# Patient Record
Sex: Female | Born: 1955 | Race: Black or African American | Hispanic: No | Marital: Single | Smoking: Former smoker
Health system: Southern US, Community
[De-identification: ages and names within clinical notes are randomized; demographics above are authoritative.]

## PROBLEM LIST (undated history)

## (undated) DIAGNOSIS — E785 Hyperlipidemia, unspecified: Secondary | ICD-10-CM

## (undated) DIAGNOSIS — R51 Headache: Secondary | ICD-10-CM

## (undated) DIAGNOSIS — I10 Essential (primary) hypertension: Secondary | ICD-10-CM

## (undated) DIAGNOSIS — E119 Type 2 diabetes mellitus without complications: Secondary | ICD-10-CM

## (undated) DIAGNOSIS — R519 Headache, unspecified: Secondary | ICD-10-CM

## (undated) DIAGNOSIS — I2510 Atherosclerotic heart disease of native coronary artery without angina pectoris: Secondary | ICD-10-CM

## (undated) DIAGNOSIS — I509 Heart failure, unspecified: Secondary | ICD-10-CM

## (undated) DIAGNOSIS — K219 Gastro-esophageal reflux disease without esophagitis: Secondary | ICD-10-CM

## (undated) HISTORY — PX: ABDOMINAL HYSTERECTOMY: SHX81

## (undated) HISTORY — PX: CARDIAC CATHETERIZATION: SHX172

---

## 2005-05-20 ENCOUNTER — Inpatient Hospital Stay (HOSPITAL_COMMUNITY): Admission: AD | Admit: 2005-05-20 | Discharge: 2005-05-24 | Payer: Self-pay | Admitting: Internal Medicine

## 2005-06-22 ENCOUNTER — Encounter: Admission: RE | Admit: 2005-06-22 | Discharge: 2005-09-20 | Payer: Self-pay | Admitting: Internal Medicine

## 2005-08-11 ENCOUNTER — Encounter: Admission: RE | Admit: 2005-08-11 | Discharge: 2005-08-11 | Payer: Self-pay | Admitting: Family Medicine

## 2005-12-01 ENCOUNTER — Ambulatory Visit (HOSPITAL_COMMUNITY): Admission: RE | Admit: 2005-12-01 | Discharge: 2005-12-01 | Payer: Self-pay | Admitting: Gastroenterology

## 2005-12-01 ENCOUNTER — Encounter (INDEPENDENT_AMBULATORY_CARE_PROVIDER_SITE_OTHER): Payer: Self-pay | Admitting: *Deleted

## 2010-07-01 ENCOUNTER — Emergency Department (HOSPITAL_COMMUNITY): Admission: EM | Admit: 2010-07-01 | Discharge: 2010-07-01 | Payer: Self-pay | Admitting: Emergency Medicine

## 2011-02-06 LAB — POCT I-STAT, CHEM 8
BUN: 23 mg/dL (ref 6–23)
Calcium, Ion: 1.17 mmol/L (ref 1.12–1.32)
Chloride: 103 mEq/L (ref 96–112)
Creatinine, Ser: 0.9 mg/dL (ref 0.4–1.2)
Glucose, Bld: 357 mg/dL — ABNORMAL HIGH (ref 70–99)
HCT: 48 % — ABNORMAL HIGH (ref 36.0–46.0)
Hemoglobin: 16.3 g/dL — ABNORMAL HIGH (ref 12.0–15.0)
Potassium: 3.9 mEq/L (ref 3.5–5.1)
Sodium: 136 mEq/L (ref 135–145)
TCO2: 26 mmol/L (ref 0–100)

## 2011-02-06 LAB — GLUCOSE, CAPILLARY: Glucose-Capillary: 351 mg/dL — ABNORMAL HIGH (ref 70–99)

## 2011-04-10 NOTE — H&P (Signed)
[name], Roberts NO.:  [id number]   MEDICAL RECORD NO.:  [id number]          PATIENT TYPE:  INP   LOCATION:  3707                         FACILITY:  [facility]   PHYSICIAN:  Renato Battles, M.D.     DATE OF BIRTH:  03-05-1956   DATE OF ADMISSION:  05/20/2005  DATE OF DISCHARGE:                                HISTORY & PHYSICAL   REASON FOR ADMISSION:  Nausea, hyperglycemia, hypertension.   PRIMARY CARE PHYSICIAN:  Talmadge Coventry, M.D.   HISTORY OF PRESENT ILLNESS:  The patient is a very pleasant 55 year old  African-American female who has not been seeing a doctor for a long time,  recently about 10 days ago she went to see Dr. Smith Mince for the first time  regarding infected left middle finger.  At that time she was noted to have  hypertension and she was started on hydrochlorothiazide for that as well as  Celexa for that infection.  She comes back to see Dr. Smith Mince today and  she was noted to have a blood sugar of 606, blood pressure 190/100.  The  patient reports not feeling well in general, being nauseous but not much  vomiting.  She has lost about 8 pounds over the course of the last 10 days  according to the measurement in Dr. Stephannie Peters office, however, the patient  says that she has been losing weight for a longer period of time which she  attributed to stress from work.  She reports having polydipsia and polyuria,  having to wake up three to four times a night to urinate.  No dysuria, no  chest pain, no shortness of breath.  No orthopnea or PND.  No diarrhea or  constipation.  No hematuria.  No other complaints.   REVIEW OF SYSTEMS:  Otherwise negative.   PAST MEDICAL HISTORY:  These are all new issues, recently diagnosed.  1.  Hypertension.  2.  Diabetes.  3.  Obesity.  4.  Hyponatremia as reported by Dr. Stephannie Peters office, although I do not      have a confirmation lab here yet.  5.  Possible UTI.   PAST SURGICAL HISTORY:  The patient  had partial hysterectomy in 1993.  Also  she had cesarean section.   SOCIAL HISTORY:  She lives with her daughter.  She works in [employer].  She denies alcohol and denies drugs.  She does smoke on and  off, she reports that one pack of cigarettes last her for 2 to 3 weeks, but  has been doing that for years and years. She is very ambulatory, no problems  with mobility.   FAMILY HISTORY:  Positive for diabetes and cancer, mostly breast cancer.   ALLERGIES:  SULFA, SHELLFISH.   CURRENT MEDICATIONS:  1.  Hydrochlorothiazide 25 mg p.o. daily.  2.  Celexa 500 mg p.o. b.i.d.   PHYSICAL EXAMINATION:  GENERAL:  The patient is alert and oriented x3, very  well dressed and appropriate.  No acute distress.  VITAL SIGNS:  Temperature 98.9, heart rate 96, respiratory rate 20, blood  pressure 196/124.  Initial CBG  greater than 450.  HEENT:  Head is normocephalic and atraumatic.  Pupils equal, round, and  reactive to light and accommodation.  Extraocular movements intact  bilaterally.  NECK:  No lymphadenopathy, no thyromegaly, no JVD, no carotid bruits.  CHEST:  Clear to auscultation bilaterally.  No wheezing, rales, or rhonchi.  HEART:  Regular rate and tachycardia.  No murmurs.  ABDOMEN:  Soft, nontender, and nondistended.  Normal active bowel sounds.  EXTREMITIES:  No cyanosis, clubbing, or edema.   STUDIES:  No studies available at this time.   ASSESSMENT:  1.  Newly diagnosed diabetes with severe hyperglycemia.  At this time the      patient does not show any signs of hyperosmolar diabetic coma or DKA.  I      am going to start the patient on Glucomander and get her blood sugar      under control.  She is going to be on a diabetic diet.  Also diabetic      education and counseling will be obtained for her.  Once blood sugar is      under control, we will switch her to oral agents and sliding scale.  2.  Hypertension.  This partially related to chronic hypertension  and      partially to stress.  I am going to continue the patient on      hydrochlorothiazide and start her on Lisinopril 40 mg p.o. daily.  Blood      pressure will be checked q.4h and Hydralazine will be used for p.r.n.      coverage to keep blood pressure below 160 systolic and below 95      diastolic.  3.  Nausea.  This is very nonspecific and I am going to treat her      symptomatically.  4.  Weight loss.  Again I think this is related to the new onset diabetes.      I am just going to monitor the patient.  5.  Polyuria, polydipsia.  Again related to hyperglycemia.  I am going to      replace the patient's fluids with some IV fluids, but just monitor her      volume status.  6.  Tachycardia and feeling bag.  Given the patient's history of diabetes, I      am going to check cardiac enzymes and EKG.  7.  No health maintenance.  I am going to check fasting lipids, TSH, and      hemoglobin A1C.  8.  Hyponatremia.  This is probably pseudohyponatremia secondary to severe      hyperglycemia.  I am just going to recheck as blood sugars getting under      control.  9.  Possible urinary tract infection.  I am going to check urinalysis before      any treatment.       SA/MEDQ  D:  05/20/2005  T:  05/20/2005  Job:  [id number]   cc:   Talmadge Coventry, M.D.  [address]  [place]  [place] [address]  Fax: [phone]

## 2011-04-10 NOTE — Discharge Summary (Signed)
Rhonda Roberts, Rhonda Roberts NO.:  [id number]   MEDICAL RECORD NO.:  [id number]          PATIENT TYPE:  INP   LOCATION:  6707                         FACILITY:  [facility]   PHYSICIAN:  Michaelyn Barter, M.D. DATE OF BIRTH:  [birth year]   DATE OF ADMISSION:  05/20/2005  DATE OF DISCHARGE:  05/24/2005                                 DISCHARGE SUMMARY   PRIMARY CARE PHYSICIAN:  Katha Hamming, M.D.   FINAL DIAGNOSES:  1.  Newly diagnosed diabetes mellitus.  2.  Uncontrolled hypertension.  3.  Hypokalemia.  4.  Hyponatremia  5.  Hyperlipidemia.   HISTORY OF PRESENT ILLNESS:  Ms. Blatz is a 55 year old female who arrived  in the emergency room with a chief complaint of nausea. She stated that 10  days prior to her visit in the ER she saw her primary care physician for  evaluation of an infected left middle finger. Her blood pressure was noted  to be elevated and she was started on hydrochlorothiazide as well as Celexa.  She later went back to see her primary care doctor and it felt that her  glucose was elevated at 606 and her blood pressure likewise of elevated  190/100. The patient also complained of eight pounds weight loss  approximately 10 days prior to this admission. She also complained of some  polydipsia and polyuria.   PAST MEDICAL HISTORY:  1.  Hypertension.  2.  Diabetes mellitus.  3.  Obesity.  4.  Hyponatremia.  5.  Urinary tract infection   PAST SURGICAL HISTORY:  Partial hysterectomy in 1993.   SOCIAL HISTORY:  Cigarettes: The patient smokes on and off. Alcohol:  The  patient denies.   FAMILY HISTORY:  Positive for diabetes and cancer.   ALLERGIES:  SULFA and SHELLFISH.   HOSPITAL COURSE:  Problem 1:  NEWLY DIAGNOSED DIABETES MELLITUS WITH SEVERE  HYPERGLYCEMIA:  The patient's glucose was noted to have been elevated during  her initial presentation. Over the course of her hospitalization, she was  started on a sliding scale insulin and also  Lantus insulin which was given  at night time. When she visited her primary care physician, sugar was noted  to have been 606. Over the course of her hospitalization, her sugars  although they have declined secondary to the institution of Lantus there  have not been controlled. Lantus was initially started at 10 units q.h.s. As  of today, the dose has been titrated up to 26 units q.h.s. The patient's  sugars still remained in the low 200 range. There will have to be an  additional adjustment of the patient's medications by her primary care  physician once the patient is discharge from the hospital to achieve optimal  control of the patient's sugars. Likewise, the patient had a hemoglobin A1c  completed on May 20, 2005 the result of which was 14.4. The patient's  symptoms of nausea and vomiting have resolved, however.   Problem 2:  HYPERLIPIDEMIA:  The patient had a fasting lipid profile  completed on May 21, 2005, the results of which were a cholesterol of 228,  triglycerides 173,  HDL of 33, and LDL of 160; of course those results above  the recommend goals. Therefore, the patient has subsequently been started on  Zocor. In addition, she has also been started on an aspirin for cardiac  protection.   Problem 3:  HYPOKALEMIA:  During the patient's hospitalization, her  potassium was noted to have declined to 2.9. She has received multiple  supplementations for that and as of today her potassium level is 4.2 which  was completely within the normal range. In addition, the patient's blood  pressure at the time of admission was noted to have been elevated.  Subsequently, she was started on antihypertensive medications consisting  initially of hydrochlorothiazide but then lisinopril was added for optimal  blood pressure control and also to protect kidneys from diabetic  nephropathy. The patient's blood pressure has remained controlled secondary  to the addition of the lisinopril.   Problem  4:  HYPONATREMIA:  When the patient initially arrived in the  hospital, her sodium was noted to be 131. Over the course of  hospitalization, her sodium has increased to a normal low 135. Condition at  the time of discharge is improved. The patient states that she feels better  today and feels as though she is ready to be discharged home. Therefore, the  patient will be discharged home on the following medications.   DISCHARGE MEDICATIONS:  1.  Lantus insulin 30 units subcutaneously before bedtime.  2.  Lisinopril 40 milligrams p.o. q.d.  3.  Hydrochlorothiazide 25 milligrams p.o. q.d.  4.  Zocor 20 milligrams p.o. q.d.  5.  Aspirin 81 milligrams q.d.   DISCHARGE INSTRUCTIONS:  She is instructed to follow up with her primary  care doctor within 1-2 weeks for further evaluation and also for additional  adjustments to the patient's medications as although her glucose is  decreased from that result at the time of admission it is still not an  optimal number. Therefore, the patient will need adjustment in her  medications.       OR/MEDQ  D:  05/24/2005  T:  05/24/2005  Job:  [id number]   cc:   Katha Hamming, M.D.

## 2013-05-20 ENCOUNTER — Emergency Department (HOSPITAL_COMMUNITY)
Admission: EM | Admit: 2013-05-20 | Discharge: 2013-05-20 | Disposition: A | Discharge status: Home / Self Care | Payer: Managed Care, Other (non HMO) | Attending: Emergency Medicine | Admitting: Emergency Medicine

## 2013-05-20 ENCOUNTER — Emergency Department (HOSPITAL_COMMUNITY): Payer: Managed Care, Other (non HMO)

## 2013-05-20 ENCOUNTER — Encounter (HOSPITAL_COMMUNITY): Payer: Self-pay | Admitting: Emergency Medicine

## 2013-05-20 DIAGNOSIS — Z862 Personal history of diseases of the blood and blood-forming organs and certain disorders involving the immune mechanism: Secondary | ICD-10-CM | POA: Insufficient documentation

## 2013-05-20 DIAGNOSIS — Z8639 Personal history of other endocrine, nutritional and metabolic disease: Secondary | ICD-10-CM | POA: Insufficient documentation

## 2013-05-20 DIAGNOSIS — I10 Essential (primary) hypertension: Secondary | ICD-10-CM | POA: Insufficient documentation

## 2013-05-20 DIAGNOSIS — R109 Unspecified abdominal pain: Secondary | ICD-10-CM | POA: Insufficient documentation

## 2013-05-20 DIAGNOSIS — R110 Nausea: Secondary | ICD-10-CM | POA: Insufficient documentation

## 2013-05-20 DIAGNOSIS — E119 Type 2 diabetes mellitus without complications: Secondary | ICD-10-CM | POA: Insufficient documentation

## 2013-05-20 HISTORY — DX: Type 2 diabetes mellitus without complications: E11.9

## 2013-05-20 LAB — COMPREHENSIVE METABOLIC PANEL
ALT: 16 U/L (ref 0–35)
AST: 16 U/L (ref 0–37)
Albumin: 3.8 g/dL (ref 3.5–5.2)
Alkaline Phosphatase: 100 U/L (ref 39–117)
BUN: 23 mg/dL (ref 6–23)
CO2: 27 mEq/L (ref 19–32)
Calcium: 10 mg/dL (ref 8.4–10.5)
Chloride: 101 mEq/L (ref 96–112)
Creatinine, Ser: 0.9 mg/dL (ref 0.50–1.10)
GFR calc Af Amer: 81 mL/min — ABNORMAL LOW (ref >90)
GFR calc non Af Amer: 70 mL/min — ABNORMAL LOW (ref >90)
Glucose, Bld: 138 mg/dL — ABNORMAL HIGH (ref 70–99)
Potassium: 3.9 mEq/L (ref 3.5–5.1)
Sodium: 138 mEq/L (ref 135–145)
Total Bilirubin: 0.3 mg/dL (ref 0.3–1.2)
Total Protein: 8.4 g/dL — ABNORMAL HIGH (ref 6.0–8.3)

## 2013-05-20 LAB — CBC WITH DIFFERENTIAL/PLATELET
Basophils Absolute: 0 10*3/uL (ref 0.0–0.1)
Basophils Relative: 0 % (ref 0–1)
Eosinophils Absolute: 0.3 10*3/uL (ref 0.0–0.7)
Eosinophils Relative: 3 % (ref 0–5)
HCT: 36.6 % (ref 36.0–46.0)
Hemoglobin: 12.3 g/dL (ref 12.0–15.0)
Lymphocytes Relative: 26 % (ref 12–46)
Lymphs Abs: 2.9 10*3/uL (ref 0.7–4.0)
MCH: 28.8 pg (ref 26.0–34.0)
MCHC: 33.6 g/dL (ref 30.0–36.0)
MCV: 85.7 fL (ref 78.0–100.0)
Monocytes Absolute: 0.5 10*3/uL (ref 0.1–1.0)
Monocytes Relative: 5 % (ref 3–12)
Neutro Abs: 7.5 10*3/uL (ref 1.7–7.7)
Neutrophils Relative %: 66 % (ref 43–77)
Platelets: 271 10*3/uL (ref 150–400)
RBC: 4.27 MIL/uL (ref 3.87–5.11)
RDW: 14.5 % (ref 11.5–15.5)
WBC: 11.3 10*3/uL — ABNORMAL HIGH (ref 4.0–10.5)

## 2013-05-20 LAB — URINALYSIS, ROUTINE W REFLEX MICROSCOPIC
Bilirubin Urine: NEGATIVE
Glucose, UA: NEGATIVE mg/dL
Hgb urine dipstick: NEGATIVE
Ketones, ur: NEGATIVE mg/dL
Nitrite: NEGATIVE
Protein, ur: NEGATIVE mg/dL
Specific Gravity, Urine: 1.016 (ref 1.005–1.030)
Urobilinogen, UA: 0.2 mg/dL (ref 0.0–1.0)
pH: 5.5 (ref 5.0–8.0)

## 2013-05-20 LAB — URINE MICROSCOPIC-ADD ON

## 2013-05-20 LAB — LIPASE, BLOOD: Lipase: 29 U/L (ref 11–59)

## 2013-05-20 MED ORDER — MORPHINE SULFATE 4 MG/ML IJ SOLN
4.0000 mg | Freq: Once | INTRAMUSCULAR | Status: AC
  Administered 2013-05-20: 4 mg via INTRAVENOUS
  Filled 2013-05-20: qty 1

## 2013-05-20 MED ORDER — IOHEXOL 300 MG/ML  SOLN
50.0000 mL | Freq: Once | INTRAMUSCULAR | Status: AC | PRN
  Administered 2013-05-20: 50 mL via ORAL

## 2013-05-20 MED ORDER — ONDANSETRON HCL 4 MG/2ML IJ SOLN
4.0000 mg | Freq: Once | INTRAMUSCULAR | Status: AC
  Administered 2013-05-20: 4 mg via INTRAVENOUS
  Filled 2013-05-20: qty 2

## 2013-05-20 MED ORDER — IOHEXOL 300 MG/ML  SOLN
100.0000 mL | Freq: Once | INTRAMUSCULAR | Status: AC | PRN
  Administered 2013-05-20: 100 mL via INTRAVENOUS

## 2013-05-20 MED ORDER — HYDROCODONE-ACETAMINOPHEN 5-325 MG PO TABS: 2.0000 | ORAL_TABLET | Freq: Four times a day (QID) | ORAL | Status: DC | PRN

## 2013-05-20 MED ORDER — PROMETHAZINE HCL 25 MG PO TABS: 25.0000 mg | ORAL_TABLET | Freq: Four times a day (QID) | ORAL | Status: DC | PRN

## 2013-05-20 NOTE — ED Notes (Signed)
Patient up to bathroom for urine sample

## 2013-05-20 NOTE — ED Provider Notes (Signed)
History    CSN: [id number] Arrival date & time 05/20/13  1454  First MD Initiated Contact with Patient 05/20/13 1634     Chief Complaint  Patient presents with  . Abdominal Pain   (Consider location/radiation/quality/duration/timing/severity/associated sxs/prior Treatment) HPI Comments: Patient is a 57 year old female with history of diabetes, hypertension, hyperlipidemia who presents today with 3 days of gradually worsening throbbing upper abdomen no pain. It is worse on her left side with radiation to her back. The pain also radiates into her right side. It is associated with nausea. No vomiting, diarrhea, constipation. Last bowel movement was this morning. Her diabetes is well controlled. She measures her blood sugars twice daily and states that her sugars are generally in the low 100s. She has never had pain like this before. No medications prior to arrival. No fevers, but she states she has been cold for the past 3 days.   The history is provided by the patient. No language interpreter was used.   Past Medical History  Diagnosis Date  . Diabetes mellitus without complication    Past Surgical History  Procedure Laterality Date  . Abdominal hysterectomy     No family history on file. History  Substance Use Topics  . Smoking status: Never Smoker   . Smokeless tobacco: Not on file  . Alcohol Use: No   OB History   Grav Para Term Preterm Abortions TAB SAB Ect Mult Living                 Review of Systems  Constitutional: Negative for fever and chills.  Respiratory: Negative for shortness of breath.   Cardiovascular: Negative for chest pain.  Gastrointestinal: Positive for nausea and abdominal pain. Negative for vomiting, diarrhea and constipation.  All other systems reviewed and are negative.    Allergies  Sulfa antibiotics  Home Medications  No current outpatient prescriptions on file. BP 143/72  Pulse 88  Temp(Src) 98.4 F (36.9 C) (Oral)  Resp 19  Ht 5\' 2"   (1.575 m)  Wt 274 lb (124.286 kg)  BMI 50.1 kg/m2  SpO2 98% Physical Exam  Nursing note and vitals reviewed. Constitutional: She is oriented to person, place, and time. She appears well-developed and well-nourished. No distress.  HENT:  Head: Normocephalic and atraumatic.  Right Ear: External ear normal.  Left Ear: External ear normal.  Nose: Nose normal.  Mouth/Throat: Oropharynx is clear and moist.  Eyes: Conjunctivae are normal.  Neck: Normal range of motion.  Cardiovascular: Normal rate, regular rhythm and normal heart sounds.   Pulmonary/Chest: Effort normal and breath sounds normal. No stridor. No respiratory distress. She has no wheezes. She has no rales.  Abdominal: Soft. She exhibits no distension. There is tenderness in the right upper quadrant, epigastric area and left upper quadrant. There is no rigidity, no rebound and no guarding.  Musculoskeletal: Normal range of motion.  Neurological: She is alert and oriented to person, place, and time. She has normal strength.  Skin: Skin is warm and dry. She is not diaphoretic. No erythema.  Psychiatric: She has a normal mood and affect. Her behavior is normal.    ED Course  Procedures (including critical care time) Labs Reviewed  CBC WITH DIFFERENTIAL - Abnormal; Notable for the following:    WBC 11.3 (*)    All other components within normal limits  COMPREHENSIVE METABOLIC PANEL - Abnormal; Notable for the following:    Glucose, Bld 138 (*)    Total Protein 8.4 (*)  GFR calc non Af Amer 70 (*)    GFR calc Af Amer 81 (*)    All other components within normal limits  URINALYSIS, ROUTINE W REFLEX MICROSCOPIC - Abnormal; Notable for the following:    Leukocytes, UA SMALL (*)    All other components within normal limits  URINE MICROSCOPIC-ADD ON - Abnormal; Notable for the following:    Bacteria, UA MANY (*)    All other components within normal limits  LIPASE, BLOOD   Ct Abdomen Pelvis W Contrast  05/20/2013    *RADIOLOGY REPORT*  Clinical Data: Generalized abdominal pain for 3 days  CT ABDOMEN AND PELVIS WITH CONTRAST  Technique:  Multidetector CT imaging of the abdomen and pelvis was performed following the standard protocol during bolus administration of intravenous contrast.  Contrast: 50mL OMNIPAQUE IOHEXOL 300 MG/ML  SOLN, OMNIPAQUE IOHEXOL 300 MG/ML  SOLN  Comparison: None.  Findings:  Normal hepatic contour.  No discrete hepatic lesions.  Normal appearance of the gallbladder.  No intra or extrahepatic biliary duct dilatation.  No ascites.  There is symmetric enhancement and excretion of the bilateral kidneys.  Incidental note is made of a slightly exaggerated horizontal line of the right kidney.  No discrete renal lesions. No renal stones urinary obstruction.  No perinephric stranding. Normal appearance of the bilateral adrenal glands, pancreas and spleen.  Ingested enteric contrast extends to the level of the proximal descending colon.  Scattered colonic diverticulosis without evidence of diverticulitis.  The bowel is otherwise normal in course and caliber without wall thickening or evidence of obstruction.  Normal appearance of the appendix.  No pneumoperitoneum, pneumatosis or portal venous gas.  Scattered minimal atherosclerotic plaque within a mildly tortuous but normal caliber abdominal aorta.  No retroperitoneal, mesenteric, pelvic or inguinal lymphadenopathy.  Post hysterectomy.  No discrete adnexal lesion.  No free fluid in the pelvis.  Limited visualization of the lower thorax is negative for focal airspace opacity or pleural effusion.  No acute or aggressive osseous abnormalities.  Mild multilevel lumbar spine DDD, likely worse at L3 - L4.  There is minimal subcutaneous stranding in the right lower abdominal quadrant, presumably at the site of prior subcutaneous injections.  Note is made of a small mesenteric fat containing periumbilical hernia.  IMPRESSION: No explanation for patient's generalized  abdominal pain. Specifically, no evidence of enteric or urinary obstruction.   Original Report Authenticated By: Tacey Ruiz, MD   1. Abdominal pain     MDM  Patient is nontoxic, nonseptic appearing, in no apparent distress.  Patient's pain and other symptoms adequately managed in emergency department. Labs, imaging and vitals reviewed.  Patient does not meet the SIRS or Sepsis criteria.  On repeat exam patient does not have a surgical abdomin and there are nor peritoneal signs.  No indication of appendicitis, bowel obstruction, bowel perforation, cholecystitis, diverticulitis.  Patient discharged home with symptomatic treatment and given strict instructions for follow-up with their primary care physician.  I have also discussed reasons to return immediately to the ER.  Patient expresses understanding and agrees with plan. Discussed case with Dr. Oletta Lamas who agrees with plan.      Mora Bellman, PA-C 05/21/13 [phone]

## 2013-05-20 NOTE — ED Notes (Signed)
Pt states she has been having gen abd pain x 3 days.  Denies NVD.

## 2013-05-21 NOTE — ED Provider Notes (Signed)
Medical screening examination/treatment/procedure(s) were performed by non-physician practitioner and as supervising physician I was immediately available for consultation/collaboration.   Gavin Pound. Verlon Carcione, MD 05/21/13 0040

## 2013-05-24 ENCOUNTER — Encounter (HOSPITAL_COMMUNITY): Payer: Self-pay | Admitting: Emergency Medicine

## 2013-05-24 ENCOUNTER — Emergency Department (HOSPITAL_COMMUNITY)
Admission: EM | Admit: 2013-05-24 | Discharge: 2013-05-24 | Disposition: A | Discharge status: Home / Self Care | Payer: Managed Care, Other (non HMO) | Attending: Emergency Medicine | Admitting: Emergency Medicine

## 2013-05-24 DIAGNOSIS — Z794 Long term (current) use of insulin: Secondary | ICD-10-CM | POA: Insufficient documentation

## 2013-05-24 DIAGNOSIS — Z79899 Other long term (current) drug therapy: Secondary | ICD-10-CM | POA: Insufficient documentation

## 2013-05-24 DIAGNOSIS — R112 Nausea with vomiting, unspecified: Secondary | ICD-10-CM | POA: Insufficient documentation

## 2013-05-24 DIAGNOSIS — B029 Zoster without complications: Secondary | ICD-10-CM | POA: Insufficient documentation

## 2013-05-24 DIAGNOSIS — E119 Type 2 diabetes mellitus without complications: Secondary | ICD-10-CM | POA: Insufficient documentation

## 2013-05-24 DIAGNOSIS — Z7982 Long term (current) use of aspirin: Secondary | ICD-10-CM | POA: Insufficient documentation

## 2013-05-24 MED ORDER — VALACYCLOVIR HCL 1 G PO TABS: 1000.0000 mg | ORAL_TABLET | Freq: Three times a day (TID) | ORAL | Status: DC

## 2013-05-24 MED ORDER — ONDANSETRON 8 MG PO TBDP: ORAL_TABLET | ORAL | Status: DC

## 2013-05-24 MED ORDER — OXYCODONE-ACETAMINOPHEN 5-325 MG PO TABS: 2.0000 | ORAL_TABLET | ORAL | Status: DC | PRN

## 2013-05-24 NOTE — Progress Notes (Signed)
Prior to d/c pt confirmed pcp as Kayren Eaves, NP at [facility] EPIC updated

## 2013-05-24 NOTE — ED Notes (Addendum)
Pt reports being seen on 6/28 for abdominal pain. Pt developed a rash and raised bumps that are fluid filled that "itch, burn, and ache." Pt reports only having the rash and bumps around her trunk and back area. NAD.

## 2013-05-24 NOTE — ED Provider Notes (Signed)
History    CSN: [id number] Arrival date & time 05/24/13  1053  First MD Initiated Contact with Patient 05/24/13 1119     Chief Complaint  Patient presents with  . Skin Problem   (Consider location/radiation/quality/duration/timing/severity/associated sxs/prior Treatment) HPI Comments: Patient presents with a rash to her lower abdomen. She states she started feeling 1018 2 days ago and then yesterday noted some blisters starting. She states the rash starts in her abdomen and radiates to her left back. She's had some nausea and vomiting associated with the pain. She states it's a constant burning pain but intensifies at times. She's had no fevers or chills.  Past Medical History  Diagnosis Date  . Diabetes mellitus without complication    Past Surgical History  Procedure Laterality Date  . Abdominal hysterectomy     No family history on file. History  Substance Use Topics  . Smoking status: Never Smoker   . Smokeless tobacco: Never Used  . Alcohol Use: No   OB History   Grav Para Term Preterm Abortions TAB SAB Ect Mult Living                 Review of Systems  Constitutional: Negative for fever, chills, diaphoresis and fatigue.  HENT: Negative for congestion, rhinorrhea and sneezing.   Eyes: Negative.   Respiratory: Negative for cough, chest tightness and shortness of breath.   Cardiovascular: Negative for chest pain and leg swelling.  Gastrointestinal: Positive for nausea and vomiting. Negative for abdominal pain, diarrhea and blood in stool.  Genitourinary: Negative for frequency, hematuria, flank pain and difficulty urinating.  Musculoskeletal: Negative for back pain and arthralgias.  Skin: Positive for rash.  Neurological: Negative for dizziness, speech difficulty, weakness, numbness and headaches.    Allergies  Sulfa antibiotics  Home Medications   Current Outpatient Rx  Name  Route  Sig  Dispense  Refill  . aspirin EC 81 MG tablet   Oral   Take 81 mg by  mouth every morning.         Marland Kitchen atorvastatin (LIPITOR) 20 MG tablet   Oral   Take 20 mg by mouth every evening.         . Cholecalciferol (VITAMIN D) 2000 UNITS CAPS   Oral   Take 1 capsule by mouth every morning.         Marland Kitchen HYDROcodone-acetaminophen (NORCO/VICODIN) 5-325 MG per tablet   Oral   Take 2 tablets by mouth every 6 (six) hours as needed for pain.   6 tablet   0   . insulin glargine (LANTUS) 100 UNIT/ML injection   Subcutaneous   Inject 30-45 Units into the skin 2 (two) times daily. SSI: based on blood sugar levels         . Multiple Vitamin (MULTIVITAMIN WITH MINERALS) TABS   Oral   Take 1 tablet by mouth every morning.         . Olmesartan-Amlodipine-HCTZ (TRIBENZOR) 40-5-25 MG TABS   Oral   Take 1 tablet by mouth every evening.         . ondansetron (ZOFRAN ODT) 8 MG disintegrating tablet      8mg  ODT q4 hours prn nausea   10 tablet   0   . oxyCODONE-acetaminophen (PERCOCET) 5-325 MG per tablet   Oral   Take 2 tablets by mouth every 4 (four) hours as needed for pain.   20 tablet   0   . promethazine (PHENERGAN) 25 MG tablet   Oral  Take 1 tablet (25 mg total) by mouth every 6 (six) hours as needed for nausea.   12 tablet   0   . SitaGLIPtin-MetFORMIN HCl [phone] MG TB24   Oral   Take 1 tablet by mouth every evening.         . valACYclovir (VALTREX) 1000 MG tablet   Oral   Take 1 tablet (1,000 mg total) by mouth 3 (three) times daily.   21 tablet   0    BP 141/88  Pulse 97  Temp(Src) 98.5 F (36.9 C) (Oral)  Resp 18  SpO2 99% Physical Exam  Constitutional: She is oriented to person, place, and time. She appears well-developed and well-nourished.  HENT:  Head: Normocephalic and atraumatic.  Eyes: Pupils are equal, round, and reactive to light.  Neck: Normal range of motion. Neck supple.  Cardiovascular: Normal rate, regular rhythm and normal heart sounds.   Pulmonary/Chest: Effort normal and breath sounds normal. No  respiratory distress. She has no wheezes. She has no rales. She exhibits no tenderness.  Abdominal: Soft. Bowel sounds are normal. There is no tenderness. There is no rebound and no guarding.  Musculoskeletal: Normal range of motion. She exhibits no edema.  Lymphadenopathy:    She has no cervical adenopathy.  Neurological: She is alert and oriented to person, place, and time.  Skin: Skin is warm and dry. Rash noted.  Patient has a vesicular erythematous rash to her left midabdomen it radiates around to her left mid back. It did not cross the midline. It appears to be consistent with shingles. There's no purulent discharge or other signs of infection.  Psychiatric: She has a normal mood and affect.    ED Course  Procedures (including critical care time) Labs Reviewed - No data to display No results found. 1. Shingles     MDM  Patient was started on Valtrex.  She was previously taking hydrocodone which does not seem to be helping with the pain. I gave her prescription for oxycodone. I advised her to take his stool soft with it. I also gave her a prescription for Zofran for nausea. Advised her followup with her primary care physician within the next few days for recheck. I advised her not to take hydrocodone along with oxycodone.  Rolan Bucco, MD 05/24/13 1151

## 2013-12-13 ENCOUNTER — Other Ambulatory Visit: Payer: Self-pay | Admitting: Family

## 2013-12-13 ENCOUNTER — Ambulatory Visit
Admission: RE | Admit: 2013-12-13 | Discharge: 2013-12-13 | Disposition: A | Discharge status: Home / Self Care | Payer: Managed Care, Other (non HMO) | Source: Ambulatory Visit | Attending: Family | Admitting: Family

## 2013-12-13 DIAGNOSIS — M25562 Pain in left knee: Secondary | ICD-10-CM

## 2013-12-13 DIAGNOSIS — M549 Dorsalgia, unspecified: Secondary | ICD-10-CM

## 2013-12-13 DIAGNOSIS — M25561 Pain in right knee: Secondary | ICD-10-CM

## 2014-03-30 ENCOUNTER — Encounter (HOSPITAL_COMMUNITY): Payer: Self-pay | Admitting: Emergency Medicine

## 2014-03-30 ENCOUNTER — Emergency Department (HOSPITAL_COMMUNITY)
Admission: EM | Admit: 2014-03-30 | Discharge: 2014-03-30 | Disposition: A | Discharge status: Home / Self Care | Payer: Managed Care, Other (non HMO) | Source: Home / Self Care | Attending: Emergency Medicine | Admitting: Emergency Medicine

## 2014-03-30 DIAGNOSIS — N390 Urinary tract infection, site not specified: Secondary | ICD-10-CM

## 2014-03-30 LAB — POCT URINALYSIS DIP (DEVICE)
Bilirubin Urine: NEGATIVE
Glucose, UA: NEGATIVE mg/dL
Ketones, ur: NEGATIVE mg/dL
Nitrite: NEGATIVE
Protein, ur: NEGATIVE mg/dL
Specific Gravity, Urine: 1.02 (ref 1.005–1.030)
Urobilinogen, UA: 0.2 mg/dL (ref 0.0–1.0)
pH: 6 (ref 5.0–8.0)

## 2014-03-30 MED ORDER — NITROFURANTOIN MONOHYD MACRO 100 MG PO CAPS: 100.0000 mg | ORAL_CAPSULE | Freq: Two times a day (BID) | ORAL | Status: DC

## 2014-03-30 NOTE — ED Provider Notes (Signed)
CSN: [id number]     Arrival date & time 03/30/14  1914 History   First MD Initiated Contact with Patient 03/30/14 1956     Chief Complaint  Patient presents with  . Urinary Tract Infection   (Consider location/radiation/quality/duration/timing/severity/associated sxs/prior Treatment) HPI Comments: Patient presents with a 3 day history of dysuria, bladder pressure post void. No fever or chills. No gross hematuria. No CVA or abdominal pain.   Patient is a 58 y.o. female presenting with urinary tract infection. The history is provided by the patient.  Urinary Tract Infection    Past Medical History  Diagnosis Date  . Diabetes mellitus without complication    Past Surgical History  Procedure Laterality Date  . Abdominal hysterectomy     No family history on file. History  Substance Use Topics  . Smoking status: Never Smoker   . Smokeless tobacco: Never Used  . Alcohol Use: No   OB History   Grav Para Term Preterm Abortions TAB SAB Ect Mult Living                 Review of Systems  All other systems reviewed and are negative.   Allergies  Sulfa antibiotics  Home Medications   Prior to Admission medications   Medication Sig Start Date End Date Taking? Authorizing Provider  aspirin EC 81 MG tablet Take 81 mg by mouth every morning.    Historical Provider, MD  atorvastatin (LIPITOR) 20 MG tablet Take 20 mg by mouth every evening.    Historical Provider, MD  Cholecalciferol (VITAMIN D) 2000 UNITS CAPS Take 1 capsule by mouth every morning.    Historical Provider, MD  HYDROcodone-acetaminophen (NORCO/VICODIN) 5-325 MG per tablet Take 2 tablets by mouth every 6 (six) hours as needed for pain. 05/20/13   Elwyn Lade, PA-C  insulin glargine (LANTUS) 100 UNIT/ML injection Inject 30-45 Units into the skin 2 (two) times daily. SSI: based on blood sugar levels    Historical Provider, MD  Multiple Vitamin (MULTIVITAMIN WITH MINERALS) TABS Take 1 tablet by mouth every morning.     Historical Provider, MD  Olmesartan-Amlodipine-HCTZ (TRIBENZOR) 40-5-25 MG TABS Take 1 tablet by mouth every evening.    Historical Provider, MD  ondansetron (ZOFRAN ODT) 8 MG disintegrating tablet 8mg  ODT q4 hours prn nausea 05/24/13   Malvin Johns, MD  oxyCODONE-acetaminophen (PERCOCET) 5-325 MG per tablet Take 2 tablets by mouth every 4 (four) hours as needed for pain. 05/24/13   Malvin Johns, MD  promethazine (PHENERGAN) 25 MG tablet Take 1 tablet (25 mg total) by mouth every 6 (six) hours as needed for nausea. 05/20/13   Elwyn Lade, PA-C  SitaGLIPtin-MetFORMIN HCl [phone] MG TB24 Take 1 tablet by mouth every evening.    Historical Provider, MD  valACYclovir (VALTREX) 1000 MG tablet Take 1 tablet (1,000 mg total) by mouth 3 (three) times daily. 05/24/13   Malvin Johns, MD   BP 165/87  Pulse 90  Temp(Src) 98.6 F (37 C) (Oral)  Resp 20  SpO2 98% Physical Exam  Nursing note and vitals reviewed. Constitutional: She is oriented to person, place, and time. She appears well-developed and well-nourished. No distress.  HENT:  Head: Normocephalic and atraumatic.  Cardiovascular: Normal rate and regular rhythm.   Pulmonary/Chest: Effort normal and breath sounds normal.  Abdominal: Soft. Bowel sounds are normal. She exhibits no distension. There is no rebound.  Neurological: She is alert and oriented to person, place, and time.  Skin: Skin is warm and dry.  Psychiatric: Her behavior is normal.    ED Course  Procedures (including critical care time) Labs Review Labs Reviewed  POCT URINALYSIS DIP (DEVICE) - Abnormal; Notable for the following:    Hgb urine dipstick TRACE (*)    Leukocytes, UA TRACE (*)    All other components within normal limits    Imaging Review No results found.   MDM   1. UTI (lower urinary tract infection)    Cover with antibiotics, push fluids. F/U if needed.     Bjorn Pippin, PA-C 03/30/14 2012

## 2014-03-30 NOTE — Discharge Instructions (Signed)
Urinary Tract Infection Urinary tract infections (UTIs) can develop anywhere along your urinary tract. Your urinary tract is your body's drainage system for removing wastes and extra water. Your urinary tract includes two kidneys, two ureters, a bladder, and a urethra. Your kidneys are a pair of bean-shaped organs. Each kidney is about the size of your fist. They are located below your ribs, one on each side of your spine. CAUSES Infections are caused by microbes, which are microscopic organisms, including fungi, viruses, and bacteria. These organisms are so small that they can only be seen through a microscope. Bacteria are the microbes that most commonly cause UTIs. SYMPTOMS  Symptoms of UTIs may vary by age and gender of the patient and by the location of the infection. Symptoms in [place] women typically include a frequent and intense urge to urinate and a painful, burning feeling in the bladder or urethra during urination. Older women and men are more likely to be tired, shaky, and weak and have muscle aches and abdominal pain. A fever may mean the infection is in your kidneys. Other symptoms of a kidney infection include pain in your back or sides below the ribs, nausea, and vomiting. DIAGNOSIS To diagnose a UTI, your caregiver will ask you about your symptoms. Your caregiver also will ask to provide a urine sample. The urine sample will be tested for bacteria and white blood cells. White blood cells are made by your body to help fight infection. TREATMENT  Typically, UTIs can be treated with medication. Because most UTIs are caused by a bacterial infection, they usually can be treated with the use of antibiotics. The choice of antibiotic and length of treatment depend on your symptoms and the type of bacteria causing your infection. HOME CARE INSTRUCTIONS  If you were prescribed antibiotics, take them exactly as your caregiver instructs you. Finish the medication even if you feel better after you  have only taken some of the medication.  Drink enough water and fluids to keep your urine clear or pale yellow.  Avoid caffeine, tea, and carbonated beverages. They tend to irritate your bladder.  Empty your bladder often. Avoid holding urine for long periods of time.  Empty your bladder before and after sexual intercourse.  After a bowel movement, women should cleanse from front to back. Use each tissue only once. SEEK MEDICAL CARE IF:   You have back pain.  You develop a fever.  Your symptoms do not begin to resolve within 3 days. SEEK IMMEDIATE MEDICAL CARE IF:   You have severe back pain or lower abdominal pain.  You develop chills.  You have nausea or vomiting.  You have continued burning or discomfort with urination. MAKE SURE YOU:   Understand these instructions.  Will watch your condition.  Will get help right away if you are not doing well or get worse. Document Released: 08/19/2005 Document Revised: 05/10/2012 Document Reviewed: 12/18/2011 [facility] Patient Information 2014 [place].   Drink lots of water, take all of antibiotics. Should you worsen or not improve please f/u with your PCP. Feel better.

## 2014-03-31 NOTE — ED Provider Notes (Signed)
Medical screening examination/treatment/procedure(s) were performed by non-physician practitioner and as supervising physician I was immediately available for consultation/collaboration.  Philipp Deputy, M.D.  Harden Mo, MD 03/31/14 [phone]

## 2015-02-28 ENCOUNTER — Emergency Department (HOSPITAL_COMMUNITY): Payer: BLUE CROSS/BLUE SHIELD

## 2015-02-28 ENCOUNTER — Emergency Department (HOSPITAL_COMMUNITY)
Admission: EM | Admit: 2015-02-28 | Discharge: 2015-02-28 | Disposition: A | Discharge status: Home / Self Care | Payer: BLUE CROSS/BLUE SHIELD | Attending: Emergency Medicine | Admitting: Emergency Medicine

## 2015-02-28 ENCOUNTER — Encounter (HOSPITAL_COMMUNITY): Payer: Self-pay | Admitting: Emergency Medicine

## 2015-02-28 DIAGNOSIS — Z79899 Other long term (current) drug therapy: Secondary | ICD-10-CM | POA: Insufficient documentation

## 2015-02-28 DIAGNOSIS — R109 Unspecified abdominal pain: Secondary | ICD-10-CM

## 2015-02-28 DIAGNOSIS — E119 Type 2 diabetes mellitus without complications: Secondary | ICD-10-CM | POA: Insufficient documentation

## 2015-02-28 DIAGNOSIS — R42 Dizziness and giddiness: Secondary | ICD-10-CM | POA: Diagnosis not present

## 2015-02-28 DIAGNOSIS — Z794 Long term (current) use of insulin: Secondary | ICD-10-CM | POA: Insufficient documentation

## 2015-02-28 DIAGNOSIS — N12 Tubulo-interstitial nephritis, not specified as acute or chronic: Secondary | ICD-10-CM | POA: Diagnosis not present

## 2015-02-28 DIAGNOSIS — Z90710 Acquired absence of both cervix and uterus: Secondary | ICD-10-CM | POA: Diagnosis not present

## 2015-02-28 DIAGNOSIS — Z7982 Long term (current) use of aspirin: Secondary | ICD-10-CM | POA: Insufficient documentation

## 2015-02-28 DIAGNOSIS — R1012 Left upper quadrant pain: Secondary | ICD-10-CM | POA: Diagnosis present

## 2015-02-28 LAB — CBC WITH DIFFERENTIAL/PLATELET
Basophils Absolute: 0 10*3/uL (ref 0.0–0.1)
Basophils Relative: 0 % (ref 0–1)
Eosinophils Absolute: 0.3 10*3/uL (ref 0.0–0.7)
Eosinophils Relative: 3 % (ref 0–5)
HCT: 38.5 % (ref 36.0–46.0)
Hemoglobin: 12.8 g/dL (ref 12.0–15.0)
Lymphocytes Relative: 26 % (ref 12–46)
Lymphs Abs: 3.1 10*3/uL (ref 0.7–4.0)
MCH: 28.1 pg (ref 26.0–34.0)
MCHC: 33.2 g/dL (ref 30.0–36.0)
MCV: 84.4 fL (ref 78.0–100.0)
Monocytes Absolute: 0.6 10*3/uL (ref 0.1–1.0)
Monocytes Relative: 5 % (ref 3–12)
Neutro Abs: 7.7 10*3/uL (ref 1.7–7.7)
Neutrophils Relative %: 66 % (ref 43–77)
Platelets: 286 10*3/uL (ref 150–400)
RBC: 4.56 MIL/uL (ref 3.87–5.11)
RDW: 14.7 % (ref 11.5–15.5)
WBC: 11.8 10*3/uL — ABNORMAL HIGH (ref 4.0–10.5)

## 2015-02-28 LAB — URINALYSIS, ROUTINE W REFLEX MICROSCOPIC
Bilirubin Urine: NEGATIVE
Glucose, UA: 100 mg/dL — AB
Ketones, ur: NEGATIVE mg/dL
Nitrite: NEGATIVE
Protein, ur: NEGATIVE mg/dL
Specific Gravity, Urine: 1.025 (ref 1.005–1.030)
Urobilinogen, UA: 0.2 mg/dL (ref 0.0–1.0)
pH: 5 (ref 5.0–8.0)

## 2015-02-28 LAB — COMPREHENSIVE METABOLIC PANEL
ALT: 19 U/L (ref 0–35)
AST: 22 U/L (ref 0–37)
Albumin: 4.1 g/dL (ref 3.5–5.2)
Alkaline Phosphatase: 97 U/L (ref 39–117)
Anion gap: 11 (ref 5–15)
BUN: 29 mg/dL — ABNORMAL HIGH (ref 6–23)
CO2: 24 mmol/L (ref 19–32)
Calcium: 9.9 mg/dL (ref 8.4–10.5)
Chloride: 101 mmol/L (ref 96–112)
Creatinine, Ser: 1.06 mg/dL (ref 0.50–1.10)
GFR calc Af Amer: 65 mL/min — ABNORMAL LOW (ref >90)
GFR calc non Af Amer: 56 mL/min — ABNORMAL LOW (ref >90)
Glucose, Bld: 283 mg/dL — ABNORMAL HIGH (ref 70–99)
Potassium: 4.2 mmol/L (ref 3.5–5.1)
Sodium: 136 mmol/L (ref 135–145)
Total Bilirubin: 0.4 mg/dL (ref 0.3–1.2)
Total Protein: 8.8 g/dL — ABNORMAL HIGH (ref 6.0–8.3)

## 2015-02-28 LAB — LIPASE, BLOOD: Lipase: 31 U/L (ref 11–59)

## 2015-02-28 LAB — URINE MICROSCOPIC-ADD ON

## 2015-02-28 MED ORDER — MECLIZINE HCL 25 MG PO TABS
25.0000 mg | ORAL_TABLET | Freq: Once | ORAL | Status: AC
  Administered 2015-02-28: 25 mg via ORAL
  Filled 2015-02-28: qty 1

## 2015-02-28 MED ORDER — IOHEXOL 300 MG/ML  SOLN
100.0000 mL | Freq: Once | INTRAMUSCULAR | Status: AC | PRN
  Administered 2015-02-28: 100 mL via INTRAVENOUS

## 2015-02-28 MED ORDER — SODIUM CHLORIDE 0.9 % IV BOLUS (SEPSIS)
1000.0000 mL | Freq: Once | INTRAVENOUS | Status: AC
  Administered 2015-02-28: 1000 mL via INTRAVENOUS

## 2015-02-28 MED ORDER — METOCLOPRAMIDE HCL 5 MG/ML IJ SOLN
10.0000 mg | Freq: Once | INTRAMUSCULAR | Status: AC
  Administered 2015-02-28: 10 mg via INTRAVENOUS
  Filled 2015-02-28: qty 2

## 2015-02-28 MED ORDER — DIPHENHYDRAMINE HCL 50 MG/ML IJ SOLN
25.0000 mg | Freq: Once | INTRAMUSCULAR | Status: AC
  Administered 2015-02-28: 25 mg via INTRAVENOUS
  Filled 2015-02-28: qty 1

## 2015-02-28 MED ORDER — DEXTROSE 5 % IV SOLN
1.0000 g | Freq: Once | INTRAVENOUS | Status: AC
  Administered 2015-02-28: 1 g via INTRAVENOUS
  Filled 2015-02-28: qty 10

## 2015-02-28 MED ORDER — MECLIZINE HCL 12.5 MG PO TABS: 12.5000 mg | ORAL_TABLET | Freq: Three times a day (TID) | ORAL | Status: DC | PRN

## 2015-02-28 MED ORDER — CEPHALEXIN 500 MG PO CAPS: 500.0000 mg | ORAL_CAPSULE | Freq: Three times a day (TID) | ORAL | Status: DC

## 2015-02-28 MED ORDER — MORPHINE SULFATE 4 MG/ML IJ SOLN
4.0000 mg | Freq: Once | INTRAMUSCULAR | Status: AC
  Administered 2015-02-28: 4 mg via INTRAVENOUS
  Filled 2015-02-28: qty 1

## 2015-02-28 NOTE — Discharge Instructions (Signed)
Take keflex three times daily for a week.   Stay hydrated.   Take meclizine for dizziness.   Follow up with your doctor.   Return to ER if you have severe pain, worse dizziness, abdominal pain, fever.

## 2015-02-28 NOTE — ED Notes (Signed)
Pt c/o intermittent LUQ pain radiating to lt flank.  Denies dysuria.  Denies vomiting/diarrhea but endorses nausea and constipation.  Has daily BMs but "they are hard".

## 2015-02-28 NOTE — ED Notes (Signed)
Pt verbalized understanding of discharge instructions including follow up, reasons to return to the ED and medication to take at home. Pt denies flank pain, HA 5/10 but refuses pain medication. Pt requested ginger ale which was given to her. Pt escorted to exit via wheelchair.

## 2015-02-28 NOTE — ED Provider Notes (Signed)
CSN: [id number]     Arrival date & time 02/28/15  1329 History   First MD Initiated Contact with Patient 02/28/15 1646     Chief Complaint  Patient presents with  . Abdominal Pain  . Flank Pain     (Consider location/radiation/quality/duration/timing/severity/associated sxs/prior Treatment) The history is provided by the patient.  FREDDA CLARIDA is a 59 y.o. female hx of DM, here with LUQ pain, l flank pain, dizziness. Patient has been having intermittent left upper quadrant pain for the last 2-3 days. It was intermittent initially but became constant since yesterday. Denies any dysuria or fever or vomiting. She has been feeling nauseated and has been having hard bowel movements. She also has been lightheaded and dizzy since yesterday. She states that she felt that the room was spinning but denies any passing out. Denies any numbness or weakness.    Past Medical History  Diagnosis Date  . Diabetes mellitus without complication    Past Surgical History  Procedure Laterality Date  . Abdominal hysterectomy     No family history on file. History  Substance Use Topics  . Smoking status: Never Smoker   . Smokeless tobacco: Never Used  . Alcohol Use: No   OB History    No data available     Review of Systems  Gastrointestinal: Positive for abdominal pain.  Genitourinary: Positive for flank pain.  Neurological: Positive for dizziness.  All other systems reviewed and are negative.     Allergies  Sulfa antibiotics  Home Medications   Prior to Admission medications   Medication Sig Start Date End Date Taking? Authorizing Provider  aspirin EC 81 MG tablet Take 81 mg by mouth every morning.   Yes Historical Provider, MD  atorvastatin (LIPITOR) 20 MG tablet Take 20 mg by mouth every evening.   Yes Historical Provider, MD  Cholecalciferol (VITAMIN D) 2000 UNITS CAPS Take 1 capsule by mouth every morning.   Yes Historical Provider, MD  Cyanocobalamin (VITAMIN B 12 PO) Take 1  tablet by mouth daily.   Yes Historical Provider, MD  diazepam (VALIUM) 5 MG tablet Take 5 mg by mouth 2 (two) times daily as needed for anxiety.   Yes Historical Provider, MD  insulin detemir (LEVEMIR) 100 UNIT/ML injection Inject 40-70 Units into the skin 2 (two) times daily.   Yes Historical Provider, MD  Multiple Vitamin (MULTIVITAMIN WITH MINERALS) TABS Take 1 tablet by mouth every morning.   Yes Historical Provider, MD  Olmesartan-Amlodipine-HCTZ (TRIBENZOR) 40-5-25 MG TABS Take 1 tablet by mouth every evening.   Yes Historical Provider, MD  SitaGLIPtin-MetFORMIN HCl [phone] MG TB24 Take 1 tablet by mouth every evening.   Yes Historical Provider, MD  HYDROcodone-acetaminophen (NORCO/VICODIN) 5-325 MG per tablet Take 2 tablets by mouth every 6 (six) hours as needed for pain. Patient not taking: Reported on 02/28/2015 05/20/13   Cleatrice Burke, PA-C  nitrofurantoin, macrocrystal-monohydrate, (MACROBID) 100 MG capsule Take 1 capsule (100 mg total) by mouth 2 (two) times daily. Patient not taking: Reported on 02/28/2015 03/30/14   Bjorn Pippin, PA-C  ondansetron (ZOFRAN ODT) 8 MG disintegrating tablet 8mg  ODT q4 hours prn nausea Patient not taking: Reported on 02/28/2015 05/24/13   Malvin Johns, MD  oxyCODONE-acetaminophen (PERCOCET) 5-325 MG per tablet Take 2 tablets by mouth every 4 (four) hours as needed for pain. Patient not taking: Reported on 02/28/2015 05/24/13   Malvin Johns, MD  promethazine (PHENERGAN) 25 MG tablet Take 1 tablet (25 mg total) by mouth every 6 (  six) hours as needed for nausea. Patient not taking: Reported on 02/28/2015 05/20/13   Cleatrice Burke, PA-C  valACYclovir (VALTREX) 1000 MG tablet Take 1 tablet (1,000 mg total) by mouth 3 (three) times daily. Patient not taking: Reported on 02/28/2015 05/24/13   Malvin Johns, MD   BP 159/95 mmHg  Pulse 110  Temp(Src) 98 F (36.7 C) (Oral)  Resp 18  SpO2 100% Physical Exam  Constitutional: She is oriented to person, place, and time. She  appears well-developed and well-nourished.  HENT:  Head: Normocephalic.  Right Ear: External ear normal.  Left Ear: External ear normal.  Mouth/Throat: Oropharynx is clear and moist.  ? R horizontal nystagmus, no changing direction with gaze. No cerumen impaction.   Eyes: Conjunctivae and EOM are normal. Pupils are equal, round, and reactive to light.  Neck: Normal range of motion. Neck supple.  Cardiovascular: Normal rate, regular rhythm and normal heart sounds.   Pulmonary/Chest: Effort normal and breath sounds normal. No respiratory distress. She has no wheezes. She has no rales.  Abdominal: Soft. Bowel sounds are normal.  Mild LUQ tenderness. Mild L flank tenderness   Musculoskeletal: Normal range of motion. She exhibits no edema or tenderness.  Neurological: She is alert and oriented to person, place, and time. No cranial nerve deficit. Coordination normal.  CN 2- 12 intact. Nl finger to nose. No pronator drift. Nl gait   Skin: Skin is warm and dry.  Psychiatric: She has a normal mood and affect. Her behavior is normal. Judgment and thought content normal.  Nursing note and vitals reviewed.   ED Course  Procedures (including critical care time) Labs Review Labs Reviewed  CBC WITH DIFFERENTIAL/PLATELET - Abnormal; Notable for the following:    WBC 11.8 (*)    All other components within normal limits  COMPREHENSIVE METABOLIC PANEL - Abnormal; Notable for the following:    Glucose, Bld 283 (*)    BUN 29 (*)    Total Protein 8.8 (*)    GFR calc non Af Amer 56 (*)    GFR calc Af Amer 65 (*)    All other components within normal limits  URINALYSIS, ROUTINE W REFLEX MICROSCOPIC - Abnormal; Notable for the following:    APPearance CLOUDY (*)    Glucose, UA 100 (*)    Hgb urine dipstick TRACE (*)    Leukocytes, UA MODERATE (*)    All other components within normal limits  URINE MICROSCOPIC-ADD ON - Abnormal; Notable for the following:    Squamous Epithelial / LPF MANY (*)     Bacteria, UA FEW (*)    Casts HYALINE CASTS (*)    All other components within normal limits  LIPASE, BLOOD    Imaging Review Ct Head Wo Contrast  02/28/2015   CLINICAL DATA:  Dizziness.  EXAM: CT HEAD WITHOUT CONTRAST  TECHNIQUE: Contiguous axial images were obtained from the base of the skull through the vertex without intravenous contrast.  COMPARISON:  None.  FINDINGS: Skull and Sinuses:No fracture or destructive process. Prominent left foramen rotundum, but likely due to asymmetric imaging of the skullbase foramina. There is no abnormal density in this region.  Orbits: Symmetric appearance.  No pathologic findings.  Brain: No evidence of acute infarction, hemorrhage, hydrocephalus, or mass lesion/mass effect. Minimal low-density around the atria of the lateral ventricles could reflect early small vessel disease (patient with diabetes history) or normal dilated perivascular spaces.  IMPRESSION: No acute intracranial findings.   Electronically Signed   By: Monte Fantasia  M.D.   On: 02/28/2015 18:54   Ct Abdomen Pelvis W Contrast  02/28/2015   CLINICAL DATA:  Left upper quadrant pain radiating into the left flank  EXAM: CT ABDOMEN AND PELVIS WITH CONTRAST  TECHNIQUE: Multidetector CT imaging of the abdomen and pelvis was performed using the standard protocol following bolus administration of intravenous contrast.  CONTRAST:  122mL OMNIPAQUE IOHEXOL 300 MG/ML  SOLN  COMPARISON:  05/20/2013  FINDINGS: Lung bases are free of acute infiltrate or sizable effusion.  Diffuse fatty infiltration of the liver is seen. The gallbladder, spleen, adrenal glands and pancreas are within normal limits. The kidneys are well visualized bilaterally within normal enhancement pattern. No definitive renal calculi or obstructive changes are seen.  Scattered diverticular change is noted without evidence of diverticulitis. The appendix is within normal limits. No pelvic fluid is noted. The bladder is well distended. The uterus  has been surgically removed.  The anterior abdominal wall demonstrates some subcutaneous density which may be related to injection sites. A small umbilical fat containing hernia is seen. No incarceration is noted. The osseous structures show no acute abnormality.  IMPRESSION: Multiple chronic changes as described above. These are stable in appearance from the prior exam.  No findings to correspond with the patient's given current clinical history.   Electronically Signed   By: Inez Catalina M.D.   On: 02/28/2015 18:56     EKG Interpretation None      MDM   Final diagnoses:  Left flank pain    KELLYJO EDGREN is a 59 y.o. female here with ab pain, vertigo. Consider pyelo vs colitis vs constipation. Vertigo likely BPPV.   7:36 PM WBC 11. UA + UTI. CT showed no SBO or renal abscess. CT head unremarkable. Given rocephin. Tachycardic initially but HR 90s on discharge. Will dc home with keflex.   Wandra Arthurs, MD 02/28/15 [phone]

## 2015-03-03 LAB — URINE CULTURE: Colony Count: 75000

## 2015-03-04 ENCOUNTER — Telehealth (HOSPITAL_COMMUNITY): Payer: Self-pay | Admitting: *Deleted

## 2015-08-07 ENCOUNTER — Other Ambulatory Visit: Payer: Self-pay | Admitting: Gastroenterology

## 2015-08-07 DIAGNOSIS — R1011 Right upper quadrant pain: Secondary | ICD-10-CM

## 2015-08-09 ENCOUNTER — Other Ambulatory Visit: Payer: Managed Care, Other (non HMO)

## 2015-08-13 ENCOUNTER — Ambulatory Visit
Admission: RE | Admit: 2015-08-13 | Discharge: 2015-08-13 | Disposition: A | Discharge status: Home / Self Care | Payer: BLUE CROSS/BLUE SHIELD | Source: Ambulatory Visit | Attending: Gastroenterology | Admitting: Gastroenterology

## 2015-08-13 DIAGNOSIS — R1011 Right upper quadrant pain: Secondary | ICD-10-CM

## 2016-03-25 ENCOUNTER — Other Ambulatory Visit: Payer: Self-pay | Admitting: Gastroenterology

## 2016-04-23 ENCOUNTER — Encounter (HOSPITAL_COMMUNITY): Payer: Self-pay | Admitting: *Deleted

## 2016-05-07 NOTE — H&P (Signed)
  Rhonda Roberts HPI: The patient reports a worsening of her LUQ pain and now she has a RUQ pain. It is bilateral and it can hurt with movement or without movement. In the past she was treated with a Medrol Dose Pack in May last year with improvement, however, she cannot recall the improvement of her pain with the last dosing in September. Her recollection is that it was not successful. Dr. Lynder Parents treated with another anti-inflammatory, but it was not successful.  Past Medical History  Diagnosis Date  . Diabetes mellitus without complication ([place])   . Hypertension   . GERD (gastroesophageal reflux disease)   . Headache     Past Surgical History  Procedure Laterality Date  . Abdominal hysterectomy    . Cesarean section  1987    History reviewed. No pertinent family history.  Social History:  reports that she has never smoked. She has never used smokeless tobacco. She reports that she does not drink alcohol or use illicit drugs.  Allergies:  Allergies  Allergen Reactions  . Sulfa Antibiotics Itching, Swelling and Rash    Medications: Scheduled: Continuous:  No results found for this or any previous visit (from the past 24 hour(s)).   No results found.  ROS:  As stated above in the HPI otherwise negative.  There were no vitals taken for this visit.    PE: Gen: NAD, Alert and Oriented HEENT:  /AT, EOMI Neck: Supple, no LAD Lungs: CTA Bilaterally CV: RRR without M/G/R ABM: Soft, NTND, +BS Ext: No C/C/E  Assessment/Plan: 1) Screening colonoscopy. 2) Costochondritis. 3) ABM pain - No pain at this time.  Rhonda Roberts D 05/07/2016, 11:11 AM

## 2016-05-08 ENCOUNTER — Ambulatory Visit (HOSPITAL_COMMUNITY): Payer: BLUE CROSS/BLUE SHIELD | Admitting: Anesthesiology

## 2016-05-08 ENCOUNTER — Ambulatory Visit (HOSPITAL_COMMUNITY)
Admission: RE | Admit: 2016-05-08 | Discharge: 2016-05-08 | Disposition: A | Discharge status: Home / Self Care | Payer: BLUE CROSS/BLUE SHIELD | Source: Ambulatory Visit | Attending: Gastroenterology | Admitting: Gastroenterology

## 2016-05-08 ENCOUNTER — Encounter (HOSPITAL_COMMUNITY): Payer: Self-pay | Admitting: Certified Registered Nurse Anesthetist

## 2016-05-08 ENCOUNTER — Encounter (HOSPITAL_COMMUNITY)
Admission: RE | Disposition: A | Discharge status: Home / Self Care | Payer: Self-pay | Source: Ambulatory Visit | Attending: Gastroenterology

## 2016-05-08 DIAGNOSIS — K5730 Diverticulosis of large intestine without perforation or abscess without bleeding: Secondary | ICD-10-CM | POA: Diagnosis not present

## 2016-05-08 DIAGNOSIS — K219 Gastro-esophageal reflux disease without esophagitis: Secondary | ICD-10-CM | POA: Insufficient documentation

## 2016-05-08 DIAGNOSIS — Z6841 Body Mass Index (BMI) 40.0 and over, adult: Secondary | ICD-10-CM | POA: Insufficient documentation

## 2016-05-08 DIAGNOSIS — Z7952 Long term (current) use of systemic steroids: Secondary | ICD-10-CM | POA: Diagnosis not present

## 2016-05-08 DIAGNOSIS — Z1211 Encounter for screening for malignant neoplasm of colon: Secondary | ICD-10-CM | POA: Insufficient documentation

## 2016-05-08 DIAGNOSIS — E119 Type 2 diabetes mellitus without complications: Secondary | ICD-10-CM | POA: Diagnosis not present

## 2016-05-08 DIAGNOSIS — M940 Chondrocostal junction syndrome [Tietze]: Secondary | ICD-10-CM | POA: Diagnosis not present

## 2016-05-08 DIAGNOSIS — D122 Benign neoplasm of ascending colon: Secondary | ICD-10-CM | POA: Diagnosis not present

## 2016-05-08 DIAGNOSIS — Z7982 Long term (current) use of aspirin: Secondary | ICD-10-CM | POA: Diagnosis not present

## 2016-05-08 DIAGNOSIS — Z79899 Other long term (current) drug therapy: Secondary | ICD-10-CM | POA: Insufficient documentation

## 2016-05-08 DIAGNOSIS — I10 Essential (primary) hypertension: Secondary | ICD-10-CM | POA: Diagnosis not present

## 2016-05-08 DIAGNOSIS — Z7984 Long term (current) use of oral hypoglycemic drugs: Secondary | ICD-10-CM | POA: Diagnosis not present

## 2016-05-08 DIAGNOSIS — D123 Benign neoplasm of transverse colon: Secondary | ICD-10-CM | POA: Insufficient documentation

## 2016-05-08 HISTORY — DX: Gastro-esophageal reflux disease without esophagitis: K21.9

## 2016-05-08 HISTORY — DX: Headache: R51

## 2016-05-08 HISTORY — DX: Essential (primary) hypertension: I10

## 2016-05-08 HISTORY — DX: Headache, unspecified: R51.9

## 2016-05-08 HISTORY — PX: COLONOSCOPY WITH PROPOFOL: SHX5780

## 2016-05-08 LAB — GLUCOSE, CAPILLARY: Glucose-Capillary: 159 mg/dL — ABNORMAL HIGH (ref 65–99)

## 2016-05-08 SURGERY — COLONOSCOPY WITH PROPOFOL
Anesthesia: Monitor Anesthesia Care

## 2016-05-08 MED ORDER — SODIUM CHLORIDE 0.9 % IV SOLN: INTRAVENOUS | Status: DC

## 2016-05-08 MED ORDER — EPHEDRINE SULFATE 50 MG/ML IJ SOLN
INTRAMUSCULAR | Status: AC
  Filled 2016-05-08: qty 1

## 2016-05-08 MED ORDER — PROPOFOL 500 MG/50ML IV EMUL
INTRAVENOUS | Status: DC | PRN
  Administered 2016-05-08: 30 mg via INTRAVENOUS
  Administered 2016-05-08: 50 mg via INTRAVENOUS

## 2016-05-08 MED ORDER — LACTATED RINGERS IV SOLN
INTRAVENOUS | Status: DC
  Administered 2016-05-08: 09:00:00 via INTRAVENOUS

## 2016-05-08 MED ORDER — PROPOFOL 500 MG/50ML IV EMUL
INTRAVENOUS | Status: DC | PRN
  Administered 2016-05-08: 100 ug/kg/min via INTRAVENOUS

## 2016-05-08 MED ORDER — PHENYLEPHRINE HCL 10 MG/ML IJ SOLN
INTRAMUSCULAR | Status: DC | PRN
  Administered 2016-05-08: 80 ug via INTRAVENOUS

## 2016-05-08 MED ORDER — SODIUM CHLORIDE 0.9 % IJ SOLN
INTRAMUSCULAR | Status: AC
  Filled 2016-05-08: qty 10

## 2016-05-08 MED ORDER — PHENYLEPHRINE 40 MCG/ML (10ML) SYRINGE FOR IV PUSH (FOR BLOOD PRESSURE SUPPORT)
PREFILLED_SYRINGE | INTRAVENOUS | Status: AC
  Filled 2016-05-08: qty 20

## 2016-05-08 MED ORDER — PROPOFOL 10 MG/ML IV BOLUS
INTRAVENOUS | Status: AC
  Filled 2016-05-08: qty 40

## 2016-05-08 SURGICAL SUPPLY — 21 items

## 2016-05-08 NOTE — Anesthesia Postprocedure Evaluation (Signed)
Anesthesia Post Note  Patient: Rhonda Roberts  Procedure(s) Performed: Procedure(s) (LRB): COLONOSCOPY WITH PROPOFOL (N/A)  Patient location during evaluation: PACU Anesthesia Type: MAC Level of consciousness: awake and alert Pain management: pain level controlled Vital Signs Assessment: post-procedure vital signs reviewed and stable Respiratory status: spontaneous breathing, nonlabored ventilation and respiratory function stable Cardiovascular status: stable and blood pressure returned to baseline Anesthetic complications: no    Last Vitals:  Filed Vitals:   05/08/16 0915 05/08/16 0920  BP: 97/54 110/76  Pulse: 90 86  Temp:    Resp: 19 17    Last Pain: There were no vitals filed for this visit.               Benjamine Strout,W. EDMOND

## 2016-05-08 NOTE — Discharge Instructions (Signed)

## 2016-05-08 NOTE — Transfer of Care (Signed)
Immediate Anesthesia Transfer of Care Note  Patient: Rhonda Roberts  Procedure(s) Performed: Procedure(s): COLONOSCOPY WITH PROPOFOL (N/A)  Patient Location: PACU  Anesthesia Type:MAC  Level of Consciousness: awake, alert  and oriented  Airway & Oxygen Therapy: Patient Spontanous Breathing and Patient connected to face mask oxygen  Post-op Assessment: Report given to RN and Post -op Vital signs reviewed and stable  Post vital signs: Reviewed and stable  Last Vitals:  Filed Vitals:   05/08/16 0817  BP: 144/85  Pulse: 96  Temp: 36.6 C  Resp: 16    Last Pain: There were no vitals filed for this visit.       Complications: No apparent anesthesia complications

## 2016-05-08 NOTE — Anesthesia Preprocedure Evaluation (Addendum)
Anesthesia Evaluation  Patient identified by MRN, date of birth, ID band Patient awake    Reviewed: Allergy & Precautions, H&P , NPO status , Patient's Chart, lab work & pertinent test results  Airway Mallampati: II  TM Distance: >3 FB Neck ROM: Full    Dental no notable dental hx. (+) Teeth Intact, Dental Advisory Given   Pulmonary neg pulmonary ROS,    Pulmonary exam normal breath sounds clear to auscultation       Cardiovascular hypertension, Pt. on medications  Rhythm:Regular Rate:Normal     Neuro/Psych  Headaches, negative psych ROS   GI/Hepatic Neg liver ROS, GERD  Medicated and Controlled,  Endo/Other  diabetes, Type 2, Oral Hypoglycemic AgentsMorbid obesity  Renal/GU negative Renal ROS  negative genitourinary   Musculoskeletal   Abdominal   Peds  Hematology negative hematology ROS (+)   Anesthesia Other Findings   Reproductive/Obstetrics negative OB ROS                            Anesthesia Physical Anesthesia Plan  ASA: III  Anesthesia Plan: MAC   Post-op Pain Management:    Induction: Intravenous  Airway Management Planned: Simple Face Mask  Additional Equipment:   Intra-op Plan:   Post-operative Plan:   Informed Consent: I have reviewed the patients History and Physical, chart, labs and discussed the procedure including the risks, benefits and alternatives for the proposed anesthesia with the patient or authorized representative who has indicated his/her understanding and acceptance.   Dental advisory given  Plan Discussed with: CRNA  Anesthesia Plan Comments:         Anesthesia Quick Evaluation

## 2016-05-08 NOTE — Op Note (Signed)
[facility] Patient Name: Rhonda Roberts Procedure Date: 05/08/2016 MRN: [id number] Attending MD: Carol Ada , MD Date of Birth: 1956-06-29 CSN: [id number] Age: 60 Admit Type: Outpatient Procedure:                Colonoscopy Indications:              Screening for colorectal malignant neoplasm Providers:                Carol Ada, MD, Cleda Daub, RN, Corliss Parish, Technician Referring MD:              Medicines:                Propofol per Anesthesia Complications:            No immediate complications. Estimated Blood Loss:     Estimated blood loss was minimal. Procedure:                Pre-Anesthesia Assessment:                           - Prior to the procedure, a History and Physical                            was performed, and patient medications and                            allergies were reviewed. The patient's tolerance of                            previous anesthesia was also reviewed. The risks                            and benefits of the procedure and the sedation                            options and risks were discussed with the patient.                            All questions were answered, and informed consent                            was obtained. Prior Anticoagulants: The patient has                            taken no previous anticoagulant or antiplatelet                            agents. ASA Grade Assessment: III - A patient with                            severe systemic disease. After reviewing the risks  and benefits, the patient was deemed in                            satisfactory condition to undergo the procedure.                           - Sedation was administered by an anesthesia                            professional. Deep sedation was attained.                           After obtaining informed consent, the colonoscope                            was passed  under direct vision. Throughout the                            procedure, the patient's blood pressure, pulse, and                            oxygen saturations were monitored continuously. The                            EC-[id number] scope was introduced through                            the anus and advanced to the the cecum, identified                            by appendiceal orifice and ileocecal valve. The                            colonoscopy was performed without difficulty. The                            patient tolerated the procedure well. The quality                            of the bowel preparation was excellent. The                            ileocecal valve, appendiceal orifice, and rectum                            were photographed. Scope In: 8:46:32 AM Scope Out: 9:00:59 AM Scope Withdrawal Time: 0 hours 10 minutes 9 seconds  Total Procedure Duration: 0 hours 14 minutes 27 seconds  Findings:      Two sessile polyps were found in the transverse colon and ascending       colon. The polyps were 2 to 3 mm in size. These polyps were removed with       a cold snare. Resection and retrieval were complete.      Scattered small and large-mouthed diverticula were found in the sigmoid  colon and descending colon. Impression:               - Two 2 to 3 mm polyps in the transverse colon and                            in the ascending colon, removed with a cold snare.                            Resected and retrieved.                           - Diverticulosis in the sigmoid colon and in the                            descending colon. Moderate Sedation:      N/A- Per Anesthesia Care Recommendation:           - Patient has a contact number available for                            emergencies. The signs and symptoms of potential                            delayed complications were discussed with the                            patient. Return to normal activities  tomorrow.                            Written discharge instructions were provided to the                            patient.                           - Resume previous diet.                           - Continue present medications.                           - Await pathology results.                           - Repeat colonoscopy in 5-10 years for surveillance. Procedure Code(s):        --- Professional ---                           [phone], Colonoscopy, flexible; with removal of                            tumor(s), polyp(s), or other lesion(s) by snare                            technique Diagnosis Code(s):        --- Professional ---  Z12.11, Encounter for screening for malignant                            neoplasm of colon                           D12.3, Benign neoplasm of transverse colon (hepatic                            flexure or splenic flexure)                           D12.2, Benign neoplasm of ascending colon                           K57.30, Diverticulosis of large intestine without                            perforation or abscess without bleeding CPT copyright 2016 American [employer]. All rights reserved. The codes documented in this report are preliminary and upon coder review may  be revised to meet current compliance requirements. Carol Ada, MD Carol Ada, MD 05/08/2016 9:05:20 AM This report has been signed electronically. Number of Addenda: 0

## 2016-05-11 ENCOUNTER — Encounter (HOSPITAL_COMMUNITY): Payer: Self-pay | Admitting: Gastroenterology

## 2016-10-19 ENCOUNTER — Encounter (HOSPITAL_COMMUNITY): Payer: Self-pay | Admitting: Emergency Medicine

## 2016-10-19 ENCOUNTER — Emergency Department (HOSPITAL_BASED_OUTPATIENT_CLINIC_OR_DEPARTMENT_OTHER)
Admit: 2016-10-19 | Discharge: 2016-10-19 | Disposition: A | Discharge status: Home / Self Care | Payer: BLUE CROSS/BLUE SHIELD

## 2016-10-19 ENCOUNTER — Emergency Department (HOSPITAL_COMMUNITY)
Admission: EM | Admit: 2016-10-19 | Discharge: 2016-10-19 | Disposition: A | Discharge status: Home / Self Care | Payer: BLUE CROSS/BLUE SHIELD | Attending: Emergency Medicine | Admitting: Emergency Medicine

## 2016-10-19 DIAGNOSIS — M7989 Other specified soft tissue disorders: Secondary | ICD-10-CM

## 2016-10-19 DIAGNOSIS — M79609 Pain in unspecified limb: Secondary | ICD-10-CM | POA: Diagnosis not present

## 2016-10-19 DIAGNOSIS — R609 Edema, unspecified: Secondary | ICD-10-CM

## 2016-10-19 DIAGNOSIS — M79605 Pain in left leg: Secondary | ICD-10-CM | POA: Diagnosis present

## 2016-10-19 DIAGNOSIS — I10 Essential (primary) hypertension: Secondary | ICD-10-CM | POA: Insufficient documentation

## 2016-10-19 DIAGNOSIS — Z7984 Long term (current) use of oral hypoglycemic drugs: Secondary | ICD-10-CM | POA: Diagnosis not present

## 2016-10-19 DIAGNOSIS — R600 Localized edema: Secondary | ICD-10-CM

## 2016-10-19 DIAGNOSIS — E119 Type 2 diabetes mellitus without complications: Secondary | ICD-10-CM | POA: Diagnosis not present

## 2016-10-19 DIAGNOSIS — Z7982 Long term (current) use of aspirin: Secondary | ICD-10-CM | POA: Diagnosis not present

## 2016-10-19 DIAGNOSIS — I824Z2 Acute embolism and thrombosis of unspecified deep veins of left distal lower extremity: Secondary | ICD-10-CM

## 2016-10-19 LAB — BASIC METABOLIC PANEL
Anion gap: 8 (ref 5–15)
BUN: 28 mg/dL — ABNORMAL HIGH (ref 6–20)
CO2: 24 mmol/L (ref 22–32)
Calcium: 9.5 mg/dL (ref 8.9–10.3)
Chloride: 105 mmol/L (ref 101–111)
Creatinine, Ser: 1.07 mg/dL — ABNORMAL HIGH (ref 0.44–1.00)
GFR calc Af Amer: >60 mL/min (ref >60)
GFR calc non Af Amer: 55 mL/min — ABNORMAL LOW (ref >60)
Glucose, Bld: 130 mg/dL — ABNORMAL HIGH (ref 65–99)
Potassium: 3.6 mmol/L (ref 3.5–5.1)
Sodium: 137 mmol/L (ref 135–145)

## 2016-10-19 LAB — CBC WITH DIFFERENTIAL/PLATELET
Basophils Absolute: 0 10*3/uL (ref 0.0–0.1)
Basophils Relative: 0 %
Eosinophils Absolute: 0.7 10*3/uL (ref 0.0–0.7)
Eosinophils Relative: 5 %
HCT: 31.5 % — ABNORMAL LOW (ref 36.0–46.0)
Hemoglobin: 10.9 g/dL — ABNORMAL LOW (ref 12.0–15.0)
Lymphocytes Relative: 11 %
Lymphs Abs: 1.6 10*3/uL (ref 0.7–4.0)
MCH: 28.5 pg (ref 26.0–34.0)
MCHC: 34.6 g/dL (ref 30.0–36.0)
MCV: 82.2 fL (ref 78.0–100.0)
Monocytes Absolute: 0.5 10*3/uL (ref 0.1–1.0)
Monocytes Relative: 3 %
Neutro Abs: 12.3 10*3/uL — ABNORMAL HIGH (ref 1.7–7.7)
Neutrophils Relative %: 81 %
Platelets: 264 10*3/uL (ref 150–400)
RBC: 3.83 MIL/uL — ABNORMAL LOW (ref 3.87–5.11)
RDW: 15.4 % (ref 11.5–15.5)
WBC: 15.2 10*3/uL — ABNORMAL HIGH (ref 4.0–10.5)

## 2016-10-19 LAB — HEPATIC FUNCTION PANEL
ALT: 28 U/L (ref 14–54)
AST: 40 U/L (ref 15–41)
Albumin: 3.5 g/dL (ref 3.5–5.0)
Alkaline Phosphatase: 77 U/L (ref 38–126)
Bilirubin, Direct: 0.5 mg/dL (ref 0.1–0.5)
Indirect Bilirubin: 0.8 mg/dL (ref 0.3–0.9)
Total Bilirubin: 1.3 mg/dL — ABNORMAL HIGH (ref 0.3–1.2)
Total Protein: 8.3 g/dL — ABNORMAL HIGH (ref 6.5–8.1)

## 2016-10-19 LAB — PROTIME-INR
INR: 0.97
Prothrombin Time: 12.9 seconds (ref 11.4–15.2)

## 2016-10-19 MED ORDER — RIVAROXABAN 15 MG PO TABS
15.0000 mg | ORAL_TABLET | Freq: Once | ORAL | Status: AC
  Administered 2016-10-19: 15 mg via ORAL
  Filled 2016-10-19 (×2): qty 1

## 2016-10-19 MED ORDER — DOXYCYCLINE HYCLATE 100 MG PO TABS
100.0000 mg | ORAL_TABLET | Freq: Once | ORAL | Status: AC
  Administered 2016-10-19: 100 mg via ORAL
  Filled 2016-10-19: qty 1

## 2016-10-19 MED ORDER — MORPHINE SULFATE (PF) 4 MG/ML IV SOLN
4.0000 mg | Freq: Once | INTRAVENOUS | Status: AC
  Administered 2016-10-19: 4 mg via INTRAVENOUS
  Filled 2016-10-19: qty 1

## 2016-10-19 MED ORDER — RIVAROXABAN (XARELTO) VTE STARTER PACK (15 & 20 MG): ORAL_TABLET | ORAL | 0 refills | Status: DC

## 2016-10-19 MED ORDER — DOXYCYCLINE HYCLATE 100 MG PO CAPS: 100.0000 mg | ORAL_CAPSULE | Freq: Two times a day (BID) | ORAL | 0 refills | Status: DC

## 2016-10-19 MED ORDER — TRAMADOL HCL 50 MG PO TABS: 50.0000 mg | ORAL_TABLET | Freq: Four times a day (QID) | ORAL | 0 refills | Status: DC | PRN

## 2016-10-19 NOTE — Discharge Instructions (Signed)
Information on my medicine - XARELTO (rivaroxaban)  This medication education was reviewed with me or my healthcare representative as part of my discharge preparation.  The pharmacist that spoke with me during my hospital stay was:  Minda Ditto, [place]? Xarelto was prescribed to treat blood clots that may have been found in the veins of your legs (deep vein thrombosis) or in your lungs (pulmonary embolism) and to reduce the risk of them occurring again.  WHAT DO YOU NEED TO KNOW ABOUT XARELTO? The starting dose is one 15 mg tablet taken TWICE daily with food for the FIRST 21 DAYS then on (enter date)  11/09/2016  the dose is changed to one 20 mg tablet taken ONCE A DAY with your evening meal.  DO NOT stop taking Xarelto without talking to the health care provider who prescribed the medication.  Refill your prescription for 20 mg tablets before you run out.  After discharge, you should have regular check-up appointments with your healthcare provider that is prescribing your Xarelto.  In the future your dose may need to be changed if your kidney function changes by a significant amount.  WHAT DO YOU DO IF YOU MISS A DOSE? If you are taking Xarelto TWICE DAILY and you miss a dose, take it as soon as you remember. You may take two 15 mg tablets (total 30 mg) at the same time then resume your regularly scheduled 15 mg twice daily the next day.  If you are taking Xarelto ONCE DAILY and you miss a dose, take it as soon as you remember on the same day then continue your regularly scheduled once daily regimen the next day. Do not take two doses of Xarelto at the same time.   IMPORTANT SAFETY INFORMATION Xarelto is a blood thinner medicine that can cause bleeding. You should call your healthcare provider right away if you experience any of the following: Bleeding from an injury or your nose that does not stop. Unusual colored urine (red or dark brown) or  unusual colored stools (red or black). Unusual bruising for unknown reasons. A serious fall or if you hit your head (even if there is no bleeding).  Some medicines may interact with Xarelto and might increase your risk of bleeding while on Xarelto. To help avoid this, consult your healthcare provider or pharmacist prior to using any new prescription or non-prescription medications, including herbals, vitamins, non-steroidal anti-inflammatory drugs (NSAIDs) and supplements.  This website has more information on Xarelto: [email or web address]

## 2016-10-19 NOTE — Progress Notes (Signed)
**  Preliminary report by tech**  Left lower extremity venous duplex complete. There is evidence of acute deep vein thrombosis involving the posterior tibial vein of the left lower extremity.  There is evidence of acute superficial vein thrombosis involving the great saphenous vein in the mid calf of the left lower extremity. There is no evidence of a Baker's cyst on the left. Results were given to [employer].  10/19/16 11:15 AM Rhonda Roberts RVT

## 2016-10-19 NOTE — ED Notes (Signed)
Vascular tech in the room with the patient.

## 2016-10-19 NOTE — ED Triage Notes (Signed)
Pt c/o L lower leg redness and swelling x 4-5 days, progressively worsening. Pt's calf is red and swollen and warm to touch. Pt also c/o mild swelling in R leg. Denies SOB, Dizziness, Chest pain. Pain localized to L lower leg. Possible cellulitis. A&Ox4 and ambulatory.

## 2016-10-19 NOTE — Progress Notes (Signed)
Brief Pharmacy Note: Xarelto for DVT to begin in ED  Counseled patient on Xarelto doses, PTA medications, acceptable OTC meds, risk of bleed  Xarelto coupon card given  One dose Xarelto 15mg  to be given in ED prior to discharge.  Thank you for the consult  Minda Ditto PharmD Pager [phone] 10/19/2016, 11:59 AM

## 2016-10-19 NOTE — ED Provider Notes (Signed)
[facility] DEPT Provider Note   CSN: [id number] Arrival date & time: 10/19/16  0844     History   Chief Complaint Chief Complaint  Patient presents with  . Leg Pain  . Leg Swelling    HPI Rhonda Roberts is a 60 y.o. female.  Patient is 60 yo F with PMH of diabetes, GERD, and HTN, presenting with chief complaint of pain, swelling, and redness in her left lower extremity that has been worsening over the past 4-5 days. Patient states she was stung by a bee in October at her left calf, and has had pain since then, but it has been worsening over the past few days. She has no known allergy to bee venom, and denies any fevers, chills, rash, or other skin changes. Pain is aggravated by standing or initiating walking, but she has been ambulating without difficulty. She has tried ibuprofen for relief, but it was minimally effective. 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 She also denies any chest pain or shortness of breath.       Past Medical History:  Diagnosis Date  . Diabetes mellitus without complication ([place])   . GERD (gastroesophageal reflux disease)   . Headache   . Hypertension     There are no active problems to display for this patient.   Past Surgical History:  Procedure Laterality Date  . ABDOMINAL HYSTERECTOMY    . CESAREAN SECTION  1987  . COLONOSCOPY WITH PROPOFOL N/A 05/08/2016   Procedure: COLONOSCOPY WITH PROPOFOL;  Surgeon: Carol Ada, MD;  Location: [facility];  Service: Endoscopy;  Laterality: N/A;    OB History    No data available       Home Medications    Prior to Admission medications   Medication Sig Start Date End Date Taking? Authorizing Provider  aspirin EC 81 MG tablet Take 81 mg by mouth every morning.    Historical Provider, MD  atorvastatin (LIPITOR) 20 MG  tablet Take 20 mg by mouth every evening.    Historical Provider, MD  Cholecalciferol (VITAMIN D) 2000 UNITS CAPS Take 2,000 Units by mouth every morning.     Historical Provider, MD  Cyanocobalamin (VITAMIN B 12 PO) Take 1 tablet by mouth daily.    Historical Provider, MD  diazepam (VALIUM) 5 MG tablet Take 5 mg by mouth 2 (two) times daily as needed for anxiety.    Historical Provider, MD  ibuprofen (ADVIL,MOTRIN) 200 MG tablet Take 200 mg by mouth every 6 (six) hours as needed (For pain.).    Historical Provider, MD  levocetirizine (XYZAL) 5 MG tablet Take 5 mg by mouth at bedtime. 02/27/16   Historical Provider, MD  methocarbamol (ROBAXIN) 750 MG tablet Take 750 mg by mouth 2 (two) times daily as needed for muscle spasms.  02/28/16   Historical Provider, MD  Multiple Vitamin (MULTIVITAMIN WITH MINERALS) TABS Take 1 tablet by mouth every morning.    Historical Provider, MD  Olmesartan-Amlodipine-HCTZ (TRIBENZOR) 40-5-25 MG TABS Take 1 tablet by mouth every evening.    Historical Provider, MD  predniSONE (DELTASONE) 10 MG tablet Take 10 mg by mouth daily. 04/10/16   Historical Provider, MD  promethazine (PHENERGAN) 25 MG tablet Take 1 tablet (25 mg total) by mouth every 6 (six) hours as needed for nausea. 05/20/13   Cleatrice Burke, PA-C  SitaGLIPtin-MetFORMIN HCl [phone] MG TB24 Take 1 tablet by mouth every evening.    Historical Provider, MD  TRESIBA FLEXTOUCH 200 UNIT/ML SOPN Inject 65 Units into the skin 2 (two) times daily as needed (Take based on daily blood sugar levels.).  03/17/16   Historical Provider, MD    Family History No family history on file.  Social History Social History  Substance Use Topics  . Smoking status: Never Smoker  . Smokeless tobacco: Never Used  . Alcohol use No     Allergies   Sulfa antibiotics   Review of Systems Review of Systems  All other systems reviewed and are negative.    Physical Exam Updated Vital Signs BP 130/77 (BP Location: Left Arm)    Pulse 97   Temp 97.6 F (36.4 C) (Oral)   Resp 18   SpO2 95%   Physical Exam  Constitutional:  Pleasant, obese female, in no acute distress.  HENT:  Head: Normocephalic and atraumatic.  Mouth/Throat: Oropharynx is clear and moist.  Eyes: Conjunctivae are normal.  Neck: Normal range of motion.  Cardiovascular: Normal rate, regular rhythm, normal heart sounds and intact distal pulses.   Pedal and PT pulses 1+ bilaterally.  Pulmonary/Chest: Effort normal and breath sounds normal. No respiratory distress.  Abdominal: Soft. There is no tenderness.  Musculoskeletal: Normal range of motion. She exhibits edema and tenderness.  Left calf is erythematous, with 1+ pitting edema, and mild TTP. No obvious break in skin, drainage, or warmth noted.  Neurological: She is alert. She displays normal reflexes. No sensory deficit. She exhibits normal muscle tone.  Skin: Skin is warm and dry.  Psychiatric: She has a normal mood and affect.  Nursing note and vitals reviewed.    ED Treatments / Results  Labs (all labs ordered are listed, but only abnormal results are displayed) Labs Reviewed  BASIC METABOLIC PANEL  CBC WITH DIFFERENTIAL/PLATELET    EKG  EKG Interpretation None       Radiology No results found.  Procedures Procedures (including critical care time)  Medications Ordered in ED Medications  morphine 4 MG/ML injection 4 mg (4 mg Intravenous Given 10/19/16 1029)     Initial Impression / Assessment and Plan / ED Course  I have reviewed the triage vital signs and the nursing notes.  Pertinent labs & imaging results that were available during my care of the patient were reviewed by me and considered in my medical decision making (see chart for details).  Clinical Course    Patient is 60 yo F presenting with pain, swelling, and redness in her left lower extremity that has been worsening over the past 4-5 days. On exam, she had 1+ pitting edema in her left calf with mild  TTP, but she is neurovascularly intact. She is afebrile and has no obvious break in skin, drainage, or warmth concerning for cellulitis, but given leukocytosis of 15.2, doxycyline ordered for antibiotic prophylaxis. LLE venous duplex showed evidence of DVT involving the posterior tibial vein, as well as SVT involving the great saphenous vein in the mid calf. She denies any chest pain, cough, or shortness of breath concerning for PE, and CT angio chest deferred. Consult placed to Pharmacy for dosing and education on Xarelto, and patient acknowledged understanding of outpatient treatment and medication instructions. Prescription doxycycline also provided, and advised patient to complete full course antibiotics as well as daily Xarelto, and to f/u with PCP in 2 days for reevaluation. Return precautions discussed for new or worsening symptoms as  outlined in d/c summary.  Final Clinical Impressions(s) / ED Diagnoses   Final diagnoses:  Acute deep vein thrombosis (DVT) of distal end of left lower extremity (HCC)  Leg swelling  Leg edema    New Prescriptions New Prescriptions   DOXYCYCLINE (VIBRAMYCIN) 100 MG CAPSULE    Take 1 capsule (100 mg total) by mouth 2 (two) times daily.   RIVAROXABAN 15 & 20 MG TBPK    Take as directed on package: Start with one 15mg  tablet by mouth twice a day with food. On Day 22, switch to one 20mg  tablet once a day with food.   TRAMADOL (ULTRAM) 50 MG TABLET    Take 1 tablet (50 mg total) by mouth every 6 (six) hours as needed.     Stacie Glaze [place], [place] 10/19/16 [place], MD 10/19/16 [phone]

## 2016-12-18 ENCOUNTER — Ambulatory Visit
Admission: RE | Admit: 2016-12-18 | Discharge: 2016-12-18 | Disposition: A | Discharge status: Home / Self Care | Payer: BLUE CROSS/BLUE SHIELD | Source: Ambulatory Visit | Attending: Family | Admitting: Family

## 2016-12-18 ENCOUNTER — Other Ambulatory Visit: Payer: Self-pay | Admitting: Family

## 2016-12-18 DIAGNOSIS — R05 Cough: Secondary | ICD-10-CM

## 2016-12-18 DIAGNOSIS — R059 Cough, unspecified: Secondary | ICD-10-CM

## 2016-12-18 DIAGNOSIS — R0989 Other specified symptoms and signs involving the circulatory and respiratory systems: Secondary | ICD-10-CM

## 2017-01-29 ENCOUNTER — Ambulatory Visit (INDEPENDENT_AMBULATORY_CARE_PROVIDER_SITE_OTHER): Payer: Self-pay | Admitting: Orthopedic Surgery

## 2017-02-11 ENCOUNTER — Ambulatory Visit (INDEPENDENT_AMBULATORY_CARE_PROVIDER_SITE_OTHER): Payer: BLUE CROSS/BLUE SHIELD

## 2017-02-11 ENCOUNTER — Ambulatory Visit (INDEPENDENT_AMBULATORY_CARE_PROVIDER_SITE_OTHER): Payer: BLUE CROSS/BLUE SHIELD | Admitting: Orthopedic Surgery

## 2017-02-11 ENCOUNTER — Encounter (INDEPENDENT_AMBULATORY_CARE_PROVIDER_SITE_OTHER): Payer: Self-pay | Admitting: Orthopedic Surgery

## 2017-02-11 DIAGNOSIS — M25511 Pain in right shoulder: Secondary | ICD-10-CM

## 2017-02-11 DIAGNOSIS — M5442 Lumbago with sciatica, left side: Secondary | ICD-10-CM | POA: Diagnosis not present

## 2017-02-11 DIAGNOSIS — G8929 Other chronic pain: Secondary | ICD-10-CM

## 2017-02-11 DIAGNOSIS — M542 Cervicalgia: Secondary | ICD-10-CM

## 2017-02-11 DIAGNOSIS — M25512 Pain in left shoulder: Secondary | ICD-10-CM | POA: Diagnosis not present

## 2017-02-11 NOTE — Progress Notes (Signed)
Office Visit Note   Patient: Rhonda Roberts           Date of Birth: 24-Feb-1956           MRN: [id number] Visit Date: 02/11/2017 Requested by: Nanci Pina, [place], [place] [address] PCP: Nanci Pina, FNP  Subjective: Chief Complaint  Patient presents with  . Left Shoulder - Pain  . Right Shoulder - Pain  . Neck - Pain  . Lower Back - Pain    HPI: Rhonda Roberts is a 61 year old patient with bilateral arm pain left worse than right as well as low back pain.  Patient states that she has radicular pain which radiates down into both hands with numbness and tingling on the palm side.  She is right-hand dominant.  She reports occasional neck pain with some decreased range of motion in the left arm.  Denies any history of injury.  Patient also reports middle low back pain which comes and goes.  She has some leg pain which radiates into the left leg but she is also recovering from a deep vein thrombosis in that leg.  Numbness and tingling occasionally occurs in the left foot and this is worse with prolonged standing.  Mobley test has not been helpful.  She does have a history of diabetic neuropathy.  She works in a call center doing collections and typing.  Her shoulder pain has been present for 5 months.  Localizes pain in the deltoid region at night.  She does have history of carpal tunnel release on that left hand.              ROS: All systems reviewed are negative as they relate to the chief complaint within the history of present illness.  Patient denies  fevers or chills.   Assessment & Plan: Visit Diagnoses:  1. Cervicalgia   2. Chronic midline low back pain with left-sided sciatica   3. Bilateral shoulder pain, unspecified chronicity     Plan: Impression is bilateral arm pain left worse than right which could be bursitis.  We talked about injections or further imaging.  She does have a little bit of spondylolisthesis in the neck at C5-6.  I want to  try her with physical therapy for this spondylosis of the cervical spine which is likely affecting her shoulders.  Her shoulder exam is pretty benign does not look like she has rotator cuff pathology impingement or frozen shoulder.  In regard to her low back she does have a little bit of spondylolisthesis at L4-5 as well as facet arthritis.  That's something that could potentially be helped with an injection.  She would have to be off the Xarelto in order to get that injection.  We'll try physical therapy for this as well.  MRI scanning of the neck and/or back with possible injections to follow is the next step for this problem.  Follow-Up Instructions: No Follow-up on file.   Orders:  Orders Placed This Encounter  Procedures  . XR Cervical Spine 2 or 3 views  . XR Lumbar Spine 2-3 Views   No orders of the defined types were placed in this encounter.     Procedures: No procedures performed   Clinical Data: No additional findings.  Objective: Vital Signs: There were no vitals taken for this visit.  Physical Exam:   Constitutional: Patient appears well-developed HEENT:  Head: Normocephalic Eyes:EOM are normal Neck: Normal range of motion Cardiovascular: Normal rate  Pulmonary/chest: Effort normal Neurologic: Patient is alert Skin: Skin is warm Psychiatric: Patient has normal mood and affect    Ortho Exam: Orthopedic exam demonstrates pretty reasonable cervical spine range of motion 5 out of 5 grip EPL FPL interosseous wrist flexion-extension biceps triceps and deltoid strength.  Radial pulses intact bilaterally.  Shoulder range of motion is pretty full and normal symmetrically bilaterally.  Good rotator cuff strength is noted.  Impingement signs equivocal bilaterally.  There is no acromioclavicular joint tenderness on either side.  No course grinding or crepitus with passive range of motion of either shoulder.  Reflexes symmetric bilateral biceps and triceps.  No definite  paresthesias C5-T1.  Back exam demonstrates mild tenderness with forward lateral bending but no trochanteric tenderness is noted.  Pedal pulses palpable.  Reflexes symmetric.  Negative Babinski negative clonus bilaterally.  No muscle atrophy in the legs.  No definite paresthesias L1 S1 bilaterally.  Specialty Comments:  No specialty comments available.  Imaging: Xr Cervical Spine 2 Or 3 Views  Result Date: 02/11/2017 AP lateral cervical spine reviewed.  Patient has grade 1 spondylolisthesis at C5-6 with some joint space narrowing present.  Otherwise there is normal lordosis and maintenance of disc spaces.  Mild facet arthritis is present.  Xr Lumbar Spine 2-3 Views  Result Date: 02/11/2017 AP lateral lumbar spine reviewed.  Patient has facet arthritis in the lower lumbar levels.  Grade 1 spondylolisthesis at L4-5 is present.  Bony pelvis otherwise normal.  Hip joints maintained.    PMFS History: There are no active problems to display for this patient.  Past Medical History:  Diagnosis Date  . Diabetes mellitus without complication ([place])   . GERD (gastroesophageal reflux disease)   . Headache   . Hypertension     No family history on file.  Past Surgical History:  Procedure Laterality Date  . ABDOMINAL HYSTERECTOMY    . CESAREAN SECTION  1987  . COLONOSCOPY WITH PROPOFOL N/A 05/08/2016   Procedure: COLONOSCOPY WITH PROPOFOL;  Surgeon: Carol Ada, MD;  Location: [facility];  Service: Endoscopy;  Laterality: N/A;   Social History   Occupational History  . Not on file.   Social History Main Topics  . Smoking status: Never Smoker  . Smokeless tobacco: Never Used  . Alcohol use No  . Drug use: No  . Sexual activity: Not on file

## 2017-03-16 ENCOUNTER — Other Ambulatory Visit: Payer: Self-pay | Admitting: *Deleted

## 2017-03-16 DIAGNOSIS — I82813 Embolism and thrombosis of superficial veins of lower extremities, bilateral: Secondary | ICD-10-CM

## 2017-03-30 ENCOUNTER — Ambulatory Visit (HOSPITAL_COMMUNITY)
Admission: RE | Admit: 2017-03-30 | Discharge: 2017-03-30 | Disposition: A | Discharge status: Home / Self Care | Payer: BLUE CROSS/BLUE SHIELD | Source: Ambulatory Visit | Attending: Vascular Surgery | Admitting: Vascular Surgery

## 2017-03-30 DIAGNOSIS — I8393 Asymptomatic varicose veins of bilateral lower extremities: Secondary | ICD-10-CM | POA: Diagnosis not present

## 2017-03-30 DIAGNOSIS — I82813 Embolism and thrombosis of superficial veins of lower extremities, bilateral: Secondary | ICD-10-CM

## 2017-03-31 ENCOUNTER — Encounter: Payer: Self-pay | Admitting: Vascular Surgery

## 2017-04-06 ENCOUNTER — Encounter: Payer: Self-pay | Admitting: Vascular Surgery

## 2017-04-06 ENCOUNTER — Ambulatory Visit (INDEPENDENT_AMBULATORY_CARE_PROVIDER_SITE_OTHER): Payer: BLUE CROSS/BLUE SHIELD | Admitting: Vascular Surgery

## 2017-04-06 VITALS — BP 140/94 | HR 89 | Temp 97.8°F | Resp 18 | Ht 62.0 in | Wt 285.0 lb

## 2017-04-06 DIAGNOSIS — I83899 Varicose veins of unspecified lower extremities with other complications: Secondary | ICD-10-CM | POA: Diagnosis not present

## 2017-04-06 NOTE — Progress Notes (Signed)
Subjective:     Patient ID: Rhonda Roberts, female   DOB: April 24, 1956, 61 y.o.   MRN: [id number]  HPI This 61 year old female was referred by Barbaraann Share for evaluation of superficial thrombophlebitis in the mid calf Of the left leg. 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 Patient has had some mild discomfort in this area which is almost resolved. She has had chronic mild swelling in the left leg compared to the right leg. She has no previous history of DVT. She has been on an anticoagulant-Xarelto since the event in November. 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 She does not elastic compression stockings.  Past Medical History:  Diagnosis Date  . Diabetes mellitus without complication ([place])   . GERD (gastroesophageal reflux disease)   . Headache   . Hypertension     Social History  Substance Use Topics  . Smoking status: Former Smoker    Quit date: 11/23/2004  . Smokeless tobacco: Never Used  . Alcohol use No    History reviewed. No pertinent family history.  Allergies  Allergen Reactions  . Sulfa Antibiotics Itching, Swelling and Rash     Current Outpatient Prescriptions:  .  atorvastatin (LIPITOR) 20 MG tablet, Take 20 mg by mouth every evening., Disp: , Rfl:  .  Cholecalciferol (VITAMIN D) 2000 UNITS CAPS, Take 2,000 Units by mouth every morning. , Disp: , Rfl:  .  Cyanocobalamin (VITAMIN B 12 PO), Take 2 tablets by mouth daily. , Disp: , Rfl:  .  fluticasone (FLONASE) 50 MCG/ACT nasal spray, Place 1 spray into both nostrils daily., Disp: , Rfl:  .  JANUMET XR [phone] MG TB24, , Disp: , Rfl:  .  levocetirizine (XYZAL) 5 MG tablet, Take 5 mg by mouth at bedtime., Disp: , Rfl: 2 .  metoCLOPramide (REGLAN) 10 MG tablet, Take 10 mg by mouth 2 (two) times daily., Disp: , Rfl:  .  Multiple Vitamin  (MULTIVITAMIN WITH MINERALS) TABS, Take 1 tablet by mouth every morning., Disp: , Rfl:  .  Olmesartan-Amlodipine-HCTZ (TRIBENZOR) 40-5-25 MG TABS, Take 1 tablet by mouth every evening., Disp: , Rfl:  .  omeprazole (PRILOSEC) 20 MG capsule, Take 20 mg by mouth 2 (two) times daily before a meal., Disp: , Rfl:  .  Rivaroxaban 15 & 20 MG TBPK, Take as directed on package: Start with one 15mg  tablet by mouth twice a day with food. On Day 22, switch to one 20mg  tablet once a day with food., Disp: 51 each, Rfl: 0 .  traMADol (ULTRAM) 50 MG tablet, Take 1 tablet (50 mg total) by mouth every 6 (six) hours as needed., Disp: 15 tablet, Rfl: 0 .  TRESIBA FLEXTOUCH 200 UNIT/ML SOPN, Inject 65 Units into the skin 2 (two) times daily as needed (if blood sugar levels are above 150). , Disp: , Rfl: 0 .  amoxicillin (AMOXIL) 500 MG capsule, Take 1,000 mg by mouth 2 (two) times daily. Started 11/10 for, Disp: , Rfl:  .  aspirin 81 MG chewable tablet, Chew 81 mg by mouth daily., Disp: , Rfl:  .  doxycycline (VIBRAMYCIN) 100 MG capsule, Take 1 capsule (100 mg total) by mouth 2 (two) times daily. (Patient not taking: Reported on 04/06/2017), Disp: 14 capsule, Rfl: 0 .  guaifenesin (ROBITUSSIN) 100 MG/5ML syrup, Take 200  mg by mouth every 6 (six) hours as needed for cough., Disp: , Rfl:  .  ibuprofen (ADVIL,MOTRIN) 200 MG tablet, Take 400 mg by mouth every 6 (six) hours as needed for fever, headache, mild pain, moderate pain or cramping. , Disp: , Rfl:  .  metroNIDAZOLE (FLAGYL) 500 MG tablet, Take 500 mg by mouth 2 (two) times daily. Started 11/10 for 14 days, Disp: , Rfl:  .  promethazine (PHENERGAN) 25 MG tablet, Take 1 tablet (25 mg total) by mouth every 6 (six) hours as needed for nausea. (Patient not taking: Reported on 04/06/2017), Disp: 12 tablet, Rfl: 0  Vitals:   04/06/17 1120 04/06/17 1121  BP: (!) 143/92 (!) 140/94  Pulse: 89   Resp: 18   Temp: 97.8 F (36.6 C)   TempSrc: Oral   SpO2: 96%   Weight: 285 lb  (129.3 kg)   Height: 5\' 2"  (1.575 m)     Body mass index is 52.13 kg/m.        Review of Systems Patient has history of type 1 diabetes mellitus, hypertension, chronic obesity, arthritis. Denies active chest discomfort, dyspnea on exertion, claudication. See history of present illness    Objective:   Physical Exam BP (!) 140/94 Comment: recheck  Pulse 89   Temp 97.8 F (36.6 C) (Oral)   Resp 18   Ht 5\' 2"  (1.575 m)   Wt 285 lb (129.3 kg)   SpO2 96%   BMI 52.13 kg/m     Gen.-alert and oriented x3 in no apparent distress-obese HEENT normal for age Lungs no rhonchi or wheezing Cardiovascular regular rhythm no murmurs carotid pulses 3+ palpable no bruits audible Abdomen soft nontender no palpable masses-obese Musculoskeletal free of  major deformities Skin clear -no rashes Neurologic normal Lower extremities 3+ femoral and dorsalis pedis pulses palpable bilaterally with no edema on the right trace edema on the left Very mild discomfort to deep palpation in mid calf on the left leg where the thrombophlebitis occurred. No bulging varicosities are noted.  Today I performed a bedside SonoSite ultrasound exam and the patient had a formal venous reflux exam in our office on May 8. This revealed some degree of reflux in the superficial venous system with caliber of vein maximally of 0.62 on the right and 0.57 on the left. There is no DVT.       Assessment:     Remote episode of thrombophlebitis left calf with no DVT-currently onXarelto-evidence of mild reflux in left great saphenous system Diabetes mellitus type 1 Hypertension    Plan:     Would not recommend laser ablation of the left great saphenous vein at this time since she has only had one episode and it has resolved. Elevate foot of bed 4 edema left leg Consider short leg elastic compression stockings 20-30 millimeter gradient although because of her obesity and large leg size this may be difficult Return to see  me on a when necessary basis Discontinue Xarelto and begin  daily aspirin

## 2017-04-07 ENCOUNTER — Encounter: Payer: Self-pay | Admitting: Family

## 2020-03-01 ENCOUNTER — Encounter (HOSPITAL_COMMUNITY): Payer: Self-pay | Admitting: *Deleted

## 2020-03-01 ENCOUNTER — Inpatient Hospital Stay (HOSPITAL_COMMUNITY)
Admission: EM | Admit: 2020-03-01 | Discharge: 2020-03-15 | DRG: 233 | Disposition: A | Discharge status: Home Health | Payer: Self-pay | Attending: Cardiothoracic Surgery | Admitting: Cardiothoracic Surgery

## 2020-03-01 ENCOUNTER — Other Ambulatory Visit: Payer: Self-pay

## 2020-03-01 ENCOUNTER — Emergency Department (HOSPITAL_COMMUNITY): Payer: Self-pay

## 2020-03-01 DIAGNOSIS — D62 Acute posthemorrhagic anemia: Secondary | ICD-10-CM | POA: Diagnosis not present

## 2020-03-01 DIAGNOSIS — N1831 Chronic kidney disease, stage 3a: Secondary | ICD-10-CM | POA: Diagnosis present

## 2020-03-01 DIAGNOSIS — J9600 Acute respiratory failure, unspecified whether with hypoxia or hypercapnia: Secondary | ICD-10-CM | POA: Diagnosis not present

## 2020-03-01 DIAGNOSIS — N390 Urinary tract infection, site not specified: Secondary | ICD-10-CM | POA: Diagnosis not present

## 2020-03-01 DIAGNOSIS — E1165 Type 2 diabetes mellitus with hyperglycemia: Secondary | ICD-10-CM | POA: Diagnosis present

## 2020-03-01 DIAGNOSIS — I161 Hypertensive emergency: Secondary | ICD-10-CM

## 2020-03-01 DIAGNOSIS — E785 Hyperlipidemia, unspecified: Secondary | ICD-10-CM | POA: Diagnosis present

## 2020-03-01 DIAGNOSIS — Z9889 Other specified postprocedural states: Secondary | ICD-10-CM

## 2020-03-01 DIAGNOSIS — I5A Non-ischemic myocardial injury (non-traumatic): Secondary | ICD-10-CM

## 2020-03-01 DIAGNOSIS — Z7982 Long term (current) use of aspirin: Secondary | ICD-10-CM

## 2020-03-01 DIAGNOSIS — Z20822 Contact with and (suspected) exposure to covid-19: Secondary | ICD-10-CM | POA: Diagnosis present

## 2020-03-01 DIAGNOSIS — Z794 Long term (current) use of insulin: Secondary | ICD-10-CM

## 2020-03-01 DIAGNOSIS — I2510 Atherosclerotic heart disease of native coronary artery without angina pectoris: Principal | ICD-10-CM | POA: Diagnosis present

## 2020-03-01 DIAGNOSIS — Z87891 Personal history of nicotine dependence: Secondary | ICD-10-CM

## 2020-03-01 DIAGNOSIS — Z833 Family history of diabetes mellitus: Secondary | ICD-10-CM

## 2020-03-01 DIAGNOSIS — N179 Acute kidney failure, unspecified: Secondary | ICD-10-CM | POA: Diagnosis not present

## 2020-03-01 DIAGNOSIS — E1122 Type 2 diabetes mellitus with diabetic chronic kidney disease: Secondary | ICD-10-CM | POA: Diagnosis present

## 2020-03-01 DIAGNOSIS — Z6841 Body Mass Index (BMI) 40.0 and over, adult: Secondary | ICD-10-CM

## 2020-03-01 DIAGNOSIS — E876 Hypokalemia: Secondary | ICD-10-CM | POA: Diagnosis not present

## 2020-03-01 DIAGNOSIS — I25119 Atherosclerotic heart disease of native coronary artery with unspecified angina pectoris: Secondary | ICD-10-CM

## 2020-03-01 DIAGNOSIS — Z951 Presence of aortocoronary bypass graft: Secondary | ICD-10-CM

## 2020-03-01 DIAGNOSIS — I130 Hypertensive heart and chronic kidney disease with heart failure and stage 1 through stage 4 chronic kidney disease, or unspecified chronic kidney disease: Secondary | ICD-10-CM | POA: Diagnosis present

## 2020-03-01 DIAGNOSIS — I252 Old myocardial infarction: Secondary | ICD-10-CM

## 2020-03-01 DIAGNOSIS — A084 Viral intestinal infection, unspecified: Secondary | ICD-10-CM | POA: Diagnosis not present

## 2020-03-01 DIAGNOSIS — I471 Supraventricular tachycardia: Secondary | ICD-10-CM | POA: Diagnosis present

## 2020-03-01 DIAGNOSIS — K219 Gastro-esophageal reflux disease without esophagitis: Secondary | ICD-10-CM | POA: Diagnosis present

## 2020-03-01 DIAGNOSIS — I5021 Acute systolic (congestive) heart failure: Secondary | ICD-10-CM | POA: Diagnosis present

## 2020-03-01 DIAGNOSIS — I214 Non-ST elevation (NSTEMI) myocardial infarction: Secondary | ICD-10-CM | POA: Diagnosis present

## 2020-03-01 DIAGNOSIS — K529 Noninfective gastroenteritis and colitis, unspecified: Secondary | ICD-10-CM | POA: Diagnosis present

## 2020-03-01 DIAGNOSIS — I255 Ischemic cardiomyopathy: Secondary | ICD-10-CM | POA: Diagnosis present

## 2020-03-01 DIAGNOSIS — I5042 Chronic combined systolic (congestive) and diastolic (congestive) heart failure: Secondary | ICD-10-CM

## 2020-03-01 DIAGNOSIS — S2690XA Unspecified injury of heart, unspecified with or without hemopericardium, initial encounter: Secondary | ICD-10-CM | POA: Diagnosis present

## 2020-03-01 DIAGNOSIS — T502X5A Adverse effect of carbonic-anhydrase inhibitors, benzothiadiazides and other diuretics, initial encounter: Secondary | ICD-10-CM | POA: Diagnosis present

## 2020-03-01 DIAGNOSIS — Z882 Allergy status to sulfonamides status: Secondary | ICD-10-CM

## 2020-03-01 LAB — COMPREHENSIVE METABOLIC PANEL
ALT: 24 U/L (ref 0–44)
AST: 36 U/L (ref 15–41)
Albumin: 3.8 g/dL (ref 3.5–5.0)
Alkaline Phosphatase: 113 U/L (ref 38–126)
Anion gap: 10 (ref 5–15)
BUN: 23 mg/dL (ref 8–23)
CO2: 27 mmol/L (ref 22–32)
Calcium: 9.2 mg/dL (ref 8.9–10.3)
Chloride: 98 mmol/L (ref 98–111)
Creatinine, Ser: 1.28 mg/dL — ABNORMAL HIGH (ref 0.44–1.00)
GFR calc Af Amer: 51 mL/min — ABNORMAL LOW (ref >60)
GFR calc non Af Amer: 44 mL/min — ABNORMAL LOW (ref >60)
Glucose, Bld: 286 mg/dL — ABNORMAL HIGH (ref 70–99)
Potassium: 3.9 mmol/L (ref 3.5–5.1)
Sodium: 135 mmol/L (ref 135–145)
Total Bilirubin: 0.6 mg/dL (ref 0.3–1.2)
Total Protein: 9 g/dL — ABNORMAL HIGH (ref 6.5–8.1)

## 2020-03-01 LAB — CBC
HCT: 44 % (ref 36.0–46.0)
Hemoglobin: 14.3 g/dL (ref 12.0–15.0)
MCH: 28.7 pg (ref 26.0–34.0)
MCHC: 32.5 g/dL (ref 30.0–36.0)
MCV: 88.2 fL (ref 80.0–100.0)
Platelets: 294 10*3/uL (ref 150–400)
RBC: 4.99 MIL/uL (ref 3.87–5.11)
RDW: 14.6 % (ref 11.5–15.5)
WBC: 11.1 10*3/uL — ABNORMAL HIGH (ref 4.0–10.5)
nRBC: 0 % (ref 0.0–0.2)

## 2020-03-01 LAB — TROPONIN I (HIGH SENSITIVITY)
Troponin I (High Sensitivity): 2225 ng/L (ref <18)
Troponin I (High Sensitivity): 3272 ng/L (ref <18)

## 2020-03-01 LAB — APTT: aPTT: 29 seconds (ref 24–36)

## 2020-03-01 LAB — LIPASE, BLOOD: Lipase: 22 U/L (ref 11–51)

## 2020-03-01 LAB — PROTIME-INR
INR: 1 (ref 0.8–1.2)
Prothrombin Time: 13.1 seconds (ref 11.4–15.2)

## 2020-03-01 LAB — CBG MONITORING, ED: Glucose-Capillary: 266 mg/dL — ABNORMAL HIGH (ref 70–99)

## 2020-03-01 MED ORDER — SODIUM CHLORIDE (PF) 0.9 % IJ SOLN
INTRAMUSCULAR | Status: AC
  Filled 2020-03-01: qty 50

## 2020-03-01 MED ORDER — ONDANSETRON HCL 4 MG/2ML IJ SOLN
4.0000 mg | Freq: Once | INTRAMUSCULAR | Status: AC
Start: 2020-03-01 — End: 2020-03-01
  Administered 2020-03-01: 20:00:00 4 mg via INTRAVENOUS
  Filled 2020-03-01: qty 2

## 2020-03-01 MED ORDER — ASPIRIN 325 MG PO TABS
325.0000 mg | ORAL_TABLET | Freq: Once | ORAL | Status: AC
  Administered 2020-03-01: 23:00:00 325 mg via ORAL
  Filled 2020-03-01: qty 1

## 2020-03-01 MED ORDER — HEPARIN (PORCINE) 25000 UT/250ML-% IV SOLN
1250.0000 [IU]/h | INTRAVENOUS | Status: DC
  Administered 2020-03-02: 15:00:00 1250 [IU]/h via INTRAVENOUS
  Administered 2020-03-02: 1400 [IU]/h via INTRAVENOUS
  Administered 2020-03-04: 1250 [IU]/h via INTRAVENOUS
  Filled 2020-03-01 (×4): qty 250

## 2020-03-01 MED ORDER — LABETALOL HCL 5 MG/ML IV SOLN
10.0000 mg | Freq: Once | INTRAVENOUS | Status: AC
  Administered 2020-03-01: 20:00:00 10 mg via INTRAVENOUS
  Filled 2020-03-01: qty 4

## 2020-03-01 MED ORDER — SODIUM CHLORIDE 0.9% FLUSH
3.0000 mL | Freq: Once | INTRAVENOUS | Status: AC
  Administered 2020-03-01: 3 mL via INTRAVENOUS

## 2020-03-01 MED ORDER — IOHEXOL 350 MG/ML SOLN
100.0000 mL | Freq: Once | INTRAVENOUS | Status: AC | PRN
  Administered 2020-03-01: 100 mL via INTRAVENOUS

## 2020-03-01 MED ORDER — SODIUM CHLORIDE 0.9 % IV BOLUS
1000.0000 mL | Freq: Once | INTRAVENOUS | Status: AC
  Administered 2020-03-01: 21:00:00 1000 mL via INTRAVENOUS

## 2020-03-01 MED ORDER — NITROGLYCERIN IN D5W 200-5 MCG/ML-% IV SOLN
0.0000 ug/min | INTRAVENOUS | Status: DC
  Administered 2020-03-01: 25 ug/min via INTRAVENOUS
  Filled 2020-03-01 (×2): qty 250

## 2020-03-01 NOTE — ED Provider Notes (Addendum)
[facility] DEPT Provider Note   CSN: [id number] Arrival date & time: 03/01/20  1713     History Chief Complaint  Patient presents with  . Abdominal Pain  . Nausea  . Emesis  . Diarrhea    Rhonda Roberts is a 64 y.o. female history of diabetes, reflux, hypertension here presenting with chest pain, nausea or vomiting. Patient states that she has been having some nausea vomiting and diarrhea for the last 5 days. She states that she has some loose stools and greenish stools as well. She also has some associated chest pressure as well.  She states that the pain is mainly in the epigastric area but does radiate to her chest.  She denies any back pain.  She denies any history of CAD or stent.  The history is provided by the patient.       Past Medical History:  Diagnosis Date  . Diabetes mellitus without complication ([place])   . GERD (gastroesophageal reflux disease)   . Headache   . Hypertension     There are no problems to display for this patient.   Past Surgical History:  Procedure Laterality Date  . ABDOMINAL HYSTERECTOMY    . CESAREAN SECTION  1987  . COLONOSCOPY WITH PROPOFOL N/A 05/08/2016   Procedure: COLONOSCOPY WITH PROPOFOL;  Surgeon: Carol Ada, MD;  Location: [facility];  Service: Endoscopy;  Laterality: N/A;     OB History   No obstetric history on file.     No family history on file.  Social History   Tobacco Use  . Smoking status: Former Smoker    Quit date: 11/23/2004    Years since quitting: 15.2  . Smokeless tobacco: Never Used  Substance Use Topics  . Alcohol use: No  . Drug use: No    Home Medications Prior to Admission medications   Medication Sig Start Date End Date Taking? Authorizing Provider  amoxicillin (AMOXIL) 500 MG capsule Take 1,000 mg by mouth 2 (two) times daily. Started 11/10 for    [provider]  aspirin 81 MG chewable tablet Chew 81 mg by mouth daily.    [provider]   atorvastatin (LIPITOR) 20 MG tablet Take 20 mg by mouth every evening.    [provider]  Cholecalciferol (VITAMIN D) 2000 UNITS CAPS Take 2,000 Units by mouth every morning.     [provider]  Cyanocobalamin (VITAMIN B 12 PO) Take 2 tablets by mouth daily.     [provider]  doxycycline (VIBRAMYCIN) 100 MG capsule Take 1 capsule (100 mg total) by mouth 2 (two) times daily. Patient not taking: Reported on 04/06/2017 10/19/16   de Villier, Daryl F II, PA  fluticasone (FLONASE) 50 MCG/ACT nasal spray Place 1 spray into both nostrils daily.    [provider]  guaifenesin (ROBITUSSIN) 100 MG/5ML syrup Take 200 mg by mouth every 6 (six) hours as needed for cough.    [provider]  ibuprofen (ADVIL,MOTRIN) 200 MG tablet Take 400 mg by mouth every 6 (six) hours as needed for fever, headache, mild pain, moderate pain or cramping.     [provider]  JANUMET XR [phone] MG TB24  01/14/17   [provider]  levocetirizine (XYZAL) 5 MG tablet Take 5 mg by mouth at bedtime. 02/27/16   [provider]  metoCLOPramide (REGLAN) 10 MG tablet Take 10 mg by mouth 2 (two) times daily.    [provider]  metroNIDAZOLE (FLAGYL) 500  MG tablet Take 500 mg by mouth 2 (two) times daily. Started 11/10 for 14 days    [provider]  Multiple Vitamin (MULTIVITAMIN WITH MINERALS) TABS Take 1 tablet by mouth every morning.    [provider]  Olmesartan-Amlodipine-HCTZ (TRIBENZOR) 40-5-25 MG TABS Take 1 tablet by mouth every evening.    [provider]  omeprazole (PRILOSEC) 20 MG capsule Take 20 mg by mouth 2 (two) times daily before a meal.    [provider]  promethazine (PHENERGAN) 25 MG tablet Take 1 tablet (25 mg total) by mouth every 6 (six) hours as needed for nausea. Patient not taking: Reported on 04/06/2017 05/20/13   Cleatrice Burke, PA-C  Rivaroxaban 15 & 20 MG TBPK Take as directed on  package: Start with one 15mg  tablet by mouth twice a day with food. On Day 22, switch to one 20mg  tablet once a day with food. 10/19/16   de Villier, Daryl F II, PA  traMADol (ULTRAM) 50 MG tablet Take 1 tablet (50 mg total) by mouth every 6 (six) hours as needed. 10/19/16   de Villier, Daryl F II, PA  TRESIBA FLEXTOUCH 200 UNIT/ML SOPN Inject 65 Units into the skin 2 (two) times daily as needed (if blood sugar levels are above 150).  03/17/16   [provider]    Allergies    Sulfa antibiotics  Review of Systems   Review of Systems  Gastrointestinal: Positive for abdominal pain, diarrhea and vomiting.  All other systems reviewed and are negative.   Physical Exam Updated Vital Signs BP (!) 174/103 (BP Location: Right Arm)   Pulse 92   Temp 98.1 F (36.7 C) (Oral)   Resp (!) 28   Ht 5\' 2"  (1.575 m)   Wt 130.2 kg   SpO2 96%   BMI 52.49 kg/m   Physical Exam Vitals and nursing note reviewed.  Constitutional:      Comments: Uncomfortable   HENT:     Head: Normocephalic.     Mouth/Throat:     Pharynx: Oropharynx is clear.  Eyes:     Extraocular Movements: Extraocular movements intact.  Cardiovascular:     Rate and Rhythm: Normal rate and regular rhythm.     Heart sounds: Normal heart sounds.  Pulmonary:     Effort: Pulmonary effort is normal.     Breath sounds: Normal breath sounds.  Abdominal:     General: Abdomen is flat.     Comments: Minimal epigastric tenderness   Skin:    General: Skin is warm.     Capillary Refill: Capillary refill takes less than 2 seconds.     Comments: Good peripheral pulses   Neurological:     General: No focal deficit present.     Mental Status: She is oriented to person, place, and time.  Psychiatric:        Mood and Affect: Mood normal.        Behavior: Behavior normal.     ED Results / Procedures / Treatments   Labs (all labs ordered are listed, but only abnormal results are displayed) Labs Reviewed  COMPREHENSIVE  METABOLIC PANEL - Abnormal; Notable for the following components:      Result Value   Glucose, Bld 286 (*)    Creatinine, Ser 1.28 (*)    Total Protein 9.0 (*)    GFR calc non Af Amer 44 (*)    GFR calc Af Amer 51 (*)    All other components within normal limits  CBC - Abnormal; Notable for the following components:   WBC 11.1 (*)    All other components within normal limits  CBG MONITORING, ED - Abnormal; Notable for the following components:   Glucose-Capillary 266 (*)    All other components within normal limits  TROPONIN I (HIGH SENSITIVITY) - Abnormal; Notable for the following components:   Troponin I (High Sensitivity) 2,225 (*)    All other components within normal limits  LIPASE, BLOOD  URINALYSIS, ROUTINE W REFLEX MICROSCOPIC  TROPONIN I (HIGH SENSITIVITY)    EKG EKG Interpretation  Date/Time:  Friday March 01 2020 20:08:10 EDT Ventricular Rate:  111 PR Interval:    QRS Duration: 89 QT Interval:  351 QTC Calculation: 477 R Axis:   -31 Text Interpretation: Sinus tachycardia Left axis deviation Anteroseptal infarct, age indeterminate Lateral leads are also involved No previous ECGs available Confirmed by Wandra Arthurs [id number]) on 03/01/2020 8:10:51 PM   Radiology DG Chest 2 View  Result Date: 03/01/2020 CLINICAL DATA:  Chest pain EXAM: CHEST - 2 VIEW COMPARISON:  12/18/2016 FINDINGS: Peribronchial thickening and interstitial prominence. Heart is normal size. No effusions or acute bony abnormality. IMPRESSION: Bronchitic changes. Electronically Signed   By: Rolm Baptise M.D.   On: 03/01/2020 18:58    Procedures Procedures (including critical care time)   CRITICAL CARE Performed by: Wandra Arthurs   Total critical care time: 40 minutes  Critical care time was exclusive of separately billable procedures and treating other patients.  Critical care was necessary to treat or prevent imminent or life-threatening deterioration.  Critical care was time spent personally  by me on the following activities: development of treatment plan with patient and/or surrogate as well as nursing, discussions with consultants, evaluation of patient's response to treatment, examination of patient, obtaining history from patient or surrogate, ordering and performing treatments and interventions, ordering and review of laboratory studies, ordering and review of radiographic studies, pulse oximetry and re-evaluation of patient's condition.   Medications Ordered in ED Medications  sodium chloride flush (NS) 0.9 % injection 3 mL (has no administration in time range)  sodium chloride (PF) 0.9 % injection (has no administration in time range)  sodium chloride 0.9 % bolus 1,000 mL (1,000 mLs Intravenous New Bag/Given (Non-Interop) 03/01/20 2030)  ondansetron (ZOFRAN) injection 4 mg (4 mg Intravenous Given 03/01/20 2029)  labetalol (NORMODYNE) injection 10 mg (10 mg Intravenous Given 03/01/20 2029)  iohexol (OMNIPAQUE) 350 MG/ML injection 100 mL (100 mLs Intravenous Contrast Given 03/01/20 2035)    ED Course  I have reviewed the triage vital signs and the nursing notes.  Pertinent labs & imaging results that were available during my care of the patient were reviewed by me and considered in my medical decision making (see chart for details).    MDM Rules/Calculators/A&P                      Rhonda Roberts is a 64 y.o. female here presenting with nausea and vomiting and diarrhea and chest pain.  I think likely gastroenteritis symptoms. Chest pain may be from vomiting or reflux. She is also very hypertensive in the ED.    concern for hypertensive urgency as well.  Will get labs and troponin and chest x-ray and CT abdomen pelvis.  8 pm Troponin came back at 2000. Repeat EKG showed ST depressions lateral leads.  I talked to cardiologist Dr. Kalman Shan. He agreed that patient does not have a STEMI right now and likely  has demand ischemia from hypertension. Will give labetalol for hypertensive  emergency.  I ordered a dissection study as well.  9:51 PM Troponin increased to 3200.  Repeat EKG showed the ST depressions actually improved.  Her blood pressure went down to the 160s.  I reviewed her lab work and CT.  She has no dissection on CT.  Given rising troponin and elevated blood pressure, I think she likely has hypertensive emergency and possible NSTEMI.  Patient is started on nitro drip and heparin drip.  I talked to Dr. Kalman Shan again. Since there are no beds at home currently and patient has a rising troponin, patient will benefit from a urgent cardiology consult so we will arrange for ED to ED transfer. I talked to Dr. Regenia Skeeter and the ED who is the accepting doctor.   12 AM Patient's BP down to 140s. Patient went to [facility] ED by carelink   Final Clinical Impression(s) / ED Diagnoses Final diagnoses:  None    Rx / DC Orders ED Discharge Orders    None       Drenda Freeze, MD 03/01/20 2153    Drenda Freeze, MD 03/02/20 [phone]

## 2020-03-01 NOTE — ED Triage Notes (Signed)
N/V/D with generalized pain since Sunday, at times diarrhea is very dark, today is turning back to a green, Chest pain on and off all week

## 2020-03-01 NOTE — ED Notes (Signed)
Date and time results received: 03/01/20 1958 (use smartphrase ".now" to insert current time)  Test: Troponin Critical Value: 2,225  Name of Provider Notified:  Orders Received? Or Actions Taken?: waiting on orders

## 2020-03-01 NOTE — ED Notes (Signed)
Date and time results received: 03/01/20 09:23 pm (use smartphrase ".now" to insert current time)  Test: troponin  Critical Value: 3,272  Name of Provider Notified: Dr. Darl Householder  Orders Received? No Or Actions Taken:

## 2020-03-02 ENCOUNTER — Other Ambulatory Visit: Payer: Self-pay

## 2020-03-02 ENCOUNTER — Encounter (HOSPITAL_COMMUNITY): Payer: Self-pay | Admitting: Internal Medicine

## 2020-03-02 ENCOUNTER — Other Ambulatory Visit (HOSPITAL_COMMUNITY): Payer: Self-pay

## 2020-03-02 ENCOUNTER — Observation Stay (HOSPITAL_COMMUNITY): Payer: Self-pay

## 2020-03-02 DIAGNOSIS — R079 Chest pain, unspecified: Secondary | ICD-10-CM

## 2020-03-02 DIAGNOSIS — E1165 Type 2 diabetes mellitus with hyperglycemia: Secondary | ICD-10-CM | POA: Diagnosis present

## 2020-03-02 DIAGNOSIS — I5A Non-ischemic myocardial injury (non-traumatic): Secondary | ICD-10-CM | POA: Diagnosis present

## 2020-03-02 DIAGNOSIS — S2690XA Unspecified injury of heart, unspecified with or without hemopericardium, initial encounter: Secondary | ICD-10-CM | POA: Diagnosis present

## 2020-03-02 DIAGNOSIS — I161 Hypertensive emergency: Principal | ICD-10-CM

## 2020-03-02 DIAGNOSIS — K529 Noninfective gastroenteritis and colitis, unspecified: Secondary | ICD-10-CM | POA: Diagnosis present

## 2020-03-02 DIAGNOSIS — E785 Hyperlipidemia, unspecified: Secondary | ICD-10-CM | POA: Diagnosis present

## 2020-03-02 DIAGNOSIS — K219 Gastro-esophageal reflux disease without esophagitis: Secondary | ICD-10-CM | POA: Diagnosis present

## 2020-03-02 DIAGNOSIS — I214 Non-ST elevation (NSTEMI) myocardial infarction: Secondary | ICD-10-CM

## 2020-03-02 DIAGNOSIS — Z6841 Body Mass Index (BMI) 40.0 and over, adult: Secondary | ICD-10-CM

## 2020-03-02 LAB — URINALYSIS, ROUTINE W REFLEX MICROSCOPIC
Bacteria, UA: NONE SEEN
Bilirubin Urine: NEGATIVE
Glucose, UA: 50 mg/dL — AB
Ketones, ur: NEGATIVE mg/dL
Leukocytes,Ua: NEGATIVE
Nitrite: NEGATIVE
Protein, ur: 100 mg/dL — AB
Specific Gravity, Urine: 1.041 — ABNORMAL HIGH (ref 1.005–1.030)
pH: 5 (ref 5.0–8.0)

## 2020-03-02 LAB — BASIC METABOLIC PANEL
Anion gap: 13 (ref 5–15)
BUN: 21 mg/dL (ref 8–23)
CO2: 23 mmol/L (ref 22–32)
Calcium: 8.7 mg/dL — ABNORMAL LOW (ref 8.9–10.3)
Chloride: 101 mmol/L (ref 98–111)
Creatinine, Ser: 1.22 mg/dL — ABNORMAL HIGH (ref 0.44–1.00)
GFR calc Af Amer: 54 mL/min — ABNORMAL LOW (ref >60)
GFR calc non Af Amer: 47 mL/min — ABNORMAL LOW (ref >60)
Glucose, Bld: 244 mg/dL — ABNORMAL HIGH (ref 70–99)
Potassium: 3.7 mmol/L (ref 3.5–5.1)
Sodium: 137 mmol/L (ref 135–145)

## 2020-03-02 LAB — LIPID PANEL
Cholesterol: 216 mg/dL — ABNORMAL HIGH (ref 0–200)
HDL: 36 mg/dL — ABNORMAL LOW (ref >40)
LDL Cholesterol: 159 mg/dL — ABNORMAL HIGH (ref 0–99)
Total CHOL/HDL Ratio: 6 RATIO
Triglycerides: 106 mg/dL (ref <150)
VLDL: 21 mg/dL (ref 0–40)

## 2020-03-02 LAB — ECHOCARDIOGRAM COMPLETE
Height: 62 in
Weight: 4780.8 oz

## 2020-03-02 LAB — POC OCCULT BLOOD, ED: Fecal Occult Bld: NEGATIVE

## 2020-03-02 LAB — HEPARIN LEVEL (UNFRACTIONATED)
Heparin Unfractionated: <0.1 IU/mL — ABNORMAL LOW (ref 0.30–0.70)
Heparin Unfractionated: 0.81 IU/mL — ABNORMAL HIGH (ref 0.30–0.70)

## 2020-03-02 LAB — CBG MONITORING, ED: Glucose-Capillary: 224 mg/dL — ABNORMAL HIGH (ref 70–99)

## 2020-03-02 LAB — RESPIRATORY PANEL BY RT PCR (FLU A&B, COVID)
Influenza A by PCR: NEGATIVE
Influenza B by PCR: NEGATIVE
SARS Coronavirus 2 by RT PCR: NEGATIVE

## 2020-03-02 LAB — TROPONIN I (HIGH SENSITIVITY): Troponin I (High Sensitivity): 10478 ng/L (ref <18)

## 2020-03-02 LAB — HIV ANTIBODY (ROUTINE TESTING W REFLEX): HIV Screen 4th Generation wRfx: NONREACTIVE

## 2020-03-02 LAB — GLUCOSE, CAPILLARY
Glucose-Capillary: 213 mg/dL — ABNORMAL HIGH (ref 70–99)
Glucose-Capillary: 185 mg/dL — ABNORMAL HIGH (ref 70–99)
Glucose-Capillary: 196 mg/dL — ABNORMAL HIGH (ref 70–99)
Glucose-Capillary: 208 mg/dL — ABNORMAL HIGH (ref 70–99)

## 2020-03-02 MED ORDER — PERFLUTREN LIPID MICROSPHERE
1.0000 mL | INTRAVENOUS | Status: AC | PRN
  Administered 2020-03-02: 16:00:00 2 mL via INTRAVENOUS
  Filled 2020-03-02: qty 10

## 2020-03-02 MED ORDER — SODIUM CHLORIDE 0.9% FLUSH: 10.0000 mL | INTRAVENOUS | Status: DC | PRN

## 2020-03-02 MED ORDER — METOPROLOL TARTRATE 5 MG/5ML IV SOLN: 5.0000 mg | INTRAVENOUS | Status: DC | PRN

## 2020-03-02 MED ORDER — ONDANSETRON HCL 4 MG/2ML IJ SOLN
4.0000 mg | Freq: Four times a day (QID) | INTRAMUSCULAR | Status: DC | PRN
  Administered 2020-03-03 (×2): 4 mg via INTRAVENOUS
  Filled 2020-03-02 (×2): qty 2

## 2020-03-02 MED ORDER — INSULIN ASPART 100 UNIT/ML ~~LOC~~ SOLN
10.0000 [IU] | Freq: Three times a day (TID) | SUBCUTANEOUS | Status: DC
  Administered 2020-03-02 – 2020-03-05 (×4): 10 [IU] via SUBCUTANEOUS

## 2020-03-02 MED ORDER — HEPARIN SODIUM (PORCINE) 5000 UNIT/ML IJ SOLN
4000.0000 [IU] | Freq: Once | INTRAMUSCULAR | Status: AC
  Administered 2020-03-02: 4000 [IU] via INTRAVENOUS
  Filled 2020-03-02: qty 1

## 2020-03-02 MED ORDER — ATORVASTATIN CALCIUM 40 MG PO TABS: 40.0000 mg | ORAL_TABLET | Freq: Every day | ORAL | Status: DC

## 2020-03-02 MED ORDER — CARVEDILOL 6.25 MG PO TABS
6.2500 mg | ORAL_TABLET | Freq: Two times a day (BID) | ORAL | Status: DC
  Administered 2020-03-02 – 2020-03-05 (×8): 6.25 mg via ORAL
  Filled 2020-03-02 (×8): qty 1

## 2020-03-02 MED ORDER — INSULIN ASPART 100 UNIT/ML ~~LOC~~ SOLN
0.0000 [IU] | Freq: Three times a day (TID) | SUBCUTANEOUS | Status: DC
  Administered 2020-03-02 (×2): 4 [IU] via SUBCUTANEOUS
  Administered 2020-03-02: 10:00:00 7 [IU] via SUBCUTANEOUS
  Administered 2020-03-03 (×2): 4 [IU] via SUBCUTANEOUS
  Administered 2020-03-03: 13:00:00 7 [IU] via SUBCUTANEOUS
  Administered 2020-03-04: 3 [IU] via SUBCUTANEOUS
  Administered 2020-03-05 (×2): 4 [IU] via SUBCUTANEOUS
  Administered 2020-03-05: 3 [IU] via SUBCUTANEOUS

## 2020-03-02 MED ORDER — ENALAPRIL MALEATE 5 MG PO TABS
5.0000 mg | ORAL_TABLET | Freq: Two times a day (BID) | ORAL | Status: DC
  Administered 2020-03-02: 07:00:00 5 mg via ORAL
  Filled 2020-03-02 (×2): qty 1

## 2020-03-02 MED ORDER — ATORVASTATIN CALCIUM 80 MG PO TABS
80.0000 mg | ORAL_TABLET | Freq: Every day | ORAL | Status: DC
  Administered 2020-03-02 – 2020-03-14 (×13): 80 mg via ORAL
  Filled 2020-03-02 (×13): qty 1

## 2020-03-02 MED ORDER — ACETAMINOPHEN 325 MG PO TABS
650.0000 mg | ORAL_TABLET | ORAL | Status: DC | PRN
  Administered 2020-03-02: 22:00:00 650 mg via ORAL
  Filled 2020-03-02: qty 2

## 2020-03-02 MED ORDER — INSULIN NPH (HUMAN) (ISOPHANE) 100 UNIT/ML ~~LOC~~ SUSP
40.0000 [IU] | Freq: Two times a day (BID) | SUBCUTANEOUS | Status: DC
  Administered 2020-03-02 – 2020-03-05 (×5): 40 [IU] via SUBCUTANEOUS
  Filled 2020-03-02 (×2): qty 10

## 2020-03-02 MED ORDER — METFORMIN HCL 500 MG PO TABS
500.0000 mg | ORAL_TABLET | Freq: Two times a day (BID) | ORAL | Status: DC
  Administered 2020-03-02: 500 mg via ORAL
  Filled 2020-03-02 (×2): qty 1

## 2020-03-02 MED ORDER — INSULIN GLARGINE 100 UNIT/ML ~~LOC~~ SOLN
40.0000 [IU] | Freq: Every day | SUBCUTANEOUS | Status: DC
  Filled 2020-03-02: qty 0.4

## 2020-03-02 MED ORDER — NITROGLYCERIN 0.4 MG SL SUBL: 0.4000 mg | SUBLINGUAL_TABLET | SUBLINGUAL | Status: DC | PRN

## 2020-03-02 MED ORDER — LOSARTAN POTASSIUM 25 MG PO TABS
25.0000 mg | ORAL_TABLET | Freq: Every day | ORAL | Status: DC
  Administered 2020-03-03: 25 mg via ORAL
  Filled 2020-03-02 (×2): qty 1

## 2020-03-02 MED ORDER — ASPIRIN 81 MG PO CHEW
81.0000 mg | CHEWABLE_TABLET | Freq: Every day | ORAL | Status: DC
  Administered 2020-03-02 – 2020-03-04 (×3): 81 mg via ORAL
  Filled 2020-03-02 (×3): qty 1

## 2020-03-02 MED ORDER — INSULIN ASPART 100 UNIT/ML ~~LOC~~ SOLN
0.0000 [IU] | Freq: Every day | SUBCUTANEOUS | Status: DC
  Administered 2020-03-02: 2 [IU] via SUBCUTANEOUS

## 2020-03-02 NOTE — Progress Notes (Signed)
CRITICAL VALUE ALERT  Critical Value:  Trop 10.478  Date & Time Notied:  03/02/2020 0530  Provider Notified: Kalman Shan  Orders Received/Actions taken:

## 2020-03-02 NOTE — ED Provider Notes (Signed)
12:50 AM  Pt transferred from [facility] with NSTEMI to see cardiology.  On evaluation, patient is currently on heparin infusion and nitroglycerin infusion.  She tells me that since Monday she has had vomiting, diarrhea and abdominal pain.  Wednesday started having chest pain and shortness of breath.  She has a chronic cough which is unchanged.  No known fevers or chills.  Has had her first Covid vaccination is scheduled for her second today.  No history of cardiac disease but does have morbid obesity and hypertension.  Chest pain now almost gone on nitroglycerin infusion.  She does report black tarry stools for the past several days that have now turned green but is Hemoccult negative here without gross blood or melena.  She is not on blood thinners.  Patient's first high-sensitivity troponin was 2225.  Repeat is 3372.  Repeat EKG has been independently reviewed and interpreted by myself and shows no STEMI.  Cardiology has been called to see patient in the ED.  Covid pending.  Blood pressure currently 110/82.  Abdominal exam benign.  CT dissection study showed no acute abnormality other than air in the bladder.  UA and urine culture pending.   1:07 AM  Spoke with Dr. Kalman Shan with cardiology who will see patient in ED.  2:30 AM  Pt's COVID is negative.  Dr. Kalman Shan with cardiology at bedside and will admit.  I reviewed all nursing notes and pertinent previous records as available.  I have interpreted any EKGs, lab and urine results, imaging (as available).    EKG Interpretation  Date/Time:  Friday March 01 2020 21:40:20 EDT Ventricular Rate:  91 PR Interval:    QRS Duration: 101 QT Interval:  384 QTC Calculation: 473 R Axis:   41 Text Interpretation: Sinus rhythm Abnrm T, consider ischemia, anterolateral lds No significant change since last tracing Confirmed by Wandra Arthurs [phone]) on 03/01/2020 9:43:36 PM         CRITICAL CARE Performed by: Cyril Mourning Alyha Marines   Total critical care time: 40  minutes  Critical care time was exclusive of separately billable procedures and treating other patients.  Critical care was necessary to treat or prevent imminent or life-threatening deterioration.  Critical care was time spent personally by me on the following activities: development of treatment plan with patient and/or surrogate as well as nursing, discussions with consultants, evaluation of patient's response to treatment, examination of patient, obtaining history from patient or surrogate, ordering and performing treatments and interventions, ordering and review of laboratory studies, ordering and review of radiographic studies, pulse oximetry and re-evaluation of patient's condition.    Shyana Kulakowski, Delice Bison, DO 03/02/20 0236

## 2020-03-02 NOTE — ED Notes (Signed)
Cardiac provider bedside 

## 2020-03-02 NOTE — Progress Notes (Signed)
ANTICOAGULATION CONSULT NOTE - Initial Consult  Pharmacy Consult for Heparin Indication: chest pain/ACS  Patient Measurements: Height: 5\' 2"  (157.5 cm) Weight: 135.5 kg (298 lb 12.8 oz) IBW/kg (Calculated) : 50.1 Heparin Dosing Weight: 84.5 kg  Vital Signs: Temp: 98.1 F (36.7 C) (04/10 0817) Temp Source: Oral (04/10 0817) BP: 118/71 (04/10 0817) Pulse Rate: 91 (04/10 0817)  Labs: Recent Labs    03/01/20 1832 03/01/20 2020 03/01/20 2323 03/02/20 0355 03/02/20 1117  HGB 14.3  --   --   --   --   HCT 44.0  --   --   --   --   PLT 294  --   --   --   --   APTT  --   --  29  --   --   LABPROT  --   --  13.1  --   --   INR  --   --  1.0  --   --   HEPARINUNFRC  --   --  <0.10*  --  0.81*  CREATININE 1.28*  --   --  1.22*  --   TROPONINIHS 2,225* 3,272*  --  10,478*  --     Estimated Creatinine Clearance: 62 mL/min (A) (by C-G formula based on SCr of 1.22 mg/dL (H)).   Assessment: [name] started on heparin for ACS. Rivaroxaban noted on prior med rec, but per current med rec patient is not taking d/t money/insurance issues.    First HL drawn late and slightly high. HL was drawn from new Midline and heparin is running in PIV on other arm. No issues with infusion other than sometimes patient bends her arm and occludes the heparin infusion line. No bleeding per RN.   Goal of Therapy:  Heparin level 0.3-0.7 units/ml Monitor platelets by anticoagulation protocol: Yes   Plan:  Decrease heparin to 1250 units/hr Monitor daily HL, CBC/plt Monitor for signs/symptoms of bleeding     Benetta Spar, PharmD, BCPS, BCCP Clinical Pharmacist  Please check AMION for all [facility] phone numbers After 10:00 PM, call [place]

## 2020-03-02 NOTE — CV Procedure (Signed)
2D echo attempted, but patient was eating. Will try later

## 2020-03-02 NOTE — Progress Notes (Signed)
  Echocardiogram 2D Echocardiogram has been attempted. Patient receiving IV consult. Will return at later time.  Rhonda Roberts 03/02/2020, 8:51 AM

## 2020-03-02 NOTE — Progress Notes (Signed)
  Echocardiogram 2D Echocardiogram has been performed.  Rhonda Roberts 03/02/2020, 3:46 PM

## 2020-03-02 NOTE — H&P (Addendum)
Cardiology History & Physical    Patient ID: ORIELLE COCKEY MRN: [id number], DOB/AGE: 1956/04/07   Admit date: 03/01/2020  Primary Physician: Suella Broad, FNP Primary Cardiologist: No primary care provider on file.  Patient Profile    Rhonda Roberts is a 64 y.o. woman with a history of type 2 DM, HTN, dyslipidemia, morbid obesity, GERD, and arthritis who presented with intermittent chest pain in the setting of nausea, vomiting, and diarrhea for the past week.   History of Present Illness    Rhonda Roberts reports first having nausea and abdominal discomfort starting Sunday (6 days ago) then began having diarrhea as well, all of which persisted for most of the week to the point that she "couldn't keep anything down" around Tuesday-Wednesday. She cares for her brother who actually felt unwell with associated diarrhea the day before her symptoms began, but his symptoms resolved within a day or two. On Wednesday she began having intermittent episodes of pressure in the center of her chest that have lasted about an hour. Her GI symptoms have improves somewhat but the episodes of chest discomfort have persisted, prompting her to come to the ED today. She also endorses exertional dyspnea during the course of her illness as well. Initially, she did have a mostly dry cough, but this has resolved and was never severe. She does not have any difficulty swallowing, just with nausea. The diarrhea is non-bloody, but she did note that it became dark mid-week, then greenish yesterday. Her stool hemoccult was negative for blood and she has tested negative for COVID and influenza.   Work-up of her chest pain in the ED revealed an initial troponin of 2225, increasing to 3272 on repeat. Initial ECG was notable for ischemic changes with ST depression in inferior and anterolateral leads and < 76mm ST elevation in aVR and V1. CTA C/A/P negative for any acute pathology. Notably, blood pressure on presentation  was severely elevated, though accuracy of these readings is unclear given marked variability in pressures (eg 202/112, 137/112, 198/168). Due to these findings, she was started on heparin and nitro infusions and transferred from [facility] to [facility] ED for cardiology evaluation. She arrived here on nitro at 60 mcg/min with minimal chest pain and normotensive, her chest pain subsequently resolving completely. She is overall sedentary with her only activity being ADLs/IADLs - she does get short of breath and fatigued very quickly with chores such as cleaning the bathtub, but no chest pain.   She states that after retiring a couple of years ago, she lost her insurance and was unable to fill her antihypertensives. She does not check her blood pressure at home. She also has only been able to afford Novolin, and takes 80 units BID with blood glucose ranging from 100-300s at home.   Past Medical History   Past Medical History:  Diagnosis Date  . Diabetes mellitus without complication ([place])   . GERD (gastroesophageal reflux disease)   . Headache   . Hypertension     Past Surgical History:  Procedure Laterality Date  . ABDOMINAL HYSTERECTOMY    . CESAREAN SECTION  1987  . COLONOSCOPY WITH PROPOFOL N/A 05/08/2016   Procedure: COLONOSCOPY WITH PROPOFOL;  Surgeon: Carol Ada, MD;  Location: [facility] ENDOSCOPY;  Service: Endoscopy;  Laterality: N/A;     Allergies Allergies  Allergen Reactions  . Sulfa Antibiotics Itching, Swelling and Rash    Home Medications    Prior to Admission medications   Medication Sig Start Date End  Date Taking? Authorizing Provider  aspirin 81 MG chewable tablet Chew 81 mg by mouth daily.   Yes [provider]  Cholecalciferol (VITAMIN D) 2000 UNITS CAPS Take 2,000 Units by mouth every morning.    Yes [provider]  Cyanocobalamin (VITAMIN B 12 PO) Take 2 tablets by mouth daily.    Yes [provider]  insulin NPH Human (NOVOLIN N) 100 UNIT/ML injection  Inject 80 Units into the skin daily. May do additional 80 units if evening blood sugar over 150   Yes [provider]  Multiple Vitamin (MULTIVITAMIN WITH MINERALS) TABS Take 1 tablet by mouth every morning.   Yes [provider]    Family History    Multiple family members with diabetes She does not believe there is any family history of cardiovascular disease that she is aware of.  Social History    Socioeconomic History  . Marital status: Single  Occupational History  . Occupation: retired  Tobacco Use  . Smoking status: Former Smoker    Quit date: 11/23/2004    Years since quitting: 15.2  . Smokeless tobacco: Never Used  Substance and Sexual Activity  . Alcohol use: No  . Drug use: No   Social Determinants of Health   Financial Resource Strain:   . Difficulty of Paying Living Expenses: Unable to afford medications  Physical Activity:   . Days of Exercise per Week: 0  Stress:   . Feeling of Stress : caring for brother     Review of Systems    A comprehensive review of 10 systems was performed with pertinent positives and negatives noted in the HPI.  Physical Exam    BP 111/74   Pulse 97   Temp 98.3 F (36.8 C) (Oral)   Resp (!) 25   Ht 5\' 2"  (1.575 m)   Wt 130.2 kg   SpO2 96%   BMI 52.49 kg/m  General: MORBIDLY obese. Alert, calm, and in no distress. Daughter is at bedside.  HEENT: Grossly normal.  Neck: JVP is mildly elevated, probably 6-7cm. No bruits. Lungs:  Resp regular and unlabored, CTA bilaterally. Heart: Regular rhythm, very distant heart sounds, 2/6 systolic murmur, No [id number]. POCUS is limited by poorly functioning ultrasound, though LV function is at least near normal and RV size and function appears to be normal.  Abdomen: Soft, non-tender, non-distended, BS +.  Extremities: Warm. No clubbing, cyanosis or edema. DP/PT/Radials 2+ and equal bilaterally. Psych: Normal affect and thought process. Neuro: Alert and oriented. No gross  focal deficits. No abnormal movements.  Labs    Cardiac Panel (last 3 results) Recent Labs    03/01/20 1832 03/01/20 2020  TROPONINIHS 2,225* 3,272*   Lab Results  Component Value Date   WBC 11.1 (H) 03/01/2020   HGB 14.3 03/01/2020   HCT 44.0 03/01/2020   MCV 88.2 03/01/2020   PLT 294 03/01/2020    Recent Labs  Lab 03/01/20 1832  NA 135  K 3.9  CL 98  CO2 27  BUN 23  CREATININE 1.28*  CALCIUM 9.2  PROT 9.0*  BILITOT 0.6  ALKPHOS 113  ALT 24  AST 36  GLUCOSE 286*     Radiology Studies    DG Chest 2 View  Result Date: 03/01/2020 CLINICAL DATA:  Chest pain EXAM: CHEST - 2 VIEW COMPARISON:  12/18/2016 FINDINGS: Peribronchial thickening and interstitial prominence. Heart is normal size. No effusions or acute bony abnormality. IMPRESSION: Bronchitic changes. Electronically Signed   By: Lennette Bihari  [place] M.D.   On: 03/01/2020 18:58   CT Angio Chest/Abd/Pel for Dissection W and/or Wo Contrast  Result Date: 03/01/2020 CLINICAL DATA:  64 year old female with chest and back pain. Concern for aortic dissection. EXAM: CT ANGIOGRAPHY CHEST, ABDOMEN AND PELVIS TECHNIQUE: Non-contrast CT of the chest was initially obtained. Multidetector CT imaging through the chest, abdomen and pelvis was performed using the standard protocol during bolus administration of intravenous contrast. Multiplanar reconstructed images and MIPs were obtained and reviewed to evaluate the vascular anatomy. CONTRAST:  154mL OMNIPAQUE IOHEXOL 350 MG/ML SOLN COMPARISON:  CT abdomen pelvis dated 02/28/2015. FINDINGS: Evaluation of this exam is limited due to respiratory motion artifact. CTA CHEST FINDINGS Cardiovascular: There is no cardiomegaly or pericardial effusion. The thoracic aorta is unremarkable. The origins of the great vessels of the aortic arch appear patent as visualized. The central pulmonary arteries are patent. Mediastinum/Nodes: There is no hilar or mediastinal adenopathy. The esophagus and the thyroid  gland are grossly unremarkable as visualized. No mediastinal fluid collection. Lungs/Pleura: The lungs are clear. There is no pleural effusion or pneumothorax. The central airways are patent. Musculoskeletal: Degenerative changes of the spine. No acute osseous pathology. Review of the MIP images confirms the above findings. CTA ABDOMEN AND PELVIS FINDINGS VASCULAR Aorta: Normal caliber aorta without aneurysm, dissection, vasculitis or significant stenosis. Celiac: Patent without evidence of aneurysm, dissection, vasculitis or significant stenosis. SMA: Patent without evidence of aneurysm, dissection, vasculitis or significant stenosis. Renals: Both renal arteries are patent without evidence of aneurysm, dissection, vasculitis, fibromuscular dysplasia or significant stenosis. IMA: Patent without evidence of aneurysm, dissection, vasculitis or significant stenosis. Inflow: Mild atherosclerotic calcification of the iliac arteries. No aneurysmal dilatation or dissection. The iliac arteries are patent. Veins: No obvious venous abnormality within the limitations of this arterial phase study. Review of the MIP images confirms the above findings. NON-VASCULAR Hepatobiliary: The liver is unremarkable. No intrahepatic biliary ductal dilatation. The gallbladder is unremarkable. Pancreas: Unremarkable. No pancreatic ductal dilatation or surrounding inflammatory changes. Spleen: Normal in size without focal abnormality. Adrenals/Urinary Tract: The adrenal glands are unremarkable. The kidneys, and the visualized ureters appear unremarkable. The urinary bladder is partially distended. There is a focus of air within the bladder which may be related to recent instrumentation or secondary to an infectious process. Clinical correlation is recommended. Stomach/Bowel: There is sigmoid diverticulosis and scattered colonic diverticula without active inflammatory changes. There is no bowel obstruction or active inflammation. The appendix  is normal. Lymphatic: No adenopathy. Reproductive: Hysterectomy. No adnexal masses. Other: None Musculoskeletal: Degenerative changes of the spine with multilevel disc desiccation and vacuum phenomena. No acute osseous pathology. Review of the MIP images confirms the above findings. IMPRESSION: 1. No acute intrathoracic, abdominal, or pelvic pathology. No aortic aneurysm or dissection. 2. Colonic diverticulosis.  No bowel obstruction. Normal appendix. 3. A focus of air within the bladder may be related to recent instrumentation or secondary to an infectious process. Clinical correlation is recommended. Electronically Signed   By: Anner Crete M.D.   On: 03/01/2020 21:07    ECG & Cardiac Imaging    ECG #1: Sinus tachycardia, normal axis, ST depression in inferior/anterolateral leads, <30mm ST elevation in V1 and aVR, LAE, possible RAE, PRWP potentially due to obesity/lead placement - personally reviewed.  ECG #1: Heart rate has decreased, now NSR, and ST changes resolving, otherwise unchanged from prior.  Assessment & Plan    NSTEMI:  Atypical presentation, though numerous risk factors, ischemic ECG changes, and troponin has now trended up  to >10,000. ACS possibly precipitated by resolving GI illness. Myocarditis or stress CM is less likely with this troponin trend. Now chest pain free. - Will need cath at this point, she is NPO  - Continue to trend troponin - TTE in the morning  - Lipid panel and A1c - Start atorvastatin 40mg  daily (high risk ASCVD + DM) - Continue aspirin, full dose given PTA - Continue heparin ggt - Stop nitroglycerin infusion and monitor for recurrent chest pain. Will restart if needed. - Beta blocker/blood pressure control as outlined below - Monitor on telemetry   Hypertension: - Transition off nitro infusion to oral therapy - Start carvedilol 6.25mg  BID - Start enalapril 5mg  BID  Suspect acute gastroenteritis: seems to be improving - CT abdomen/pelvis benign -  Viral studies negative, no specific indication for further work-up - Will hold off on any IV fluids and encourage oral hydration in the setting of severe hypertension   Type 2 DM with hyperglycemia: only insulin only at home, 160 units total daily dose - Check A1c - Lantus 40 units nightly - Lispro 10 units TID AC + SSI - Needs to re-establish care with PCP   Nutrition: Keep NPO for now, pending clinical course DVT ppx: therapeutic heparin GI ppx: home PPI Advanced Care Planning: Full code Dispo: admit to cardiology   Signed, Rhonda Lex, MD 03/02/2020, 3:20 AM    Attending addendum: History and all data above reviewed.  Patient examined.  I agree with the findings as above. All available labs, radiology testing, previous records reviewed. Agree with documented assessment and plan. Rhonda Roberts is a 70F with diabetes, hypertension, hyperlipidemia, morbid obesity, and GERD admitted with NSTEMI.  Presenting symptoms were chest pain, nausea, vomiting, and diarrhea.  High-sensitivity troponin continues to climb, most recently to 10,000.  She is currently without chest pain or pressure.  Echocardiogram is pending.  We will plan for left heart catheterization on Monday.  Continue aspirin, carvedilol, heparin.  We will switch enalapril to losartan.  Consider Entresto if LVEF is reduced.  Increase atorvastatin to 80 mg.  It does not appear that she was on a statin prior to admission.  She will need repeat lipids and a CMP in 6 to 8 weeks.  Risks and benefits of cardiac catheterization have been discussed with the patient.  The patient understands that risks included but are not limited to stroke (1 in 1000), death (1 in 82), kidney failure [usually temporary] (1 in 500), bleeding (1 in 200), allergic reaction [possibly serious] (1 in 200). The patient understands and agrees to proceed.    Nena Hampe C. Oval Linsey, MD, [facility]  03/02/2020 1:36 PM

## 2020-03-02 NOTE — Progress Notes (Signed)
ANTICOAGULATION CONSULT NOTE - Initial Consult  Pharmacy Consult for Heparin Indication: chest pain/ACS  Allergies  Allergen Reactions  . Sulfa Antibiotics Itching, Swelling and Rash    Patient Measurements: Height: 5\' 2"  (157.5 cm) Weight: 130.2 kg (287 lb) IBW/kg (Calculated) : 50.1 Heparin Dosing Weight:   Vital Signs: Temp: 98.1 F (36.7 C) (04/09 1731) Temp Source: Oral (04/09 1731) BP: 149/110 (04/09 2215) Pulse Rate: 86 (04/09 2215)  Labs: Recent Labs    03/01/20 1832 03/01/20 2020 03/01/20 2323  HGB 14.3  --   --   HCT 44.0  --   --   PLT 294  --   --   APTT  --   --  29  LABPROT  --   --  13.1  INR  --   --  1.0  HEPARINUNFRC  --   --  <0.10*  CREATININE 1.28*  --   --   TROPONINIHS 2,225* 3,272*  --     Estimated Creatinine Clearance: 57.5 mL/min (A) (by C-G formula based on SCr of 1.28 mg/dL (H)).   Medical History: Past Medical History:  Diagnosis Date  . Diabetes mellitus without complication ([place])   . GERD (gastroesophageal reflux disease)   . Headache   . Hypertension     Medications:  Infusions:  . heparin    . nitroGLYCERIN 25 mcg/min (03/01/20 2331)    Assessment: Patient with + CE.  MD directs pharmacy to dose heparin for ACS.  Rivaroxaban noted on prior med rec, but per current med rec patient is not taking d/t money/insurance issues.  Baseline coags ordered and WNL.    Goal of Therapy:  Heparin level 0.3-0.7 units/ml Monitor platelets by anticoagulation protocol: Yes   Plan:  Heparin bolus 4000 units iv x1 Heparin drip at 1400 units/hr Daily CBC Next heparin level at 0900    Tyler Deis, Shea Stakes [place] 03/02/2020,12:06 AM

## 2020-03-02 NOTE — ED Notes (Signed)
Paged cardio Dr Kalman Shan to Dr Leonides Schanz

## 2020-03-03 DIAGNOSIS — Z6841 Body Mass Index (BMI) 40.0 and over, adult: Secondary | ICD-10-CM

## 2020-03-03 DIAGNOSIS — K529 Noninfective gastroenteritis and colitis, unspecified: Secondary | ICD-10-CM

## 2020-03-03 DIAGNOSIS — E7800 Pure hypercholesterolemia, unspecified: Secondary | ICD-10-CM

## 2020-03-03 LAB — CBC
HCT: 36.2 % (ref 36.0–46.0)
Hemoglobin: 11.7 g/dL — ABNORMAL LOW (ref 12.0–15.0)
MCH: 28.5 pg (ref 26.0–34.0)
MCHC: 32.3 g/dL (ref 30.0–36.0)
MCV: 88.3 fL (ref 80.0–100.0)
Platelets: 271 10*3/uL (ref 150–400)
RBC: 4.1 MIL/uL (ref 3.87–5.11)
RDW: 15 % (ref 11.5–15.5)
WBC: 11.5 10*3/uL — ABNORMAL HIGH (ref 4.0–10.5)
nRBC: 0 % (ref 0.0–0.2)

## 2020-03-03 LAB — GLUCOSE, CAPILLARY
Glucose-Capillary: 156 mg/dL — ABNORMAL HIGH (ref 70–99)
Glucose-Capillary: 226 mg/dL — ABNORMAL HIGH (ref 70–99)
Glucose-Capillary: 177 mg/dL — ABNORMAL HIGH (ref 70–99)
Glucose-Capillary: 171 mg/dL — ABNORMAL HIGH (ref 70–99)

## 2020-03-03 LAB — HEMOGLOBIN AND HEMATOCRIT, BLOOD
HCT: 36.8 % (ref 36.0–46.0)
Hemoglobin: 11.9 g/dL — ABNORMAL LOW (ref 12.0–15.0)

## 2020-03-03 LAB — TROPONIN I (HIGH SENSITIVITY): Troponin I (High Sensitivity): 8166 ng/L (ref <18)

## 2020-03-03 LAB — HEPARIN LEVEL (UNFRACTIONATED): Heparin Unfractionated: 0.46 IU/mL (ref 0.30–0.70)

## 2020-03-03 MED ORDER — PANTOPRAZOLE SODIUM 40 MG IV SOLR
40.0000 mg | Freq: Two times a day (BID) | INTRAVENOUS | Status: DC
  Administered 2020-03-03 – 2020-03-04 (×3): 40 mg via INTRAVENOUS
  Filled 2020-03-03 (×3): qty 40

## 2020-03-03 MED ORDER — LORATADINE 10 MG PO TABS
10.0000 mg | ORAL_TABLET | Freq: Every day | ORAL | Status: DC
  Administered 2020-03-03 – 2020-03-05 (×2): 10 mg via ORAL
  Filled 2020-03-03 (×3): qty 1

## 2020-03-03 NOTE — Progress Notes (Signed)
Progress Note  Patient Name: Rhonda Roberts Date of Encounter: 03/03/2020  Primary Cardiologist: No primary care provider on file.   Subjective   Feeling.  This morning she is nauseous.  Zofran did help some.  She also noted that her stools are becoming dark again.  She is no longer having diarrhea.  Inpatient Medications    Scheduled Meds: . aspirin  81 mg Oral Daily  . atorvastatin  80 mg Oral q1800  . carvedilol  6.25 mg Oral BID WC  . insulin aspart  0-20 Units Subcutaneous TID WC  . insulin aspart  0-5 Units Subcutaneous QHS  . insulin aspart  10 Units Subcutaneous TID WC  . insulin NPH Human  40 Units Subcutaneous Q12H  . loratadine  10 mg Oral Daily  . losartan  25 mg Oral Daily  . metFORMIN  500 mg Oral BID WC   Continuous Infusions: . heparin 1,250 Units/hr (03/02/20 1449)   PRN Meds: acetaminophen, metoprolol tartrate, nitroGLYCERIN, ondansetron (ZOFRAN) IV, sodium chloride flush   Vital Signs    Vitals:   03/02/20 2103 03/02/20 2149 03/03/20 0541 03/03/20 0930  BP: 90/69 133/90 (!) 135/92 (!) 135/91  Pulse: 87  86 87  Resp: 19  19   Temp: 98.7 F (37.1 C)  98 F (36.7 C)   TempSrc: Oral  Oral   SpO2: 98%  98%   Weight:   136 kg   Height:        Intake/Output Summary (Last 24 hours) at 03/03/2020 1045 Last data filed at 03/02/2020 1945 Gross per 24 hour  Intake 735.03 ml  Output 450 ml  Net 285.03 ml   Last 3 Weights 03/03/2020 03/02/2020 03/01/2020  Weight (lbs) 299 lb 14.4 oz 298 lb 12.8 oz 287 lb  Weight (kg) 136.034 kg 135.535 kg 130.182 kg      Telemetry    Sinus rhythm.  PVCs.- Personally Reviewed  ECG    03/03/2020: Sinus rhythm.  Rate 87 bpm.  Lateral T wave abnormalities. - Personally Reviewed  Physical Exam   VS:  BP (!) 135/91   Pulse 87   Temp 98 F (36.7 C) (Oral)   Resp 19   Ht 5\' 2"  (1.575 m)   Wt 136 kg   SpO2 98%   BMI 54.85 kg/m  , BMI Body mass index is 54.85 kg/m. GENERAL: Feels poorly.  Mildly  diaphoretic HEENT: Pupils equal round and reactive, fundi not visualized, oral mucosa unremarkable NECK:  No jugular venous distention, waveform within normal limits, carotid upstroke brisk and symmetric, no bruits LUNGS:  Clear to auscultation bilaterally HEART:  RRR.  PMI not displaced or sustained,S1 and S2 within normal limits, no S3, no S4, no clicks, no rubs, no murmurs ABD:  Flat, positive bowel sounds normal in frequency in pitch, no bruits, no rebound, no guarding, no midline pulsatile mass, no hepatomegaly, no splenomegaly EXT:  2 plus pulses throughout, 1+ lower extremity edema to the lower tibia bilaterally, no cyanosis no clubbing SKIN:  No rashes no nodules NEURO:  Cranial nerves II through XII grossly intact, motor grossly intact throughout PSYCH:  Cognitively intact, oriented to person place and time   Labs    High Sensitivity Troponin:   Recent Labs  Lab 03/01/20 1832 03/01/20 2020 03/02/20 0355  TROPONINIHS 2,225* 3,272* 10,478*      Chemistry Recent Labs  Lab 03/01/20 1832 03/02/20 0355  NA 135 137  K 3.9 3.7  CL 98 101  CO2 27  23  GLUCOSE 286* 244*  BUN 23 21  CREATININE 1.28* 1.22*  CALCIUM 9.2 8.7*  PROT 9.0*  --   ALBUMIN 3.8  --   AST 36  --   ALT 24  --   ALKPHOS 113  --   BILITOT 0.6  --   GFRNONAA 44* 47*  GFRAA 51* 54*  ANIONGAP 10 13     Hematology Recent Labs  Lab 03/01/20 1832 03/03/20 0557  WBC 11.1* 11.5*  RBC 4.99 4.10  HGB 14.3 11.7*  HCT 44.0 36.2  MCV 88.2 88.3  MCH 28.7 28.5  MCHC 32.5 32.3  RDW 14.6 15.0  PLT 294 271    BNPNo results for input(s): BNP, PROBNP in the last 168 hours.   DDimer No results for input(s): DDIMER in the last 168 hours.   Radiology    DG Chest 2 View  Result Date: 03/01/2020 CLINICAL DATA:  Chest pain EXAM: CHEST - 2 VIEW COMPARISON:  12/18/2016 FINDINGS: Peribronchial thickening and interstitial prominence. Heart is normal size. No effusions or acute bony abnormality. IMPRESSION:  Bronchitic changes. Electronically Signed   By: Rolm Baptise M.D.   On: 03/01/2020 18:58   ECHOCARDIOGRAM COMPLETE  Result Date: 03/02/2020    ECHOCARDIOGRAM REPORT   Patient Name:   Rhonda Roberts Whidden Date of Exam: 03/02/2020 Medical Rec #:  [id number]         Height:       62.0 in Accession #:    [id number]        Weight:       298.8 lb Date of Birth:  03-19-1956         BSA:          2.267 m Patient Age:    60 years          BP:           136/94 mmHg Patient Gender: F                 HR:           88 bpm. Exam Location:  Inpatient Procedure: 2D Echo, Cardiac Doppler, Color Doppler and Intracardiac            Opacification Agent Indications:     R07.9* Chest pain, unspecified  History:         Patient has no prior history of Echocardiogram examinations.                  Signs/Symptoms:Chest Pain; Risk Factors:Diabetes, Dyslipidemia                  and Hypertension. Elevated troponin.  Sonographer:     Roseanna Rainbow RDCS Referring Phys:  [id number] SCOTT W ROSE Diagnosing Phys: Mertie Moores MD  Sonographer Comments: Technically difficult study due to poor echo windows and patient is morbidly obese. Image acquisition challenging due to patient body habitus. IMPRESSIONS  1. Left ventricular ejection fraction, by estimation, is 40 to 45%. The left ventricle has mildly decreased function. The left ventricle has no regional wall motion abnormalities. There is mild concentric left ventricular hypertrophy. Left ventricular diastolic parameters are consistent with Grade II diastolic dysfunction (pseudonormalization). There is severe akinesis of the left ventricular, apical anteroseptal wall and lateral wall.  2. Right ventricular systolic function is mildly reduced. The right ventricular size is mildly enlarged. There is normal pulmonary artery systolic pressure.  3. The mitral valve is normal in structure. Trivial mitral valve regurgitation. No evidence of  mitral stenosis.  4. The aortic valve is normal in structure. Aortic  valve regurgitation is not visualized. No aortic stenosis is present. FINDINGS  Left Ventricle: Left ventricular ejection fraction, by estimation, is 40 to 45%. The left ventricle has mildly decreased function. The left ventricle has no regional wall motion abnormalities. Severe akinesis of the left ventricular, apical anteroseptal  wall and lateral wall. Definity contrast agent was given IV to delineate the left ventricular endocardial borders. The left ventricular internal cavity size was normal in size. There is mild concentric left ventricular hypertrophy. Left ventricular diastolic parameters are consistent with Grade II diastolic dysfunction (pseudonormalization). Right Ventricle: The right ventricular size is mildly enlarged. No increase in right ventricular wall thickness. Right ventricular systolic function is mildly reduced. There is normal pulmonary artery systolic pressure. The tricuspid regurgitant velocity  is 2.10 m/s, and with an assumed right atrial pressure of 3 mmHg, the estimated right ventricular systolic pressure is 123XX123 mmHg. Left Atrium: Left atrial size was normal in size. Right Atrium: Right atrial size was normal in size. Pericardium: There is no evidence of pericardial effusion. Mitral Valve: The mitral valve is normal in structure. Trivial mitral valve regurgitation. No evidence of mitral valve stenosis. Tricuspid Valve: The tricuspid valve is grossly normal. Tricuspid valve regurgitation is not demonstrated. No evidence of tricuspid stenosis. Aortic Valve: The aortic valve is normal in structure. Aortic valve regurgitation is not visualized. No aortic stenosis is present. Pulmonic Valve: The pulmonic valve was grossly normal. Pulmonic valve regurgitation is trivial. No evidence of pulmonic stenosis. Aorta: The aortic root and ascending aorta are structurally normal, with no evidence of dilitation. IAS/Shunts: The atrial septum is grossly normal.  LEFT VENTRICLE PLAX 2D LVIDd:          4.10 cm     Diastology LVIDs:         2.80 cm     LV e' lateral:   7.94 cm/s LV PW:         1.30 cm     LV E/e' lateral: 16.2 LV IVS:        1.30 cm     LV e' medial:    6.09 cm/s LVOT diam:     1.70 cm     LV E/e' medial:  21.2 LV SV:         67 LV SV Index:   30 LVOT Area:     2.27 cm  LV Volumes (MOD) LV vol d, MOD A2C: 69.0 ml LV vol d, MOD A4C: 89.2 ml LV vol s, MOD A2C: 45.9 ml LV vol s, MOD A4C: 46.6 ml LV SV MOD A2C:     23.1 ml LV SV MOD A4C:     89.2 ml LV SV MOD BP:      31.9 ml RIGHT VENTRICLE             IVC RV S prime:     10.80 cm/s  IVC diam: 2.00 cm TAPSE (M-mode): 1.7 cm LEFT ATRIUM             Index       RIGHT ATRIUM           Index LA diam:        3.40 cm 1.50 cm/m  RA Area:     10.00 cm LA Vol (A2C):   19.1 ml 8.42 ml/m  RA Volume:   17.80 ml  7.85 ml/m LA Vol (A4C):   32.3 ml 14.25 ml/m LA Biplane  Vol: 25.0 ml 11.03 ml/m  AORTIC VALVE LVOT Vmax:   159.00 cm/s LVOT Vmean:  115.000 cm/s LVOT VTI:    0.297 m  AORTA Ao Root diam: 2.70 cm Ao Asc diam:  3.00 cm MITRAL VALVE                TRICUSPID VALVE MV Area (PHT): 3.99 cm     TR Peak grad:   17.6 mmHg MV Decel Time: 190 msec     TR Vmax:        210.00 cm/s MV E velocity: 129.00 cm/s MV A velocity: 140.00 cm/s  SHUNTS MV E/A ratio:  0.92         Systemic VTI:  0.30 m                             Systemic Diam: 1.70 cm Mertie Moores MD Electronically signed by Mertie Moores MD Signature Date/Time: 03/02/2020/4:18:42 PM    Final (Updated)    CT Angio Chest/Abd/Pel for Dissection W and/or Wo Contrast  Result Date: 03/01/2020 CLINICAL DATA:  64 year old female with chest and back pain. Concern for aortic dissection. EXAM: CT ANGIOGRAPHY CHEST, ABDOMEN AND PELVIS TECHNIQUE: Non-contrast CT of the chest was initially obtained. Multidetector CT imaging through the chest, abdomen and pelvis was performed using the standard protocol during bolus administration of intravenous contrast. Multiplanar reconstructed images and MIPs were obtained and  reviewed to evaluate the vascular anatomy. CONTRAST:  179mL OMNIPAQUE IOHEXOL 350 MG/ML SOLN COMPARISON:  CT abdomen pelvis dated 02/28/2015. FINDINGS: Evaluation of this exam is limited due to respiratory motion artifact. CTA CHEST FINDINGS Cardiovascular: There is no cardiomegaly or pericardial effusion. The thoracic aorta is unremarkable. The origins of the great vessels of the aortic arch appear patent as visualized. The central pulmonary arteries are patent. Mediastinum/Nodes: There is no hilar or mediastinal adenopathy. The esophagus and the thyroid gland are grossly unremarkable as visualized. No mediastinal fluid collection. Lungs/Pleura: The lungs are clear. There is no pleural effusion or pneumothorax. The central airways are patent. Musculoskeletal: Degenerative changes of the spine. No acute osseous pathology. Review of the MIP images confirms the above findings. CTA ABDOMEN AND PELVIS FINDINGS VASCULAR Aorta: Normal caliber aorta without aneurysm, dissection, vasculitis or significant stenosis. Celiac: Patent without evidence of aneurysm, dissection, vasculitis or significant stenosis. SMA: Patent without evidence of aneurysm, dissection, vasculitis or significant stenosis. Renals: Both renal arteries are patent without evidence of aneurysm, dissection, vasculitis, fibromuscular dysplasia or significant stenosis. IMA: Patent without evidence of aneurysm, dissection, vasculitis or significant stenosis. Inflow: Mild atherosclerotic calcification of the iliac arteries. No aneurysmal dilatation or dissection. The iliac arteries are patent. Veins: No obvious venous abnormality within the limitations of this arterial phase study. Review of the MIP images confirms the above findings. NON-VASCULAR Hepatobiliary: The liver is unremarkable. No intrahepatic biliary ductal dilatation. The gallbladder is unremarkable. Pancreas: Unremarkable. No pancreatic ductal dilatation or surrounding inflammatory changes.  Spleen: Normal in size without focal abnormality. Adrenals/Urinary Tract: The adrenal glands are unremarkable. The kidneys, and the visualized ureters appear unremarkable. The urinary bladder is partially distended. There is a focus of air within the bladder which may be related to recent instrumentation or secondary to an infectious process. Clinical correlation is recommended. Stomach/Bowel: There is sigmoid diverticulosis and scattered colonic diverticula without active inflammatory changes. There is no bowel obstruction or active inflammation. The appendix is normal. Lymphatic: No adenopathy. Reproductive: Hysterectomy. No adnexal masses. Other: None  Musculoskeletal: Degenerative changes of the spine with multilevel disc desiccation and vacuum phenomena. No acute osseous pathology. Review of the MIP images confirms the above findings. IMPRESSION: 1. No acute intrathoracic, abdominal, or pelvic pathology. No aortic aneurysm or dissection. 2. Colonic diverticulosis.  No bowel obstruction. Normal appendix. 3. A focus of air within the bladder may be related to recent instrumentation or secondary to an infectious process. Clinical correlation is recommended. Electronically Signed   By: Anner Crete M.D.   On: 03/01/2020 21:07    Cardiac Studies   Echo 03/02/2020: IMPRESSIONS    1. Left ventricular ejection fraction, by estimation, is 40 to 45%. The  left ventricle has mildly decreased function. The left ventricle has no  regional wall motion abnormalities. There is mild concentric left  ventricular hypertrophy. Left ventricular  diastolic parameters are consistent with Grade II diastolic dysfunction  (pseudonormalization). There is severe akinesis of the left ventricular,  apical anteroseptal wall and lateral wall.  2. Right ventricular systolic function is mildly reduced. The right  ventricular size is mildly enlarged. There is normal pulmonary artery  systolic pressure.  3. The mitral  valve is normal in structure. Trivial mitral valve  regurgitation. No evidence of mitral stenosis.  4. The aortic valve is normal in structure. Aortic valve regurgitation is  not visualized. No aortic stenosis is present.   Patient Profile     Ms. Suddarth is a 87F with diabetes, hypertension, hyperlipidemia, morbid obesity, and GERD admitted with NSTEMI.  Assessment & Plan    # NSTEMI: High-sensitivity troponin has continued to rise to 10,000.  We will repeat lab today as there was a big increase from the 2nd-3rd assessment yesterday.  LDL was 159 on admission.  Continue aspirin and heparin for now.  Concern for GI bleed as below.  Continue aspirin, carvedilol, heparin.  Tentatively planning for cath tomorrow.    This has been discontinued.  Echo revealed LVEF 40 to 45% with focal wall motion abnormalities.  # Melena:  Patient reports dark stools.  Hemoglobin has dropped from 14-11.7.  We will repeat H/H.  Start pantoprazole 40 mg IV twice daily.  We will also guaiac her stools.  If she is having GI bleeding this need to be addressed prior to cath.   # Essential hypertension: Started on carvedilol and enalapril this admission.   Enalapril was switched to losartan.  Given her reduced EF will plan to transition to [facility] after cath.  #Acute systolic diastolic heart failure: Ischemic evaluation is needed.  Pending follow-up values for her H/H as above.  She has some mild lower extremity edema.  # Air in bladder:  Noted on CT chest/abd/pelvis on 4/9.  Urinalysis was negative for infection.  # Suspected acute gastroenteritis:  Patient nauseous again.  Viral studies negative.  # DM:  Needs to establish with PCP as an outpatient.  Add metformin at discharged.  Given her reduced ejection fraction she would be a good candidate for an SGLT2 inhibitor.  For questions or updates, please contact [place] Please consult [email or web address] for contact info under        Signed, Skeet Latch, MD  03/03/2020, 10:45 AM

## 2020-03-03 NOTE — Progress Notes (Addendum)
ANTICOAGULATION CONSULT NOTE - Initial Consult  Pharmacy Consult for Heparin Indication: chest pain/ACS  Patient Measurements: Height: 5\' 2"  (157.5 cm) Weight: 136 kg (299 lb 14.4 oz) IBW/kg (Calculated) : 50.1 Heparin Dosing Weight: 84.5 kg  Vital Signs: Temp: 98 F (36.7 C) (04/11 0541) Temp Source: Oral (04/11 0541) BP: 135/91 (04/11 0930) Pulse Rate: 87 (04/11 0930)  Labs: Recent Labs    03/01/20 1832 03/01/20 2020 03/01/20 2323 03/02/20 0355 03/02/20 1117 03/03/20 0557 03/03/20 0606  HGB 14.3  --   --   --   --  11.7*  --   HCT 44.0  --   --   --   --  36.2  --   PLT 294  --   --   --   --  271  --   APTT  --   --  29  --   --   --   --   LABPROT  --   --  13.1  --   --   --   --   INR  --   --  1.0  --   --   --   --   HEPARINUNFRC  --   --  <0.10*  --  0.81*  --  0.46  CREATININE 1.28*  --   --  1.22*  --   --   --   TROPONINIHS 2,225* 3,272*  --  10,478*  --   --   --     Estimated Creatinine Clearance: 62.1 mL/min (A) (by C-G formula based on SCr of 1.22 mg/dL (H)).   Assessment: [name] started on heparin for ACS. Rivaroxaban noted on prior med rec, but per current med rec patient is not taking d/t money/insurance issues.    HL 0.46 at goal. H/H dropped but no over signs of bleeding and no issues with infusion per RN. Reportedly, patient had blood in stools before admission but none since being here.   Goal of Therapy:  Heparin level 0.3-0.7 units/ml Monitor platelets by anticoagulation protocol: Yes   Plan:  Continue heparin to 1250 units/hr Monitor daily HL, CBC/plt Monitor for signs/symptoms of bleeding  F/u plan after [facility] 4/12   Benetta Spar, PharmD, BCPS, [facility] Clinical Pharmacist  Please check AMION for all [place] phone numbers After 10:00 PM, call [place] [phone]

## 2020-03-03 NOTE — H&P (View-Only) (Signed)
Progress Note  Patient Name: Rhonda Roberts Date of Encounter: 03/03/2020  Primary Cardiologist: No primary care provider on file.   Subjective   Feeling.  This morning she is nauseous.  Zofran did help some.  She also noted that her stools are becoming dark again.  She is no longer having diarrhea.  Inpatient Medications    Scheduled Meds: . aspirin  81 mg Oral Daily  . atorvastatin  80 mg Oral q1800  . carvedilol  6.25 mg Oral BID WC  . insulin aspart  0-20 Units Subcutaneous TID WC  . insulin aspart  0-5 Units Subcutaneous QHS  . insulin aspart  10 Units Subcutaneous TID WC  . insulin NPH Human  40 Units Subcutaneous Q12H  . loratadine  10 mg Oral Daily  . losartan  25 mg Oral Daily  . metFORMIN  500 mg Oral BID WC   Continuous Infusions: . heparin 1,250 Units/hr (03/02/20 1449)   PRN Meds: acetaminophen, metoprolol tartrate, nitroGLYCERIN, ondansetron (ZOFRAN) IV, sodium chloride flush   Vital Signs    Vitals:   03/02/20 2103 03/02/20 2149 03/03/20 0541 03/03/20 0930  BP: 90/69 133/90 (!) 135/92 (!) 135/91  Pulse: 87  86 87  Resp: 19  19   Temp: 98.7 F (37.1 C)  98 F (36.7 C)   TempSrc: Oral  Oral   SpO2: 98%  98%   Weight:   136 kg   Height:        Intake/Output Summary (Last 24 hours) at 03/03/2020 1045 Last data filed at 03/02/2020 1945 Gross per 24 hour  Intake 735.03 ml  Output 450 ml  Net 285.03 ml   Last 3 Weights 03/03/2020 03/02/2020 03/01/2020  Weight (lbs) 299 lb 14.4 oz 298 lb 12.8 oz 287 lb  Weight (kg) 136.034 kg 135.535 kg 130.182 kg      Telemetry    Sinus rhythm.  PVCs.- Personally Reviewed  ECG    03/03/2020: Sinus rhythm.  Rate 87 bpm.  Lateral T wave abnormalities. - Personally Reviewed  Physical Exam   VS:  BP (!) 135/91   Pulse 87   Temp 98 F (36.7 C) (Oral)   Resp 19   Ht 5\' 2"  (1.575 m)   Wt 136 kg   SpO2 98%   BMI 54.85 kg/m  , BMI Body mass index is 54.85 kg/m. GENERAL: Feels poorly.  Mildly  diaphoretic HEENT: Pupils equal round and reactive, fundi not visualized, oral mucosa unremarkable NECK:  No jugular venous distention, waveform within normal limits, carotid upstroke brisk and symmetric, no bruits LUNGS:  Clear to auscultation bilaterally HEART:  RRR.  PMI not displaced or sustained,S1 and S2 within normal limits, no S3, no S4, no clicks, no rubs, no murmurs ABD:  Flat, positive bowel sounds normal in frequency in pitch, no bruits, no rebound, no guarding, no midline pulsatile mass, no hepatomegaly, no splenomegaly EXT:  2 plus pulses throughout, 1+ lower extremity edema to the lower tibia bilaterally, no cyanosis no clubbing SKIN:  No rashes no nodules NEURO:  Cranial nerves II through XII grossly intact, motor grossly intact throughout PSYCH:  Cognitively intact, oriented to person place and time   Labs    High Sensitivity Troponin:   Recent Labs  Lab 03/01/20 1832 03/01/20 2020 03/02/20 0355  TROPONINIHS 2,225* 3,272* 10,478*      Chemistry Recent Labs  Lab 03/01/20 1832 03/02/20 0355  NA 135 137  K 3.9 3.7  CL 98 101  CO2 27  23  GLUCOSE 286* 244*  BUN 23 21  CREATININE 1.28* 1.22*  CALCIUM 9.2 8.7*  PROT 9.0*  --   ALBUMIN 3.8  --   AST 36  --   ALT 24  --   ALKPHOS 113  --   BILITOT 0.6  --   GFRNONAA 44* 47*  GFRAA 51* 54*  ANIONGAP 10 13     Hematology Recent Labs  Lab 03/01/20 1832 03/03/20 0557  WBC 11.1* 11.5*  RBC 4.99 4.10  HGB 14.3 11.7*  HCT 44.0 36.2  MCV 88.2 88.3  MCH 28.7 28.5  MCHC 32.5 32.3  RDW 14.6 15.0  PLT 294 271    BNPNo results for input(s): BNP, PROBNP in the last 168 hours.   DDimer No results for input(s): DDIMER in the last 168 hours.   Radiology    DG Chest 2 View  Result Date: 03/01/2020 CLINICAL DATA:  Chest pain EXAM: CHEST - 2 VIEW COMPARISON:  12/18/2016 FINDINGS: Peribronchial thickening and interstitial prominence. Heart is normal size. No effusions or acute bony abnormality. IMPRESSION:  Bronchitic changes. Electronically Signed   By: Rolm Baptise M.D.   On: 03/01/2020 18:58   ECHOCARDIOGRAM COMPLETE  Result Date: 03/02/2020    ECHOCARDIOGRAM REPORT   Patient Name:   Rhonda Roberts Date of Exam: 03/02/2020 Medical Rec #:  [id number]         Height:       62.0 in Accession #:    [id number]        Weight:       298.8 lb Date of Birth:  06/22/1956         BSA:          2.267 m Patient Age:    61 years          BP:           136/94 mmHg Patient Gender: F                 HR:           88 bpm. Exam Location:  Inpatient Procedure: 2D Echo, Cardiac Doppler, Color Doppler and Intracardiac            Opacification Agent Indications:     R07.9* Chest pain, unspecified  History:         Patient has no prior history of Echocardiogram examinations.                  Signs/Symptoms:Chest Pain; Risk Factors:Diabetes, Dyslipidemia                  and Hypertension. Elevated troponin.  Sonographer:     Roseanna Rainbow RDCS Referring Phys:  [id number] SCOTT W ROSE Diagnosing Phys: Mertie Moores MD  Sonographer Comments: Technically difficult study due to poor echo windows and patient is morbidly obese. Image acquisition challenging due to patient body habitus. IMPRESSIONS  1. Left ventricular ejection fraction, by estimation, is 40 to 45%. The left ventricle has mildly decreased function. The left ventricle has no regional wall motion abnormalities. There is mild concentric left ventricular hypertrophy. Left ventricular diastolic parameters are consistent with Grade II diastolic dysfunction (pseudonormalization). There is severe akinesis of the left ventricular, apical anteroseptal wall and lateral wall.  2. Right ventricular systolic function is mildly reduced. The right ventricular size is mildly enlarged. There is normal pulmonary artery systolic pressure.  3. The mitral valve is normal in structure. Trivial mitral valve regurgitation. No evidence of  mitral stenosis.  4. The aortic valve is normal in structure. Aortic  valve regurgitation is not visualized. No aortic stenosis is present. FINDINGS  Left Ventricle: Left ventricular ejection fraction, by estimation, is 40 to 45%. The left ventricle has mildly decreased function. The left ventricle has no regional wall motion abnormalities. Severe akinesis of the left ventricular, apical anteroseptal  wall and lateral wall. Definity contrast agent was given IV to delineate the left ventricular endocardial borders. The left ventricular internal cavity size was normal in size. There is mild concentric left ventricular hypertrophy. Left ventricular diastolic parameters are consistent with Grade II diastolic dysfunction (pseudonormalization). Right Ventricle: The right ventricular size is mildly enlarged. No increase in right ventricular wall thickness. Right ventricular systolic function is mildly reduced. There is normal pulmonary artery systolic pressure. The tricuspid regurgitant velocity  is 2.10 m/s, and with an assumed right atrial pressure of 3 mmHg, the estimated right ventricular systolic pressure is 123XX123 mmHg. Left Atrium: Left atrial size was normal in size. Right Atrium: Right atrial size was normal in size. Pericardium: There is no evidence of pericardial effusion. Mitral Valve: The mitral valve is normal in structure. Trivial mitral valve regurgitation. No evidence of mitral valve stenosis. Tricuspid Valve: The tricuspid valve is grossly normal. Tricuspid valve regurgitation is not demonstrated. No evidence of tricuspid stenosis. Aortic Valve: The aortic valve is normal in structure. Aortic valve regurgitation is not visualized. No aortic stenosis is present. Pulmonic Valve: The pulmonic valve was grossly normal. Pulmonic valve regurgitation is trivial. No evidence of pulmonic stenosis. Aorta: The aortic root and ascending aorta are structurally normal, with no evidence of dilitation. IAS/Shunts: The atrial septum is grossly normal.  LEFT VENTRICLE PLAX 2D LVIDd:          4.10 cm     Diastology LVIDs:         2.80 cm     LV e' lateral:   7.94 cm/s LV PW:         1.30 cm     LV E/e' lateral: 16.2 LV IVS:        1.30 cm     LV e' medial:    6.09 cm/s LVOT diam:     1.70 cm     LV E/e' medial:  21.2 LV SV:         67 LV SV Index:   30 LVOT Area:     2.27 cm  LV Volumes (MOD) LV vol d, MOD A2C: 69.0 ml LV vol d, MOD A4C: 89.2 ml LV vol s, MOD A2C: 45.9 ml LV vol s, MOD A4C: 46.6 ml LV SV MOD A2C:     23.1 ml LV SV MOD A4C:     89.2 ml LV SV MOD BP:      31.9 ml RIGHT VENTRICLE             IVC RV S prime:     10.80 cm/s  IVC diam: 2.00 cm TAPSE (M-mode): 1.7 cm LEFT ATRIUM             Index       RIGHT ATRIUM           Index LA diam:        3.40 cm 1.50 cm/m  RA Area:     10.00 cm LA Vol (A2C):   19.1 ml 8.42 ml/m  RA Volume:   17.80 ml  7.85 ml/m LA Vol (A4C):   32.3 ml 14.25 ml/m LA Biplane  Vol: 25.0 ml 11.03 ml/m  AORTIC VALVE LVOT Vmax:   159.00 cm/s LVOT Vmean:  115.000 cm/s LVOT VTI:    0.297 m  AORTA Ao Root diam: 2.70 cm Ao Asc diam:  3.00 cm MITRAL VALVE                TRICUSPID VALVE MV Area (PHT): 3.99 cm     TR Peak grad:   17.6 mmHg MV Decel Time: 190 msec     TR Vmax:        210.00 cm/s MV E velocity: 129.00 cm/s MV A velocity: 140.00 cm/s  SHUNTS MV E/A ratio:  0.92         Systemic VTI:  0.30 m                             Systemic Diam: 1.70 cm Mertie Moores MD Electronically signed by Mertie Moores MD Signature Date/Time: 03/02/2020/4:18:42 PM    Final (Updated)    CT Angio Chest/Abd/Pel for Dissection W and/or Wo Contrast  Result Date: 03/01/2020 CLINICAL DATA:  64 year old female with chest and back pain. Concern for aortic dissection. EXAM: CT ANGIOGRAPHY CHEST, ABDOMEN AND PELVIS TECHNIQUE: Non-contrast CT of the chest was initially obtained. Multidetector CT imaging through the chest, abdomen and pelvis was performed using the standard protocol during bolus administration of intravenous contrast. Multiplanar reconstructed images and MIPs were obtained and  reviewed to evaluate the vascular anatomy. CONTRAST:  18mL OMNIPAQUE IOHEXOL 350 MG/ML SOLN COMPARISON:  CT abdomen pelvis dated 02/28/2015. FINDINGS: Evaluation of this exam is limited due to respiratory motion artifact. CTA CHEST FINDINGS Cardiovascular: There is no cardiomegaly or pericardial effusion. The thoracic aorta is unremarkable. The origins of the great vessels of the aortic arch appear patent as visualized. The central pulmonary arteries are patent. Mediastinum/Nodes: There is no hilar or mediastinal adenopathy. The esophagus and the thyroid gland are grossly unremarkable as visualized. No mediastinal fluid collection. Lungs/Pleura: The lungs are clear. There is no pleural effusion or pneumothorax. The central airways are patent. Musculoskeletal: Degenerative changes of the spine. No acute osseous pathology. Review of the MIP images confirms the above findings. CTA ABDOMEN AND PELVIS FINDINGS VASCULAR Aorta: Normal caliber aorta without aneurysm, dissection, vasculitis or significant stenosis. Celiac: Patent without evidence of aneurysm, dissection, vasculitis or significant stenosis. SMA: Patent without evidence of aneurysm, dissection, vasculitis or significant stenosis. Renals: Both renal arteries are patent without evidence of aneurysm, dissection, vasculitis, fibromuscular dysplasia or significant stenosis. IMA: Patent without evidence of aneurysm, dissection, vasculitis or significant stenosis. Inflow: Mild atherosclerotic calcification of the iliac arteries. No aneurysmal dilatation or dissection. The iliac arteries are patent. Veins: No obvious venous abnormality within the limitations of this arterial phase study. Review of the MIP images confirms the above findings. NON-VASCULAR Hepatobiliary: The liver is unremarkable. No intrahepatic biliary ductal dilatation. The gallbladder is unremarkable. Pancreas: Unremarkable. No pancreatic ductal dilatation or surrounding inflammatory changes.  Spleen: Normal in size without focal abnormality. Adrenals/Urinary Tract: The adrenal glands are unremarkable. The kidneys, and the visualized ureters appear unremarkable. The urinary bladder is partially distended. There is a focus of air within the bladder which may be related to recent instrumentation or secondary to an infectious process. Clinical correlation is recommended. Stomach/Bowel: There is sigmoid diverticulosis and scattered colonic diverticula without active inflammatory changes. There is no bowel obstruction or active inflammation. The appendix is normal. Lymphatic: No adenopathy. Reproductive: Hysterectomy. No adnexal masses. Other: None  Musculoskeletal: Degenerative changes of the spine with multilevel disc desiccation and vacuum phenomena. No acute osseous pathology. Review of the MIP images confirms the above findings. IMPRESSION: 1. No acute intrathoracic, abdominal, or pelvic pathology. No aortic aneurysm or dissection. 2. Colonic diverticulosis.  No bowel obstruction. Normal appendix. 3. A focus of air within the bladder may be related to recent instrumentation or secondary to an infectious process. Clinical correlation is recommended. Electronically Signed   By: Anner Crete M.D.   On: 03/01/2020 21:07    Cardiac Studies   Echo 03/02/2020: IMPRESSIONS    1. Left ventricular ejection fraction, by estimation, is 40 to 45%. The  left ventricle has mildly decreased function. The left ventricle has no  regional wall motion abnormalities. There is mild concentric left  ventricular hypertrophy. Left ventricular  diastolic parameters are consistent with Grade II diastolic dysfunction  (pseudonormalization). There is severe akinesis of the left ventricular,  apical anteroseptal wall and lateral wall.  2. Right ventricular systolic function is mildly reduced. The right  ventricular size is mildly enlarged. There is normal pulmonary artery  systolic pressure.  3. The mitral  valve is normal in structure. Trivial mitral valve  regurgitation. No evidence of mitral stenosis.  4. The aortic valve is normal in structure. Aortic valve regurgitation is  not visualized. No aortic stenosis is present.   Patient Profile     Ms. Kreeger is a 54F with diabetes, hypertension, hyperlipidemia, morbid obesity, and GERD admitted with NSTEMI.  Assessment & Plan    # NSTEMI: High-sensitivity troponin has continued to rise to 10,000.  We will repeat lab today as there was a big increase from the 2nd-3rd assessment yesterday.  LDL was 159 on admission.  Continue aspirin and heparin for now.  Concern for GI bleed as below.  Continue aspirin, carvedilol, heparin.  Tentatively planning for cath tomorrow.    This has been discontinued.  Echo revealed LVEF 40 to 45% with focal wall motion abnormalities.  # Melena:  Patient reports dark stools.  Hemoglobin has dropped from 14-11.7.  We will repeat H/H.  Start pantoprazole 40 mg IV twice daily.  We will also guaiac her stools.  If she is having GI bleeding this need to be addressed prior to cath.   # Essential hypertension: Started on carvedilol and enalapril this admission.   Enalapril was switched to losartan.  Given her reduced EF will plan to transition to [facility] after cath.  #Acute systolic diastolic heart failure: Ischemic evaluation is needed.  Pending follow-up values for her H/H as above.  She has some mild lower extremity edema.  # Air in bladder:  Noted on CT chest/abd/pelvis on 4/9.  Urinalysis was negative for infection.  # Suspected acute gastroenteritis:  Patient nauseous again.  Viral studies negative.  # DM:  Needs to establish with PCP as an outpatient.  Add metformin at discharged.  Given her reduced ejection fraction she would be a good candidate for an SGLT2 inhibitor.  For questions or updates, please contact [place] Please consult [email or web address] for contact info under        Signed, Skeet Latch, MD  03/03/2020, 10:45 AM

## 2020-03-04 ENCOUNTER — Inpatient Hospital Stay (HOSPITAL_COMMUNITY): Payer: Self-pay

## 2020-03-04 ENCOUNTER — Encounter (HOSPITAL_COMMUNITY)
Admission: EM | Disposition: A | Discharge status: Home Health | Payer: Self-pay | Source: Home / Self Care | Attending: Cardiothoracic Surgery

## 2020-03-04 ENCOUNTER — Other Ambulatory Visit: Payer: Self-pay | Admitting: *Deleted

## 2020-03-04 ENCOUNTER — Inpatient Hospital Stay: Payer: Self-pay

## 2020-03-04 ENCOUNTER — Inpatient Hospital Stay (HOSPITAL_COMMUNITY): Payer: Medicaid Other

## 2020-03-04 DIAGNOSIS — I161 Hypertensive emergency: Secondary | ICD-10-CM

## 2020-03-04 DIAGNOSIS — I5042 Chronic combined systolic (congestive) and diastolic (congestive) heart failure: Secondary | ICD-10-CM

## 2020-03-04 DIAGNOSIS — J9601 Acute respiratory failure with hypoxia: Secondary | ICD-10-CM

## 2020-03-04 DIAGNOSIS — I25110 Atherosclerotic heart disease of native coronary artery with unstable angina pectoris: Secondary | ICD-10-CM

## 2020-03-04 DIAGNOSIS — J9600 Acute respiratory failure, unspecified whether with hypoxia or hypercapnia: Secondary | ICD-10-CM

## 2020-03-04 DIAGNOSIS — I5021 Acute systolic (congestive) heart failure: Secondary | ICD-10-CM

## 2020-03-04 DIAGNOSIS — I25119 Atherosclerotic heart disease of native coronary artery with unspecified angina pectoris: Secondary | ICD-10-CM

## 2020-03-04 DIAGNOSIS — I2510 Atherosclerotic heart disease of native coronary artery without angina pectoris: Secondary | ICD-10-CM

## 2020-03-04 DIAGNOSIS — I214 Non-ST elevation (NSTEMI) myocardial infarction: Secondary | ICD-10-CM

## 2020-03-04 HISTORY — PX: LEFT HEART CATH AND CORONARY ANGIOGRAPHY: CATH118249

## 2020-03-04 LAB — CBC
HCT: 34.7 % — ABNORMAL LOW (ref 36.0–46.0)
Hemoglobin: 11.4 g/dL — ABNORMAL LOW (ref 12.0–15.0)
MCH: 28.9 pg (ref 26.0–34.0)
MCHC: 32.9 g/dL (ref 30.0–36.0)
MCV: 87.8 fL (ref 80.0–100.0)
Platelets: 266 10*3/uL (ref 150–400)
RBC: 3.95 MIL/uL (ref 3.87–5.11)
RDW: 14.9 % (ref 11.5–15.5)
WBC: 11.9 10*3/uL — ABNORMAL HIGH (ref 4.0–10.5)
nRBC: 0 % (ref 0.0–0.2)

## 2020-03-04 LAB — ECHOCARDIOGRAM LIMITED
Height: 62 in
Weight: 4830.4 oz

## 2020-03-04 LAB — GLUCOSE, CAPILLARY
Glucose-Capillary: 109 mg/dL — ABNORMAL HIGH (ref 70–99)
Glucose-Capillary: 100 mg/dL — ABNORMAL HIGH (ref 70–99)
Glucose-Capillary: 129 mg/dL — ABNORMAL HIGH (ref 70–99)
Glucose-Capillary: 156 mg/dL — ABNORMAL HIGH (ref 70–99)
Glucose-Capillary: 188 mg/dL — ABNORMAL HIGH (ref 70–99)

## 2020-03-04 LAB — URINALYSIS, COMPLETE (UACMP) WITH MICROSCOPIC
Bilirubin Urine: NEGATIVE
Glucose, UA: NEGATIVE mg/dL
Ketones, ur: NEGATIVE mg/dL
Nitrite: NEGATIVE
Protein, ur: 100 mg/dL — AB
Specific Gravity, Urine: 1.026 (ref 1.005–1.030)
pH: 5 (ref 5.0–8.0)

## 2020-03-04 LAB — SURGICAL PCR SCREEN
MRSA, PCR: NEGATIVE
Staphylococcus aureus: NEGATIVE

## 2020-03-04 LAB — BASIC METABOLIC PANEL
Anion gap: 11 (ref 5–15)
BUN: 26 mg/dL — ABNORMAL HIGH (ref 8–23)
CO2: 24 mmol/L (ref 22–32)
Calcium: 9.1 mg/dL (ref 8.9–10.3)
Chloride: 100 mmol/L (ref 98–111)
Creatinine, Ser: 1.32 mg/dL — ABNORMAL HIGH (ref 0.44–1.00)
GFR calc Af Amer: 49 mL/min — ABNORMAL LOW (ref >60)
GFR calc non Af Amer: 43 mL/min — ABNORMAL LOW (ref >60)
Glucose, Bld: 146 mg/dL — ABNORMAL HIGH (ref 70–99)
Potassium: 3.7 mmol/L (ref 3.5–5.1)
Sodium: 135 mmol/L (ref 135–145)

## 2020-03-04 LAB — HEMOGLOBIN A1C
Hgb A1c MFr Bld: 10 % — ABNORMAL HIGH (ref 4.8–5.6)
Mean Plasma Glucose: 240 mg/dL

## 2020-03-04 LAB — OCCULT BLOOD X 1 CARD TO LAB, STOOL: Fecal Occult Bld: NEGATIVE

## 2020-03-04 LAB — MAGNESIUM: Magnesium: 2.2 mg/dL (ref 1.7–2.4)

## 2020-03-04 LAB — HEPARIN LEVEL (UNFRACTIONATED): Heparin Unfractionated: 0.62 IU/mL (ref 0.30–0.70)

## 2020-03-04 SURGERY — LEFT HEART CATH AND CORONARY ANGIOGRAPHY
Anesthesia: LOCAL

## 2020-03-04 MED ORDER — FUROSEMIDE 10 MG/ML IJ SOLN
INTRAMUSCULAR | Status: AC
  Filled 2020-03-04: qty 4

## 2020-03-04 MED ORDER — SODIUM CHLORIDE 0.9% FLUSH: 10.0000 mL | INTRAVENOUS | Status: DC | PRN

## 2020-03-04 MED ORDER — IOHEXOL 350 MG/ML SOLN
INTRAVENOUS | Status: DC | PRN
  Administered 2020-03-04: 60 mL via INTRA_ARTERIAL

## 2020-03-04 MED ORDER — LIDOCAINE HCL (PF) 1 % IJ SOLN
INTRAMUSCULAR | Status: DC | PRN
  Administered 2020-03-04: 2 mL

## 2020-03-04 MED ORDER — NITROGLYCERIN IN D5W 200-5 MCG/ML-% IV SOLN
INTRAVENOUS | Status: AC
  Filled 2020-03-04: qty 250

## 2020-03-04 MED ORDER — OXYCODONE HCL 5 MG PO TABS: 5.0000 mg | ORAL_TABLET | ORAL | Status: DC | PRN

## 2020-03-04 MED ORDER — FENTANYL CITRATE (PF) 100 MCG/2ML IJ SOLN
INTRAMUSCULAR | Status: DC | PRN
  Administered 2020-03-04: 25 ug via INTRAVENOUS

## 2020-03-04 MED ORDER — CHLORHEXIDINE GLUCONATE CLOTH 2 % EX PADS
6.0000 | MEDICATED_PAD | Freq: Every day | CUTANEOUS | Status: DC
  Administered 2020-03-04 – 2020-03-05 (×2): 6 via TOPICAL

## 2020-03-04 MED ORDER — SODIUM CHLORIDE 0.9 % IV SOLN
1.0000 g | INTRAVENOUS | Status: DC
  Administered 2020-03-04 – 2020-03-11 (×7): 1 g via INTRAVENOUS
  Filled 2020-03-04: qty 1
  Filled 2020-03-04 (×2): qty 10
  Filled 2020-03-04 (×2): qty 1
  Filled 2020-03-04: qty 10
  Filled 2020-03-04: qty 1
  Filled 2020-03-04: qty 10

## 2020-03-04 MED ORDER — ISOSORBIDE MONONITRATE ER 30 MG PO TB24
30.0000 mg | ORAL_TABLET | Freq: Every day | ORAL | Status: DC
  Administered 2020-03-04 – 2020-03-05 (×2): 30 mg via ORAL
  Filled 2020-03-04 (×2): qty 1

## 2020-03-04 MED ORDER — SODIUM CHLORIDE 0.9% FLUSH
3.0000 mL | Freq: Two times a day (BID) | INTRAVENOUS | Status: DC
  Administered 2020-03-04 – 2020-03-05 (×3): 3 mL via INTRAVENOUS

## 2020-03-04 MED ORDER — LIVING BETTER WITH HEART FAILURE BOOK: Freq: Once | Status: AC

## 2020-03-04 MED ORDER — MIDAZOLAM HCL 2 MG/2ML IJ SOLN
INTRAMUSCULAR | Status: AC
  Filled 2020-03-04: qty 2

## 2020-03-04 MED ORDER — SODIUM CHLORIDE 0.9% FLUSH: 3.0000 mL | INTRAVENOUS | Status: DC | PRN

## 2020-03-04 MED ORDER — HEPARIN SODIUM (PORCINE) 1000 UNIT/ML IJ SOLN
INTRAMUSCULAR | Status: DC | PRN
  Administered 2020-03-04: 6000 [IU] via INTRAVENOUS

## 2020-03-04 MED ORDER — FUROSEMIDE 10 MG/ML IJ SOLN
80.0000 mg | Freq: Two times a day (BID) | INTRAMUSCULAR | Status: DC
  Administered 2020-03-04: 20:00:00 80 mg via INTRAVENOUS
  Filled 2020-03-04: qty 8

## 2020-03-04 MED ORDER — FUROSEMIDE 10 MG/ML IJ SOLN
80.0000 mg | Freq: Once | INTRAMUSCULAR | Status: AC
  Administered 2020-03-04: 80 mg via INTRAVENOUS

## 2020-03-04 MED ORDER — NITROGLYCERIN IN D5W 200-5 MCG/ML-% IV SOLN
INTRAVENOUS | Status: AC | PRN
  Administered 2020-03-04: 10 ug/min via INTRAVENOUS

## 2020-03-04 MED ORDER — MIDAZOLAM HCL 2 MG/2ML IJ SOLN
INTRAMUSCULAR | Status: DC | PRN
  Administered 2020-03-04: 1 mg via INTRAVENOUS

## 2020-03-04 MED ORDER — SODIUM CHLORIDE 0.9% FLUSH
10.0000 mL | Freq: Two times a day (BID) | INTRAVENOUS | Status: DC
  Administered 2020-03-04 – 2020-03-05 (×2): 10 mL

## 2020-03-04 MED ORDER — SODIUM CHLORIDE 0.9 % IV SOLN: 250.0000 mL | INTRAVENOUS | Status: DC | PRN

## 2020-03-04 MED ORDER — ONDANSETRON HCL 4 MG/2ML IJ SOLN
4.0000 mg | Freq: Four times a day (QID) | INTRAMUSCULAR | Status: DC | PRN
  Administered 2020-03-05: 15:00:00 4 mg via INTRAVENOUS
  Filled 2020-03-04: qty 2

## 2020-03-04 MED ORDER — FENTANYL CITRATE (PF) 100 MCG/2ML IJ SOLN
INTRAMUSCULAR | Status: AC
  Filled 2020-03-04: qty 2

## 2020-03-04 MED ORDER — HEPARIN (PORCINE) IN NACL 1000-0.9 UT/500ML-% IV SOLN
INTRAVENOUS | Status: DC | PRN
  Administered 2020-03-04 (×2): 500 mL

## 2020-03-04 MED ORDER — ASPIRIN 81 MG PO CHEW
81.0000 mg | CHEWABLE_TABLET | Freq: Every day | ORAL | Status: DC
  Administered 2020-03-05: 10:00:00 81 mg via ORAL
  Filled 2020-03-04: qty 1

## 2020-03-04 MED ORDER — HEPARIN (PORCINE) IN NACL 1000-0.9 UT/500ML-% IV SOLN
INTRAVENOUS | Status: AC
  Filled 2020-03-04: qty 1000

## 2020-03-04 MED ORDER — POTASSIUM CHLORIDE CRYS ER 20 MEQ PO TBCR
20.0000 meq | EXTENDED_RELEASE_TABLET | Freq: Once | ORAL | Status: AC
  Administered 2020-03-04: 09:00:00 20 meq via ORAL
  Filled 2020-03-04: qty 1

## 2020-03-04 MED ORDER — SODIUM CHLORIDE 0.9 % IV SOLN: INTRAVENOUS | Status: DC

## 2020-03-04 MED ORDER — HEPARIN SODIUM (PORCINE) 1000 UNIT/ML IJ SOLN
INTRAMUSCULAR | Status: AC
  Filled 2020-03-04: qty 1

## 2020-03-04 MED ORDER — ACETAMINOPHEN 325 MG PO TABS
650.0000 mg | ORAL_TABLET | ORAL | Status: DC | PRN
  Administered 2020-03-05: 08:00:00 650 mg via ORAL
  Filled 2020-03-04: qty 2

## 2020-03-04 MED ORDER — LABETALOL HCL 5 MG/ML IV SOLN: 10.0000 mg | INTRAVENOUS | Status: AC | PRN

## 2020-03-04 MED ORDER — VERAPAMIL HCL 2.5 MG/ML IV SOLN
INTRAVENOUS | Status: AC
  Filled 2020-03-04: qty 2

## 2020-03-04 MED ORDER — FUROSEMIDE 10 MG/ML IJ SOLN
80.0000 mg | Freq: Once | INTRAMUSCULAR | Status: AC
  Administered 2020-03-04: 13:00:00 80 mg via INTRAVENOUS

## 2020-03-04 MED ORDER — NITROGLYCERIN IN D5W 200-5 MCG/ML-% IV SOLN
10.0000 ug/min | INTRAVENOUS | Status: DC
  Administered 2020-03-04: 10 ug/min via INTRAVENOUS

## 2020-03-04 MED ORDER — PERFLUTREN LIPID MICROSPHERE
1.0000 mL | INTRAVENOUS | Status: AC | PRN
  Administered 2020-03-04: 15:00:00 2 mL via INTRAVENOUS
  Filled 2020-03-04: qty 10

## 2020-03-04 MED ORDER — FUROSEMIDE 10 MG/ML IJ SOLN
INTRAMUSCULAR | Status: DC | PRN
  Administered 2020-03-04: 40 mg via INTRAVENOUS

## 2020-03-04 MED ORDER — FUROSEMIDE 10 MG/ML IJ SOLN
INTRAMUSCULAR | Status: AC
  Filled 2020-03-04: qty 8

## 2020-03-04 MED ORDER — HEPARIN (PORCINE) 25000 UT/250ML-% IV SOLN
1250.0000 [IU]/h | INTRAVENOUS | Status: DC
  Administered 2020-03-04 – 2020-03-05 (×3): 1250 [IU]/h via INTRAVENOUS
  Filled 2020-03-04 (×2): qty 250

## 2020-03-04 MED ORDER — HYDRALAZINE HCL 20 MG/ML IJ SOLN: 10.0000 mg | INTRAMUSCULAR | Status: AC | PRN

## 2020-03-04 MED ORDER — SODIUM CHLORIDE 0.9% FLUSH
3.0000 mL | Freq: Two times a day (BID) | INTRAVENOUS | Status: DC
  Administered 2020-03-04 – 2020-03-05 (×3): 3 mL via INTRAVENOUS

## 2020-03-04 MED ORDER — LIDOCAINE HCL (PF) 1 % IJ SOLN
INTRAMUSCULAR | Status: AC
  Filled 2020-03-04: qty 30

## 2020-03-04 MED ORDER — HYDRALAZINE HCL 25 MG PO TABS
12.5000 mg | ORAL_TABLET | Freq: Three times a day (TID) | ORAL | Status: DC
  Administered 2020-03-04 – 2020-03-06 (×5): 12.5 mg via ORAL
  Filled 2020-03-04 (×5): qty 1

## 2020-03-04 MED ORDER — VERAPAMIL HCL 2.5 MG/ML IV SOLN
INTRAVENOUS | Status: DC | PRN
  Administered 2020-03-04: 10 mL via INTRA_ARTERIAL

## 2020-03-04 SURGICAL SUPPLY — 11 items
CATH 5FR JL3.5 JR4 ANG PIG MP (CATHETERS) ×1 IMPLANT
DEVICE RAD COMP TR BAND LRG (VASCULAR PRODUCTS) ×1 IMPLANT
GLIDESHEATH SLEND A-KIT 6F 22G (SHEATH) ×1 IMPLANT
GUIDEWIRE INQWIRE 1.5J.035X260 (WIRE) IMPLANT
HOVERMATT SINGLE USE (MISCELLANEOUS) ×1 IMPLANT
INQWIRE 1.5J .035X260CM (WIRE) ×2
KIT HEART LEFT (KITS) ×2 IMPLANT
PACK CARDIAC CATHETERIZATION (CUSTOM PROCEDURE TRAY) ×2 IMPLANT
SHEATH PROBE COVER 6X72 (BAG) ×1 IMPLANT
TRANSDUCER W/STOPCOCK (MISCELLANEOUS) ×2 IMPLANT
TUBING CIL FLEX 10 FLL-RA (TUBING) ×2 IMPLANT

## 2020-03-04 NOTE — Progress Notes (Signed)
Peripherally Inserted Central Catheter Placement  The IV Nurse has discussed with the patient and/or persons authorized to consent for the patient, the purpose of this procedure and the potential benefits and risks involved with this procedure.  The benefits include less needle sticks, lab draws from the catheter, and the patient may be discharged home with the catheter. Risks include, but not limited to, infection, bleeding, blood clot (thrombus formation), and puncture of an artery; nerve damage and irregular heartbeat and possibility to perform a PICC exchange if needed/ordered by physician.  Alternatives to this procedure were also discussed.  Bard Power PICC patient education guide, fact sheet on infection prevention and patient information card has been provided to patient /or left at bedside.    PICC Placement Documentation  PICC Triple Lumen 03/04/20 PICC Right Cephalic 42 cm 0 cm (Active)  Indication for Insertion or Continuance of Line Vasoactive infusions 03/04/20 2059  Exposed Catheter (cm) 0 cm 03/04/20 2059  Site Assessment Clean;Dry;Intact 03/04/20 2059  Lumen #1 Status Flushed;Saline locked;Blood return noted 03/04/20 2059  Lumen #2 Status Flushed;Saline locked;Blood return noted 03/04/20 2059  Lumen #3 Status Flushed;Saline locked;Blood return noted 03/04/20 2059  Dressing Type Transparent 03/04/20 2059  Dressing Status Clean;Dry;Intact;Antimicrobial disc in place 03/04/20 2059  Dressing Change Due 03/11/20 03/04/20 2059       Gordan Payment 03/04/2020, 9:00 PM

## 2020-03-04 NOTE — Interval H&P Note (Signed)
Cath Lab Visit (complete for each Cath Lab visit)  Clinical Evaluation Leading to the Procedure:   ACS: Yes.    Non-ACS:    Anginal Classification: CCS III  Anti-ischemic medical therapy: Maximal Therapy (2 or more classes of medications)  Non-Invasive Test Results: Intermediate-risk stress test findings: cardiac mortality 1-3%/year  Prior CABG: No previous CABG      History and Physical Interval Note:  03/04/2020 11:33 AM  Rhonda Roberts  has presented today for surgery, with the diagnosis of NSTEMI.  The various methods of treatment have been discussed with the patient and family. After consideration of risks, benefits and other options for treatment, the patient has consented to  Procedure(s): LEFT HEART CATH AND CORONARY ANGIOGRAPHY (N/A) as a surgical intervention.  The patient's history has been reviewed, patient examined, no change in status, stable for surgery.  I have reviewed the patient's chart and labs.  Questions were answered to the patient's satisfaction.     Belva Crome III

## 2020-03-04 NOTE — Progress Notes (Signed)
RT assisted with patient transport from Cath Lab [facility] to [id number] without complications.  Upon settling patient in new bed, patient has clear bilateral BS, RT removed patient from Bipap and placed her on 4L [place] with humidity. Patient tolerated well. MD at bedside.  Bipap on standby.

## 2020-03-04 NOTE — CV Procedure (Addendum)
   Coronary angiography, left ventriculography, via right radial using real-time vascular ultrasound guidance.    99% ostial LAD; 80% mid LAD, and 80% diagonal.  80% mid circumflex, 70% ostial circumflex, 99% small ramus.  95% mid LAD  70% distal RCA and Mid 99% PDA   LVEDP 38 mmHg.  EF less than 30%.  Global hypokinesis.  Anterior wall worse than other walls.  Needs multivessel coronary bypass grafting.  Severe LV dysfunction, obesity, and other comorbidities increased risk for surgical morbidity and mortality.  CTS consultation to help determine ultimate treatment strategy   Post Procedure while transferring to stretcher from cath table:  CRITICAL CARE  Patient developed sudden shortness of breath. Rales and rhonchi loudly audible. Felt better sitting upright.  40 mg IV Lasix and IV nitroglycerin started. BP 140/80 mmHg with O2 Sat initially 89%  Escalated to BiPAP over ~ 10 minutes after no response to lasix.  BIPAP gradually increased O2 sat to 93%  Discussed case with Dr. Harrell Gave and Aundra Dubin ( Advanced Heart Failure service)   CRITICAL CARE TIME: 38 minutes

## 2020-03-04 NOTE — Progress Notes (Addendum)
Progress Note  Patient Name: Rhonda Roberts Date of Encounter: 03/04/2020  Primary Cardiologist: Skeet Latch, MD  Subjective   Feeling much better today. Less nauseated. Still mildly darker than normal but overall becoming more formed and looking closer to normal. 2nd FOBT was negative. No CP.  Inpatient Medications    Scheduled Meds: . aspirin  81 mg Oral Daily  . atorvastatin  80 mg Oral q1800  . carvedilol  6.25 mg Oral BID WC  . insulin aspart  0-20 Units Subcutaneous TID WC  . insulin aspart  0-5 Units Subcutaneous QHS  . insulin aspart  10 Units Subcutaneous TID WC  . insulin NPH Human  40 Units Subcutaneous Q12H  . loratadine  10 mg Oral Daily  . pantoprazole (PROTONIX) IV  40 mg Intravenous Q12H   Continuous Infusions: . heparin 1,250 Units/hr (03/04/20 0805)   PRN Meds: acetaminophen, metoprolol tartrate, nitroGLYCERIN, ondansetron (ZOFRAN) IV, sodium chloride flush   Vital Signs    Vitals:   03/03/20 1813 03/03/20 2059 03/04/20 0422 03/04/20 0805  BP: (!) 125/92 96/66 128/85 107/78  Pulse:  82 88 84  Resp:  19 20   Temp:  98.8 F (37.1 C) 98.9 F (37.2 C)   TempSrc:  Oral Oral   SpO2:  99% 98%   Weight:      Height:        Intake/Output Summary (Last 24 hours) at 03/04/2020 0819 Last data filed at 03/04/2020 0805 Gross per 24 hour  Intake 416.02 ml  Output 450 ml  Net -33.98 ml   Last 3 Weights 03/03/2020 03/02/2020 03/01/2020  Weight (lbs) 299 lb 14.4 oz 298 lb 12.8 oz 287 lb  Weight (kg) 136.034 kg 135.535 kg 130.182 kg     Telemetry    NSR, brief run of atrial tach - Personally Reviewed  Physical Exam   GEN: No acute distress.  HEENT: Normocephalic, atraumatic, sclera non-icteric. Neck: No JVD or bruits. Cardiac: RRR no murmurs, rubs, or gallops.  Radials/DP/PT 1+ and equal bilaterally.  Respiratory: Clear to auscultation bilaterally. Breathing is unlabored. GI: Soft, nontender, non-distended, BS +x 4. MS: no  deformity. Extremities: No clubbing or cyanosis. Trace BLE edema superimposed on large baseline leg habitus. Distal pedal pulses are 2+ and equal bilaterally. Neuro:  AAOx3. Follows commands. Psych:  Responds to questions appropriately with a normal affect.  Labs    High Sensitivity Troponin:   Recent Labs  Lab 03/01/20 1832 03/01/20 2020 03/02/20 0355 03/03/20 1236  TROPONINIHS 2,225* 3,272* 10,478* 8,166*      Cardiac EnzymesNo results for input(s): TROPONINI in the last 168 hours. No results for input(s): TROPIPOC in the last 168 hours.   Chemistry Recent Labs  Lab 03/01/20 1832 03/02/20 0355 03/04/20 0408  NA 135 137 135  K 3.9 3.7 3.7  CL 98 101 100  CO2 27 23 24   GLUCOSE 286* 244* 146*  BUN 23 21 26*  CREATININE 1.28* 1.22* 1.32*  CALCIUM 9.2 8.7* 9.1  PROT 9.0*  --   --   ALBUMIN 3.8  --   --   AST 36  --   --   ALT 24  --   --   ALKPHOS 113  --   --   BILITOT 0.6  --   --   GFRNONAA 44* 47* 43*  GFRAA 51* 54* 49*  ANIONGAP 10 13 11      Hematology Recent Labs  Lab 03/01/20 1832 03/01/20 1832 03/03/20 0557 03/03/20 1236 03/04/20  0408  WBC 11.1*  --  11.5*  --  11.9*  RBC 4.99  --  4.10  --  3.95  HGB 14.3   < > 11.7* 11.9* 11.4*  HCT 44.0   < > 36.2 36.8 34.7*  MCV 88.2  --  88.3  --  87.8  MCH 28.7  --  28.5  --  28.9  MCHC 32.5  --  32.3  --  32.9  RDW 14.6  --  15.0  --  14.9  PLT 294  --  271  --  266   < > = values in this interval not displayed.    BNPNo results for input(s): BNP, PROBNP in the last 168 hours.   DDimer No results for input(s): DDIMER in the last 168 hours.   Radiology    ECHOCARDIOGRAM COMPLETE  Result Date: 03/02/2020    ECHOCARDIOGRAM REPORT   Patient Name:   Rhonda Roberts Date of Exam: 03/02/2020 Medical Rec #:  [id number]         Height:       62.0 in Accession #:    [id number]        Weight:       298.8 lb Date of Birth:  January 14, 1956         BSA:          2.267 m Patient Age:    64 years          BP:            136/94 mmHg Patient Gender: F                 HR:           88 bpm. Exam Location:  Inpatient Procedure: 2D Echo, Cardiac Doppler, Color Doppler and Intracardiac            Opacification Agent Indications:     R07.9* Chest pain, unspecified  History:         Patient has no prior history of Echocardiogram examinations.                  Signs/Symptoms:Chest Pain; Risk Factors:Diabetes, Dyslipidemia                  and Hypertension. Elevated troponin.  Sonographer:     Roseanna Rainbow RDCS Referring Phys:  [id number] SCOTT W ROSE Diagnosing Phys: Mertie Moores MD  Sonographer Comments: Technically difficult study due to poor echo windows and patient is morbidly obese. Image acquisition challenging due to patient body habitus. IMPRESSIONS  1. Left ventricular ejection fraction, by estimation, is 40 to 45%. The left ventricle has mildly decreased function. The left ventricle has no regional wall motion abnormalities. There is mild concentric left ventricular hypertrophy. Left ventricular diastolic parameters are consistent with Grade II diastolic dysfunction (pseudonormalization). There is severe akinesis of the left ventricular, apical anteroseptal wall and lateral wall.  2. Right ventricular systolic function is mildly reduced. The right ventricular size is mildly enlarged. There is normal pulmonary artery systolic pressure.  3. The mitral valve is normal in structure. Trivial mitral valve regurgitation. No evidence of mitral stenosis.  4. The aortic valve is normal in structure. Aortic valve regurgitation is not visualized. No aortic stenosis is present. FINDINGS  Left Ventricle: Left ventricular ejection fraction, by estimation, is 40 to 45%. The left ventricle has mildly decreased function. The left ventricle has no regional wall motion abnormalities. Severe akinesis of the left ventricular, apical anteroseptal  wall and lateral wall. Definity contrast agent was given IV to delineate the left ventricular endocardial borders.  The left ventricular internal cavity size was normal in size. There is mild concentric left ventricular hypertrophy. Left ventricular diastolic parameters are consistent with Grade II diastolic dysfunction (pseudonormalization). Right Ventricle: The right ventricular size is mildly enlarged. No increase in right ventricular wall thickness. Right ventricular systolic function is mildly reduced. There is normal pulmonary artery systolic pressure. The tricuspid regurgitant velocity  is 2.10 m/s, and with an assumed right atrial pressure of 3 mmHg, the estimated right ventricular systolic pressure is 123XX123 mmHg. Left Atrium: Left atrial size was normal in size. Right Atrium: Right atrial size was normal in size. Pericardium: There is no evidence of pericardial effusion. Mitral Valve: The mitral valve is normal in structure. Trivial mitral valve regurgitation. No evidence of mitral valve stenosis. Tricuspid Valve: The tricuspid valve is grossly normal. Tricuspid valve regurgitation is not demonstrated. No evidence of tricuspid stenosis. Aortic Valve: The aortic valve is normal in structure. Aortic valve regurgitation is not visualized. No aortic stenosis is present. Pulmonic Valve: The pulmonic valve was grossly normal. Pulmonic valve regurgitation is trivial. No evidence of pulmonic stenosis. Aorta: The aortic root and ascending aorta are structurally normal, with no evidence of dilitation. IAS/Shunts: The atrial septum is grossly normal.  LEFT VENTRICLE PLAX 2D LVIDd:         4.10 cm     Diastology LVIDs:         2.80 cm     LV e' lateral:   7.94 cm/s LV PW:         1.30 cm     LV E/e' lateral: 16.2 LV IVS:        1.30 cm     LV e' medial:    6.09 cm/s LVOT diam:     1.70 cm     LV E/e' medial:  21.2 LV SV:         67 LV SV Index:   30 LVOT Area:     2.27 cm  LV Volumes (MOD) LV vol d, MOD A2C: 69.0 ml LV vol d, MOD A4C: 89.2 ml LV vol s, MOD A2C: 45.9 ml LV vol s, MOD A4C: 46.6 ml LV SV MOD A2C:     23.1 ml LV SV MOD  A4C:     89.2 ml LV SV MOD BP:      31.9 ml RIGHT VENTRICLE             IVC RV S prime:     10.80 cm/s  IVC diam: 2.00 cm TAPSE (M-mode): 1.7 cm LEFT ATRIUM             Index       RIGHT ATRIUM           Index LA diam:        3.40 cm 1.50 cm/m  RA Area:     10.00 cm LA Vol (A2C):   19.1 ml 8.42 ml/m  RA Volume:   17.80 ml  7.85 ml/m LA Vol (A4C):   32.3 ml 14.25 ml/m LA Biplane Vol: 25.0 ml 11.03 ml/m  AORTIC VALVE LVOT Vmax:   159.00 cm/s LVOT Vmean:  115.000 cm/s LVOT VTI:    0.297 m  AORTA Ao Root diam: 2.70 cm Ao Asc diam:  3.00 cm MITRAL VALVE                TRICUSPID VALVE MV Area (PHT):  3.99 cm     TR Peak grad:   17.6 mmHg MV Decel Time: 190 msec     TR Vmax:        210.00 cm/s MV E velocity: 129.00 cm/s MV A velocity: 140.00 cm/s  SHUNTS MV E/A ratio:  0.92         Systemic VTI:  0.30 m                             Systemic Diam: 1.70 cm Mertie Moores MD Electronically signed by Mertie Moores MD Signature Date/Time: 03/02/2020/4:18:42 PM    Final (Updated)     Cardiac Studies   2D echo 03/02/20 1. Left ventricular ejection fraction, by estimation, is 40 to 45%. The  left ventricle has mildly decreased function. The left ventricle has no  regional wall motion abnormalities. There is mild concentric left  ventricular hypertrophy. Left ventricular  diastolic parameters are consistent with Grade II diastolic dysfunction  (pseudonormalization). There is severe akinesis of the left ventricular,  apical anteroseptal wall and lateral wall.  2. Right ventricular systolic function is mildly reduced. The right  ventricular size is mildly enlarged. There is normal pulmonary artery  systolic pressure.  3. The mitral valve is normal in structure. Trivial mitral valve  regurgitation. No evidence of mitral stenosis.  4. The aortic valve is normal in structure. Aortic valve regurgitation is  not visualized. No aortic stenosis is present.   Patient Profile     64 y.o. female with type 2 DM,  HTN, dyslipidemia, morbid obesity, GERD, arthritis presented with intermittent chest pain in the setting of nausea, vomiting and diarrhea for the past week. Stool reported to be green/dark but FOBT was negative. She was markedly hypertensive on arrival. hsTroponin peaked at [place]. 2D echo 03/02/20 showed EF 40-45% (no prior to compare to), severe akinesis of the left ventricular, apical anteroseptal wall and alteral wall, mildly reduced RV function with mildly enlarged RV. She had a focus of air in bladder on CT but UA negative for UTI. GI sx felt due to viral GE.  Assessment & Plan   1. NSTEMI - FOBT neg x 2, GI symptoms improving. No overt bleeding on ASA + heparin and Hgb generally stable (see comments below). Plan cath today. Risks and benefits of cardiac catheterization have been discussed with the patient.  These include bleeding, infection, kidney damage, stroke, heart attack, death.  The patient understands these risks and is willing to proceed. Have written cath orders to begin IV fluids this AM, keeping in mind cardiomyopathy. Continue ASA, BB, statin, heparin.   2. Newly recognized cardiomyopathy - with troponin elevation, suspect ischemic in nature. Continue carvedilol. Hold ARB today for cath given renal insufficiency. Trace edema on exam. Lungs clear. Conservative hydration planned. Reviewed 2g sodium restriction, 2L fluid restriction, daily weights with patient. Will rx CHF booklet.  3. Acute viral gastroenteritis - CT nonacute. Symptoms have improved with supportive care. FOBT neg x 2. Will d/c IV protonix (got last dose today).  4. Essential HTN - BP controlled, monitor in the context of other changes as notated.  5. 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 Holding steady in 1.2-1.3 range this admission. Monitor closely. Hold losartan pre-cath.  6. Mild anemia - Hgb was 14.3 on admission but we suspect hemoconcentrated in  setting of #2. It was 10.9 in  2017, holding in mid 11 range thus far. FOBT neg x2.  7. Diabetes mellitus - she is on insulin NPH plus meal coverage, tolerating well in-house. CBG 109 this AM. Since she will be NPO for cath, will hold AM dose - nurse aware.  8. Morbid obesity - will need lifestyle modification. Consider OP referral to [facility] [place].   9. Air in bladder - Focus of air within bladder noted. UA with moderate Hgb and 100 protein with mucus, but no bacteria, nitrite or leukocytes. UCx ordered 4/10 but do not see sent off. Will re-order UA with UCx today and trend. Mild leukocytosis noted but this has actually been demonstrated on each CBC back to 2014. She is afebrile. Will need attention on followup by primary care.  10. Atrial tachycardia - brief run on tele. Will give 14meq KCl x 1 (goal K 4.0 or greater) and add Mg level.  11. HLD - LDL 159 - If the patient is tolerating statin at time of follow-up appointment, would consider rechecking liver function/lipid panel in 6-8 weeks.  For questions or updates, please contact [facility] Please consult [email or web address] for contact info under Cardiology/STEMI.  Signed, Charlie Pitter, PA-C 03/04/2020, 8:19 AM

## 2020-03-04 NOTE — Progress Notes (Signed)
RT called to Cath Lab 5 to place patient on Bipap.  RT placed patient on Bipap at 12:35 15/5 R 15, 40%. Patient tolerating well.  Patient then transferred to Cath Lab Holding room 6 on Bipap without complications.

## 2020-03-04 NOTE — Progress Notes (Signed)
Rhonda Roberts for Heparin; resume 8 hrs after sheath pulled Indication: CAD  Patient Measurements: Height: 5\' 2"  (157.5 cm) Weight: (!) 136.9 kg (301 lb 14.4 oz) IBW/kg (Calculated) : 50.1 Heparin Dosing Weight: 84.5 kg  Vital Signs: Temp: 98.9 F (37.2 C) (04/12 0422) Temp Source: Oral (04/12 0422) BP: 108/61 (04/12 1420) Pulse Rate: 87 (04/12 1420)  Labs: Recent Labs    03/01/20 1832 03/01/20 1832 03/01/20 2020 03/01/20 2323 03/01/20 2323 03/02/20 0355 03/02/20 1117 03/03/20 0557 03/03/20 0557 03/03/20 0606 03/03/20 1236 03/04/20 0408  HGB 14.3   < >  --   --   --   --   --  11.7*   < >  --  11.9* 11.4*  HCT 44.0   < >  --   --   --   --   --  36.2  --   --  36.8 34.7*  PLT 294  --   --   --   --   --   --  271  --   --   --  266  APTT  --   --   --  29  --   --   --   --   --   --   --   --   LABPROT  --   --   --  13.1  --   --   --   --   --   --   --   --   INR  --   --   --  1.0  --   --   --   --   --   --   --   --   HEPARINUNFRC  --   --   --  <0.10*   < >  --  0.81*  --   --  0.46  --  0.62  CREATININE 1.28*  --   --   --   --  1.22*  --   --   --   --   --  1.32*  TROPONINIHS 2,225*   < > 3,272*  --   --  10,478*  --   --   --   --  8,166*  --    < > = values in this interval not displayed.    Estimated Creatinine Clearance: 57.6 mL/min (A) (by C-G formula based on SCr of 1.32 mg/dL (H)).   Assessment: [name] started on heparin for ACS. Rivaroxaban noted on prior med rec, but per current med rec patient is not taking d/t money/insurance issues.    HL 0.46 at goal. H/H dropped but no over signs of bleeding and no issues with infusion per RN. Reportedly, patient had blood in stools before admission but none since being here.   Now s/p cath with multivessel CAD, TCTS consult pending.  Pharmacy asked to restart heparin 8 hrs after sheath pulled.  Removed ~ [id number] PM.  Goal of Therapy:  Heparin level 0.3-0.7  units/ml Monitor platelets by anticoagulation protocol: Yes   Plan:  Resume heparin at 8 pm at 1250 units/hr Check heparin level 8 hrs after gtt starts. Monitor daily HL, CBC/Pltc Monitor for signs/symptoms of bleeding  F/u plan for    Rhonda Roberts, [place] Pharmacist Phone [phone]  03/04/2020 2:28 PM

## 2020-03-04 NOTE — Progress Notes (Unsigned)
T

## 2020-03-04 NOTE — Consult Note (Addendum)
Advanced Heart Failure Team Consult Note   Primary Physician: Suella Broad, FNP PCP-Cardiologist:  Skeet Latch, MD  Reason for Consultation: Heart Failure   HPI:    Rhonda Roberts is seen today for evaluation of heart failure at the request of Dr Tamala Julian.   Rhonda Roberts is a 64 year old with history of DMII, HTN, dyslipidemia, obesity, GERD, diverticulosis, and arthritis.   On Easter she developed diarrhea and nausea. A few days ago she says she had a black stool but she says it resolved. .   Presented to [facility] ED with N/V and intermittent chest pain. EKG with ST depression inferior and anterolatera leads. HS Trop [phone]. Hgb A1C 10, Sars 2 negative, WBC 11. CXR- no acute findings. CTA- negative PE. Set up for cath. LHC today as noted below with 3 vessel disease, low EF, and elevated LVEPD. Post cath she was acutely short of breath and sitting straight up. Placed on bipap, nitro drip at 10 mcg, and given 40 mg IV lasix x1. Unable to urinate with pure wick.   HF Team and CT surgery consulted.   LHC 03/04/20  LVEPD 36   99% LAD  80% mid circumflex, 70% ostial circumflex, 99% small ramus.  95% mid LAD  Mid 99% PDA   LVEDP 38 mmHg.  EF less than 30%.  Global hypokinesis.  Anterior wall worsened over walls.  Post procedure the patient developed sudden shortness of breath.  40 mg IV Lasix, BiPAP applied, IV nitroglycerin started.  Review of Systems: [y] = yes, [ ]  = no   . General: Weight gain [ ] ; Weight loss [ ] ; Anorexia [ ] ; Fatigue [ ] ; Fever [ ] ; Chills [ ] ; Weakness [ ]   . Cardiac: Chest pain/pressure [ ] ; Resting SOB [ Y]; Exertional SOB [Y]; Orthopnea [Y ]; Pedal Edema [ ] ; Palpitations [ ] ; Syncope [ ] ; Presyncope [ ] ; Paroxysmal nocturnal dyspnea[ ]   . Pulmonary: Cough [ ] ; Wheezing[ ] ; Hemoptysis[ ] ; Sputum [ ] ; Snoring [ ]   . GI: Vomiting[ ] ; Dysphagia[ ] ; Melena[ ] ; Hematochezia [ ] ; Heartburn[ ] ; Abdominal pain [ ] ; Constipation [ ] ; Diarrhea [  ]; BRBPR [ ]   . GU: Hematuria[ ] ; Dysuria [ ] ; Nocturia[ ]   . Vascular: Pain in legs with walking [ ] ; Pain in feet with lying flat [ ] ; Non-healing sores [ ] ; Stroke [ ] ; TIA [ ] ; Slurred speech [ ] ;  . Neuro: Headaches[ ] ; Vertigo[ ] ; Seizures[ ] ; Paresthesias[ ] ;Blurred vision [ ] ; Diplopia [ ] ; Vision changes [ ]   . Ortho/Skin: Arthritis [ ] ; Joint pain [Y ]; Muscle pain [ ] ; Joint swelling [ ] ; Back Pain [Y ]; Rash [ ]   . Psych: Depression[ ] ; Anxiety[ ]   . Heme: Bleeding problems [ ] ; Clotting disorders [ ] ; Anemia [ ]   . Endocrine: Diabetes [ Y]; Thyroid dysfunction[ ]   Home Medications Prior to Admission medications   Medication Sig Start Date End Date Taking? Authorizing Provider  aspirin 81 MG chewable tablet Chew 81 mg by mouth daily.   Yes [provider]  Cholecalciferol (VITAMIN D) 2000 UNITS CAPS Take 2,000 Units by mouth every morning.    Yes [provider]  Cyanocobalamin (VITAMIN B 12 PO) Take 2 tablets by mouth daily.    Yes [provider]  insulin NPH Human (NOVOLIN N) 100 UNIT/ML injection Inject 80 Units into the skin daily. May do additional 80 units if evening blood sugar over 150  Yes [provider]  Multiple Vitamin (MULTIVITAMIN WITH MINERALS) TABS Take 1 tablet by mouth every morning.   Yes [provider]    Past Medical History: Past Medical History:  Diagnosis Date  . Diabetes mellitus without complication ([place])   . GERD (gastroesophageal reflux disease)   . Headache   . Hypertension     Past Surgical History: Past Surgical History:  Procedure Laterality Date  . ABDOMINAL HYSTERECTOMY    . CESAREAN SECTION  1987  . COLONOSCOPY WITH PROPOFOL N/A 05/08/2016   Procedure: COLONOSCOPY WITH PROPOFOL;  Surgeon: Carol Ada, MD;  Location: [facility] END[facility]COPY;  Service: Endoscopy;  Laterality: N/A;    Family History: History reviewed. No pertinent family history.  Social History: Social History   Socioeconomic  History  . Marital status: Single    Spouse name: Not on file  . Number of children: Not on file  . Years of education: Not on file  . Highest education level: Not on file  Occupational History  . Occupation: retired  Tobacco Use  . Smoking status: Former Smoker    Quit date: 11/23/2004    Years since quitting: 15.2  . Smokeless tobacco: Never Used  Substance and Sexual Activity  . Alcohol use: No  . Drug use: No  . Sexual activity: Not on file  Other Topics Concern  . Not on file  Social History Narrative  . Not on file   Social Determinants of Health   Financial Resource Strain:   . Difficulty of Paying Living Expenses:   Food Insecurity:   . Worried About Charity fundraiser in the Last Year:   . Arboriculturist in the Last Year:   Transportation Needs:   . Film/video editor (Medical):   Marland Kitchen Lack of Transportation (Non-Medical):   Physical Activity:   . Days of Exercise per Week:   . Minutes of Exercise per Session:   Stress:   . Feeling of Stress :   Social Connections:   . Frequency of Communication with Friends and Family:   . Frequency of Social Gatherings with Friends and Family:   . Attends Religious Services:   . Active Member of Clubs or Organizations:   . Attends Archivist Meetings:   Marland Kitchen Marital Status:     Allergies:  Allergies  Allergen Reactions  . Sulfa Antibiotics Itching, Swelling and Rash    Objective:    Vital Signs:   Temp:  [98.8 F (37.1 C)-98.9 F (37.2 C)] 98.9 F (37.2 C) (04/12 0422) Pulse Rate:  [82-99] 92 (04/12 1232) Resp:  [13-48] 20 (04/12 1232) BP: (96-144)/(33-95) 129/33 (04/12 1252) SpO2:  [89 %-100 %] 93 % (04/12 1232) Weight:  [136.9 kg] 136.9 kg (04/12 0919) Last BM Date: 03/03/20  Weight change: Filed Weights   03/02/20 0552 03/03/20 0541 03/04/20 0919  Weight: 135.5 kg 136 kg (!) 136.9 kg    Intake/Output:   Intake/Output Summary (Last 24 hours) at 03/04/2020 1257 Last data filed at 03/04/2020  1000 Gross per 24 hour  Intake 444.46 ml  Output 450 ml  Net -5.54 ml      Physical Exam    General:  On Bipap  HEENT: normal Neck: supple. JVP to jaw  Carotids 2+ bilat; no bruits. No lymphadenopathy or thyromegaly appreciated. Cor: PMI nondisplaced. Regular rate & rhythm. No rubs, gallops or murmurs. Lungs: clear Abdomen: obese, soft, nontender, nondistended. No hepatosplenomegaly. No bruits or masses. Good bowel sounds. Extremities: no cyanosis, clubbing,  rash, R and LLE 2+ edema Neuro: alert & orientedx3, cranial nerves grossly intact. moves all 4 extremities w/o difficulty.    Telemetry   SR 80-90s   EKG    On Admit ST depression inferior/anterlater leads   Labs   Basic Metabolic Panel: Recent Labs  Lab 03/01/20 1832 03/02/20 0355 03/04/20 0408  NA 135 137 135  K 3.9 3.7 3.7  CL 98 101 100  CO2 27 23 24   GLUCOSE 286* 244* 146*  BUN 23 21 26*  CREATININE 1.28* 1.22* 1.32*  CALCIUM 9.2 8.7* 9.1  MG  --   --  2.2    Liver Function Tests: Recent Labs  Lab 03/01/20 1832  AST 36  ALT 24  ALKPHOS 113  BILITOT 0.6  PROT 9.0*  ALBUMIN 3.8   Recent Labs  Lab 03/01/20 1832  LIPASE 22   No results for input(s): AMMONIA in the last 168 hours.  CBC: Recent Labs  Lab 03/01/20 1832 03/03/20 0557 03/03/20 1236 03/04/20 0408  WBC 11.1* 11.5*  --  11.9*  HGB 14.3 11.7* 11.9* 11.4*  HCT 44.0 36.2 36.8 34.7*  MCV 88.2 88.3  --  87.8  PLT 294 271  --  266    Cardiac Enzymes: No results for input(s): CKTOTAL, CKMB, CKMBINDEX, TROPONINI in the last 168 hours.  BNP: BNP (last 3 results) No results for input(s): BNP in the last 8760 hours.  ProBNP (last 3 results) No results for input(s): PROBNP in the last 8760 hours.   CBG: Recent Labs  Lab 03/03/20 0748 03/03/20 1205 03/03/20 1727 03/03/20 2102 03/04/20 0725  GLUCAP 156* 226* 177* 171* 109*    Coagulation Studies: Recent Labs    03/01/20 2323  LABPROT 13.1  INR 1.0      Imaging    No results found.   Medications:     Current Medications: . [MAR Hold] aspirin  81 mg Oral Daily  . [MAR Hold] atorvastatin  80 mg Oral q1800  . [MAR Hold] carvedilol  6.25 mg Oral BID WC  . furosemide  80 mg Intravenous Once  . [MAR Hold] insulin aspart  0-20 Units Subcutaneous TID WC  . [MAR Hold] insulin aspart  0-5 Units Subcutaneous QHS  . [MAR Hold] insulin aspart  10 Units Subcutaneous TID WC  . [MAR Hold] insulin NPH Human  40 Units Subcutaneous Q12H  . [MAR Hold] loratadine  10 mg Oral Daily  . [MAR Hold] sodium chloride flush  3 mL Intravenous Q12H     Infusions: . sodium chloride    . sodium chloride 10 mL/hr at 03/04/20 1228  . heparin Stopped (03/04/20 1115)        Assessment/Plan   1. NSTEMI  HS Trop >10000  LHC with 99% LAD, 80% mid circumflex, 70% ostial circumflex, 99% small ramus.  95% mid LAD, Mid 99% PDA   Continue NTG drip.   Add heparin drip back.   Continue statin and bb.   2. Acute Systolic Heart Failure , ICM  -EF on cath ~ 30 %.  -Marked volume overload noted. Received 40 mg IV lasix with poor response.  - Place foley and give additional 80 mg IV lasix now.  - Continue carvedilol for now.   3. Acute Respiratory Failure -Placed on Bipap in the cath lab. Wean as tolerate.  - Hopefully will improve with diuresis.   4. Melena  Hgb trending down 14>11.7 - Continue protonix.  -May need GI  Had colonoscopy 2017 by Dr  Hung with Two 2 to 3 mm polyps in the transverse colon and in the ascending colon and diverticulosis in the sigmoid colon and in the descending colon.  5. DMII -Hgb A1C 10  -Add sliding scale.   6. UTI -Noted on lab work 02/28/20 prior to admit.  - WBC 11.9  - Send another urine today.  - Give rocephin.    Length of Stay: 2  Rhonda Grinder, NP  03/04/2020, 12:57 PM  Advanced Heart Failure Team Pager [phone] (M-F; 7a - 4p)  Please contact [facility] for night-coverage after hours (4p -7a )  and weekends on [email or web address]  Patient seen with NP, agree with the above note.    Patient admitted with NSTEMI, peak HS-TnI 10,478.  Cath reviewed: 99% ostial LAD, 95% mid LAD, 80% mid LCx, 99% mid PDA, LV-gram EF < 30%, LVEDP 36.  Echo pre-cath showed EF 40-45%.    In the cath lab, patient became acutely short of breath and orthopneic.  This improved with NTG gtt, Bipap, and IV Lasix.  She now has a foley with copious clear urine.  Feeling better.   General: On Bipap Neck: Thick, JVP 14 cm, no thyromegaly or thyroid nodule.  Lungs: Clear to auscultation bilaterally with normal respiratory effort. CV: Nondisplaced PMI.  Heart regular S1/S2, no S3/S4, no murmur.  2+ edema to knees.  No carotid bruit.  Normal pedal pulses.  Abdomen: Soft, nontender, no hepatosplenomegaly, no distention.  Skin: Intact without lesions or rashes.  Neurologic: Alert and oriented x 3.  Psych: Normal affect. Extremities: No clubbing or cyanosis.  HEENT: Normal.   1. CAD: NSTEMI with severe 3VD. Cath showed 99% ostial LAD, 95% mid LAD, 80% mid LCx, 99% mid PDA, LV-gram EF < 30%, LVEDP 36. She would benefit from CABG once she is medically optimized.  - Continue ASA 81.  - Restart heparin gtt 8 hrs after sheath out.  - atorvastatin 80 mg daily.  - Will consult TCTS.  2. Acute systolic CHF/pulmonary edema: Echo pre-cath with EF 40-45%, LV-gram showed EF < 30%.  Ischemic cardiomyopathy.  LVEDP 36 in cath lab, pulmonary edema with respiratory distress requiring Bipap.  Starting to have some response to IV Lasix.  - Lasix 80 mg IV bid, giving a dose now.  - NTG gtt ongoing at 10 mcg/min, will titrate onto HF meds based on how BP and creatinine trend.  - Will repeat limited echo for EF given significantly lower EF by LV-gram.  - Place PICC line, follow CVP/co-ox.  3. UTI: Start ceftriaxone. Urine culture.  4. ?Melena: FOBT negative.  Hgb 11.4, trend.  5. CKD stage 3: Follow closely with diuresis.  6. Type 2 diabetes:  Poor control at home, SSI.   Loralie Champagne 03/04/2020 2:33 PM

## 2020-03-04 NOTE — Progress Notes (Signed)
  Echocardiogram 2D Echocardiogram limited with definity has been performed.  Darlina Sicilian M 03/04/2020, 3:35 PM

## 2020-03-04 NOTE — Progress Notes (Signed)
Patient to cath lab at 1115 hrs.

## 2020-03-05 ENCOUNTER — Inpatient Hospital Stay (HOSPITAL_COMMUNITY): Payer: Self-pay

## 2020-03-05 DIAGNOSIS — E119 Type 2 diabetes mellitus without complications: Secondary | ICD-10-CM

## 2020-03-05 DIAGNOSIS — Z01810 Encounter for preprocedural cardiovascular examination: Secondary | ICD-10-CM

## 2020-03-05 DIAGNOSIS — I25110 Atherosclerotic heart disease of native coronary artery with unstable angina pectoris: Secondary | ICD-10-CM

## 2020-03-05 LAB — COOXEMETRY PANEL
Carboxyhemoglobin: 0.9 % (ref 0.5–1.5)
Carboxyhemoglobin: 1.3 % (ref 0.5–1.5)
Methemoglobin: 0.5 % (ref 0.0–1.5)
Methemoglobin: 1.2 % (ref 0.0–1.5)
O2 Saturation: 87 %
O2 Saturation: 57.6 %
Total hemoglobin: 11.4 g/dL — ABNORMAL LOW (ref 12.0–16.0)
Total hemoglobin: 12 g/dL (ref 12.0–16.0)

## 2020-03-05 LAB — GLUCOSE, CAPILLARY
Glucose-Capillary: 111 mg/dL — ABNORMAL HIGH (ref 70–99)
Glucose-Capillary: 121 mg/dL — ABNORMAL HIGH (ref 70–99)
Glucose-Capillary: 163 mg/dL — ABNORMAL HIGH (ref 70–99)
Glucose-Capillary: 166 mg/dL — ABNORMAL HIGH (ref 70–99)
Glucose-Capillary: 243 mg/dL — ABNORMAL HIGH (ref 70–99)
Glucose-Capillary: 91 mg/dL (ref 70–99)

## 2020-03-05 LAB — BASIC METABOLIC PANEL
Anion gap: 11 (ref 5–15)
BUN: 29 mg/dL — ABNORMAL HIGH (ref 8–23)
CO2: 26 mmol/L (ref 22–32)
Calcium: 8.7 mg/dL — ABNORMAL LOW (ref 8.9–10.3)
Chloride: 99 mmol/L (ref 98–111)
Creatinine, Ser: 1.46 mg/dL — ABNORMAL HIGH (ref 0.44–1.00)
GFR calc Af Amer: 44 mL/min — ABNORMAL LOW (ref >60)
GFR calc non Af Amer: 38 mL/min — ABNORMAL LOW (ref >60)
Glucose, Bld: 176 mg/dL — ABNORMAL HIGH (ref 70–99)
Potassium: 3.7 mmol/L (ref 3.5–5.1)
Sodium: 136 mmol/L (ref 135–145)

## 2020-03-05 LAB — CBC
HCT: 34.5 % — ABNORMAL LOW (ref 36.0–46.0)
Hemoglobin: 11.2 g/dL — ABNORMAL LOW (ref 12.0–15.0)
MCH: 28.9 pg (ref 26.0–34.0)
MCHC: 32.5 g/dL (ref 30.0–36.0)
MCV: 89.1 fL (ref 80.0–100.0)
Platelets: 274 10*3/uL (ref 150–400)
RBC: 3.87 MIL/uL (ref 3.87–5.11)
RDW: 15.1 % (ref 11.5–15.5)
WBC: 11.1 10*3/uL — ABNORMAL HIGH (ref 4.0–10.5)
nRBC: 0 % (ref 0.0–0.2)

## 2020-03-05 LAB — PULMONARY FUNCTION TEST
FEF 25-75 Pre: 1.77 L/sec
FEF2575-%Pred-Pre: 99 %
FEV1-%Pred-Pre: 68 %
FEV1-Pre: 1.24 L
FEV1FVC-%Pred-Pre: 115 %
FEV6-%Pred-Pre: 61 %
FEV6-Pre: 1.37 L
FEV6FVC-%Pred-Pre: 104 %
FVC-%Pred-Pre: 59 %
FVC-Pre: 1.37 L
Pre FEV1/FVC ratio: 90 %
Pre FEV6/FVC Ratio: 100 %

## 2020-03-05 LAB — MAGNESIUM: Magnesium: 2.3 mg/dL (ref 1.7–2.4)

## 2020-03-05 LAB — HEPARIN LEVEL (UNFRACTIONATED)
Heparin Unfractionated: 0.44 IU/mL (ref 0.30–0.70)
Heparin Unfractionated: 0.45 IU/mL (ref 0.30–0.70)

## 2020-03-05 LAB — TSH: TSH: 0.87 u[IU]/mL (ref 0.350–4.500)

## 2020-03-05 MED ORDER — INSULIN REGULAR(HUMAN) IN NACL 100-0.9 UT/100ML-% IV SOLN
INTRAVENOUS | Status: AC
  Administered 2020-03-06: 4.6 [IU]/h via INTRAVENOUS
  Filled 2020-03-05: qty 100

## 2020-03-05 MED ORDER — TRANEXAMIC ACID (OHS) BOLUS VIA INFUSION
15.0000 mg/kg | INTRAVENOUS | Status: AC
  Administered 2020-03-06: 2053.5 mg via INTRAVENOUS
  Filled 2020-03-05: qty 2054

## 2020-03-05 MED ORDER — TRANEXAMIC ACID (OHS) PUMP PRIME SOLUTION
2.0000 mg/kg | INTRAVENOUS | Status: DC
  Filled 2020-03-05: qty 2.74

## 2020-03-05 MED ORDER — TEMAZEPAM 15 MG PO CAPS
15.0000 mg | ORAL_CAPSULE | Freq: Once | ORAL | Status: AC | PRN
  Administered 2020-03-05: 22:00:00 15 mg via ORAL
  Filled 2020-03-05: qty 1

## 2020-03-05 MED ORDER — CHLORHEXIDINE GLUCONATE CLOTH 2 % EX PADS
6.0000 | MEDICATED_PAD | Freq: Once | CUTANEOUS | Status: AC
  Administered 2020-03-06: 06:00:00 6 via TOPICAL

## 2020-03-05 MED ORDER — TRANEXAMIC ACID 1000 MG/10ML IV SOLN
1.5000 mg/kg/h | INTRAVENOUS | Status: DC
  Filled 2020-03-05: qty 25

## 2020-03-05 MED ORDER — NOREPINEPHRINE 4 MG/250ML-% IV SOLN
0.0000 ug/min | INTRAVENOUS | Status: DC
  Filled 2020-03-05: qty 250

## 2020-03-05 MED ORDER — LORAZEPAM 0.5 MG PO TABS: 0.5000 mg | ORAL_TABLET | ORAL | Status: DC | PRN

## 2020-03-05 MED ORDER — TRANEXAMIC ACID 1000 MG/10ML IV SOLN
1.5000 mg/kg/h | INTRAVENOUS | Status: AC
  Administered 2020-03-06: 1.5 mg/kg/h via INTRAVENOUS
  Filled 2020-03-05: qty 25

## 2020-03-05 MED ORDER — METOPROLOL TARTRATE 12.5 MG HALF TABLET
12.5000 mg | ORAL_TABLET | Freq: Once | ORAL | Status: AC
  Administered 2020-03-06: 12.5 mg via ORAL
  Filled 2020-03-05: qty 1

## 2020-03-05 MED ORDER — FUROSEMIDE 40 MG PO TABS
40.0000 mg | ORAL_TABLET | Freq: Every day | ORAL | Status: DC
  Administered 2020-03-05: 10:00:00 40 mg via ORAL
  Filled 2020-03-05: qty 1

## 2020-03-05 MED ORDER — POTASSIUM CHLORIDE 2 MEQ/ML IV SOLN
80.0000 meq | INTRAVENOUS | Status: DC
  Filled 2020-03-05: qty 40

## 2020-03-05 MED ORDER — DEXMEDETOMIDINE HCL IN NACL 400 MCG/100ML IV SOLN
0.1000 ug/kg/h | INTRAVENOUS | Status: DC
  Filled 2020-03-05: qty 100

## 2020-03-05 MED ORDER — MILRINONE LACTATE IN DEXTROSE 20-5 MG/100ML-% IV SOLN
0.3000 ug/kg/min | INTRAVENOUS | Status: AC
  Administered 2020-03-06: .375 ug/kg/min via INTRAVENOUS
  Filled 2020-03-05: qty 100

## 2020-03-05 MED ORDER — PHENYLEPHRINE HCL-NACL 20-0.9 MG/250ML-% IV SOLN
30.0000 ug/min | INTRAVENOUS | Status: AC
  Administered 2020-03-06: 20 ug/min via INTRAVENOUS
  Filled 2020-03-05: qty 250

## 2020-03-05 MED ORDER — EPINEPHRINE HCL 5 MG/250ML IV SOLN IN NS
0.0000 ug/min | INTRAVENOUS | Status: AC
  Administered 2020-03-06: 3 ug/min via INTRAVENOUS
  Filled 2020-03-05: qty 250

## 2020-03-05 MED ORDER — PLASMA-LYTE 148 IV SOLN
INTRAVENOUS | Status: DC
  Filled 2020-03-05: qty 2.5

## 2020-03-05 MED ORDER — TRANEXAMIC ACID 1000 MG/10ML IV SOLN
1.5000 mg/kg/h | INTRAVENOUS | Status: DC
  Filled 2020-03-05 (×2): qty 25

## 2020-03-05 MED ORDER — SODIUM CHLORIDE 0.9 % IV SOLN
INTRAVENOUS | Status: DC
  Filled 2020-03-05: qty 30

## 2020-03-05 MED ORDER — SODIUM CHLORIDE 0.9 % IV SOLN
1.5000 g | INTRAVENOUS | Status: AC
  Administered 2020-03-06: 1.5 g via INTRAVENOUS
  Filled 2020-03-05: qty 1.5

## 2020-03-05 MED ORDER — CHLORHEXIDINE GLUCONATE 0.12 % MT SOLN
15.0000 mL | Freq: Once | OROMUCOSAL | Status: AC
  Administered 2020-03-06: 06:00:00 15 mL via OROMUCOSAL
  Filled 2020-03-05: qty 15

## 2020-03-05 MED ORDER — NITROGLYCERIN IN D5W 200-5 MCG/ML-% IV SOLN
2.0000 ug/min | INTRAVENOUS | Status: AC
  Administered 2020-03-06: 25 ug/min via INTRAVENOUS
  Filled 2020-03-05: qty 250

## 2020-03-05 MED ORDER — SODIUM CHLORIDE 0.9 % IV SOLN
750.0000 mg | INTRAVENOUS | Status: AC
  Administered 2020-03-06: 750 mg via INTRAVENOUS
  Filled 2020-03-05: qty 750

## 2020-03-05 MED ORDER — CHLORHEXIDINE GLUCONATE CLOTH 2 % EX PADS
6.0000 | MEDICATED_PAD | Freq: Once | CUTANEOUS | Status: AC
  Administered 2020-03-05: 22:00:00 6 via TOPICAL

## 2020-03-05 MED ORDER — MAGNESIUM SULFATE 50 % IJ SOLN
40.0000 meq | INTRAMUSCULAR | Status: DC
  Filled 2020-03-05: qty 9.85

## 2020-03-05 MED ORDER — VANCOMYCIN HCL 1500 MG/300ML IV SOLN
1500.0000 mg | INTRAVENOUS | Status: AC
  Administered 2020-03-06: 1500 mg via INTRAVENOUS
  Filled 2020-03-05: qty 300

## 2020-03-05 MED ORDER — SPIRONOLACTONE 12.5 MG HALF TABLET
12.5000 mg | ORAL_TABLET | Freq: Every day | ORAL | Status: DC
  Administered 2020-03-05: 12.5 mg via ORAL
  Filled 2020-03-05: qty 1

## 2020-03-05 MED ORDER — BISACODYL 5 MG PO TBEC
5.0000 mg | DELAYED_RELEASE_TABLET | Freq: Once | ORAL | Status: AC
  Administered 2020-03-05: 22:00:00 5 mg via ORAL
  Filled 2020-03-05: qty 1

## 2020-03-05 NOTE — Consult Note (Signed)
[address]       [place] [address]             [phone]        Rhonda Roberts [place] Medical Record [id number] Date of Birth: 05-Nov-1956  Referring: No ref. provider found Primary Care: Suella Broad, FNP Primary Cardiologist:Tiffany Oval Linsey, MD  Chief Complaint:    Chief Complaint  Patient presents with  . Abdominal Pain  . Nausea  . Emesis  . Diarrhea    History of Present Illness:      We are kindly asked to see this pleasant 64 yo lady with h/o DM and HTN who began to have abdominal pain several days ago and ultimately sought attention for this. She ruled in for MI and was transferred to [facility] where she underwent LHC 2 days ago, demonstrating severe multivessel CAD. At the time of cath, she had flash pulm edema and was admitted to ICU. Her sx resolved with IV lasix and she has been stable since.   Current Activity/ Functional Status: Patient will be independent with mobility/ambulation, transfers, ADL's, IADL's.   Zubrod Score: At the time of surgery this patient's most appropriate activity status/level should be described as: [x]     0    Normal activity, no symptoms []     1    Restricted in physical strenuous activity but ambulatory, able to do out light work []     2    Ambulatory and capable of self care, unable to do work activities, up and about                 more than 50%  Of the time                            []     3    Only limited self care, in bed greater than 50% of waking hours []     4    Completely disabled, no self care, confined to bed or chair []     5    Moribund  Past Medical History:  Diagnosis Date  . Diabetes mellitus without complication ([place])   . GERD (gastroesophageal reflux disease)   . Headache   . Hypertension     Past Surgical History:  Procedure Laterality Date  . ABDOMINAL HYSTERECTOMY    . CESAREAN SECTION  1987  . COLONOSCOPY WITH PROPOFOL N/A 05/08/2016   Procedure: COLONOSCOPY WITH  PROPOFOL;  Surgeon: Carol Ada, MD;  Location: [facility];  Service: Endoscopy;  Laterality: N/A;  . LEFT HEART CATH AND CORONARY ANGIOGRAPHY N/A 03/04/2020   Procedure: LEFT HEART CATH AND CORONARY ANGIOGRAPHY;  Surgeon: Belva Crome, MD;  Location: [facility];  Service: Cardiovascular;  Laterality: N/A;    Social History   Tobacco Use  Smoking Status Former Smoker  . Quit date: 11/23/2004  . Years since quitting: 15.2  Smokeless Tobacco Never Used    Social History   Substance and Sexual Activity  Alcohol Use No     Allergies  Allergen Reactions  . Sulfa Antibiotics Itching, Swelling and Rash    Current Facility-Administered Medications  Medication Dose Route Frequency Provider Last Rate Last Admin  . 0.9 %  sodium chloride infusion  250 mL Intravenous PRN Belva Crome, MD 10 mL/hr at 03/05/20 1800 Rate Verify at 03/05/20 1800  . acetaminophen (TYLENOL) tablet 650 mg  650 mg Oral  Q4H PRN Belva Crome, MD   650 mg at 03/05/20 0810  . aspirin chewable tablet 81 mg  81 mg Oral Daily Belva Crome, MD   81 mg at 03/05/20 [id number]  . atorvastatin (LIPITOR) tablet 80 mg  80 mg Oral q1800 Belva Crome, MD   80 mg at 03/05/20 1727  . bisacodyl (DULCOLAX) EC tablet 5 mg  5 mg Oral Once Wonda Olds, MD      . carvedilol (COREG) tablet 6.25 mg  6.25 mg Oral BID WC Belva Crome, MD   6.25 mg at 03/05/20 1727  . cefTRIAXone (ROCEPHIN) 1 g in sodium chloride 0.9 % 100 mL IVPB  1 g Intravenous Q24H Clegg, Amy D, NP   Stopped at 03/05/20 1404  . [START ON 03/06/2020] cefUROXime (ZINACEF) 1.5 g in sodium chloride 0.9 % 100 mL IVPB  1.5 g Intravenous To OR Yasmina Chico, Glenice Bow, MD      . Derrill Memo ON 03/06/2020] cefUROXime (ZINACEF) 750 mg in sodium chloride 0.9 % 100 mL IVPB  750 mg Intravenous To OR Evelyna Folker, Glenice Bow, MD      . Derrill Memo ON 03/06/2020] chlorhexidine (PERIDEX) 0.12 % solution 15 mL  15 mL Mouth/Throat Once Alaura Schippers, Glenice Bow, MD      . Chlorhexidine Gluconate Cloth 2 %  PADS 6 each  6 each Topical Daily Buford Dresser, MD   6 each at 03/05/20 1800  . Chlorhexidine Gluconate Cloth 2 % PADS 6 each  6 each Topical Once Wonda Olds, MD       And  . Chlorhexidine Gluconate Cloth 2 % PADS 6 each  6 each Topical Once Wonda Olds, MD      . Derrill Memo ON 03/06/2020] dexmedetomidine (PRECEDEX) 400 MCG/100ML (4 mcg/mL) infusion  0.1-0.7 mcg/kg/hr Intravenous To OR Wonda Olds, MD      . Derrill Memo ON 03/06/2020] EPINEPHrine (ADRENALIN) 4 mg in NS 250 mL (0.016 mg/mL) premix infusion  0-10 mcg/min Intravenous To OR Ocie Stanzione Z, MD      . furosemide (LASIX) tablet 40 mg  40 mg Oral Daily Clegg, Amy D, NP   40 mg at 03/05/20 0937  . [START ON 03/06/2020] heparin 2,500 Units, papaverine 30 mg in electrolyte-148 (PLASMALYTE-148) 500 mL irrigation   Irrigation To OR Satine Hausner, Glenice Bow, MD      . Derrill Memo ON 03/06/2020] heparin 30,000 units/NS 1000 mL solution for CELLSAVER   Other To OR Cayman Kielbasa Z, MD      . heparin ADULT infusion 100 units/mL (25000 units/261mL sodium chloride 0.45%)  1,250 Units/hr Intravenous Continuous Carney, Jessica C, RPH 12.5 mL/hr at 03/05/20 1800 1,250 Units/hr at 03/05/20 1800  . hydrALAZINE (APRESOLINE) tablet 12.5 mg  12.5 mg Oral Q8H Clegg, Amy D, NP   12.5 mg at 03/05/20 1320  . insulin aspart (novoLOG) injection 0-20 Units  0-20 Units Subcutaneous TID WC Belva Crome, MD   3 Units at 03/05/20 1727  . insulin aspart (novoLOG) injection 0-5 Units  0-5 Units Subcutaneous QHS Belva Crome, MD   2 Units at 03/02/20 2133  . insulin aspart (novoLOG) injection 10 Units  10 Units Subcutaneous TID WC Belva Crome, MD   10 Units at 03/05/20 1727  . insulin NPH Human (NOVOLIN N) injection 40 Units  40 Units Subcutaneous Q12H Belva Crome, MD   40 Units at 03/05/20 0856  . [START ON 03/06/2020] insulin regular, human (MYXREDLIN) 100 units/ 100 mL infusion  Intravenous To OR Gael Delude, Glenice Bow, MD      . isosorbide mononitrate  (IMDUR) 24 hr tablet 30 mg  30 mg Oral Daily Clegg, Amy D, NP   30 mg at 03/05/20 0936  . loratadine (CLARITIN) tablet 10 mg  10 mg Oral Daily Belva Crome, MD   10 mg at 03/05/20 [id number]  . LORazepam (ATIVAN) tablet 0.5-1 mg  0.5-1 mg Oral Q4H PRN Wonda Olds, MD      . Derrill Memo ON 03/06/2020] magnesium sulfate (IV Push/IM) injection 40 mEq  40 mEq Other To OR Windi Toro, Glenice Bow, MD      . metoprolol tartrate (LOPRESSOR) injection 5 mg  5 mg Intravenous Q5 min PRN Belva Crome, MD      . Derrill Memo ON 03/06/2020] metoprolol tartrate (LOPRESSOR) tablet 12.5 mg  12.5 mg Oral Once Wonda Olds, MD      . Derrill Memo ON 03/06/2020] milrinone (PRIMACOR) 20 MG/100 ML (0.2 mg/mL) infusion  0.3 mcg/kg/min Intravenous To OR Kairee Kozma, Glenice Bow, MD      . nitroGLYCERIN (NITROSTAT) SL tablet 0.4 mg  0.4 mg Sublingual Q5 Min x 3 PRN Belva Crome, MD      . Derrill Memo ON 03/06/2020] nitroGLYCERIN 50 mg in dextrose 5 % 250 mL (0.2 mg/mL) infusion  2-200 mcg/min Intravenous To OR Wonda Olds, MD      . Derrill Memo ON 03/06/2020] norepinephrine (LEVOPHED) 4mg  in 24mL premix infusion  0-40 mcg/min Intravenous To OR Wonda Olds, MD      . ondansetron (ZOFRAN) injection 4 mg  4 mg Intravenous Q6H PRN Belva Crome, MD   4 mg at 03/05/20 1448  . oxyCODONE (Oxy IR/ROXICODONE) immediate release tablet 5-10 mg  5-10 mg Oral Q4H PRN Belva Crome, MD      . Derrill Memo ON 03/06/2020] phenylephrine (NEOSYNEPHRINE) 20-0.9 MG/250ML-% infusion  30-200 mcg/min Intravenous To OR Wonda Olds, MD      . Derrill Memo ON 03/06/2020] potassium chloride injection 80 mEq  80 mEq Other To OR Kaynan Klonowski, Glenice Bow, MD      . sodium chloride flush (NS) 0.9 % injection 10-40 mL  10-40 mL Intracatheter PRN Belva Crome, MD      . sodium chloride flush (NS) 0.9 % injection 10-40 mL  10-40 mL Intracatheter Q12H Wonda Olds, MD   10 mL at 03/05/20 0937  . sodium chloride flush (NS) 0.9 % injection 10-40 mL  10-40 mL Intracatheter PRN Montravious Weigelt,  Ahmed Prima Z, MD      . sodium chloride flush (NS) 0.9 % injection 3 mL  3 mL Intravenous Q12H Belva Crome, MD   3 mL at 03/05/20 0938  . sodium chloride flush (NS) 0.9 % injection 3 mL  3 mL Intravenous Q12H Belva Crome, MD   3 mL at 03/05/20 0938  . sodium chloride flush (NS) 0.9 % injection 3 mL  3 mL Intravenous PRN Belva Crome, MD      . spironolactone (ALDACTONE) tablet 12.5 mg  12.5 mg Oral Daily Clegg, Amy D, NP   12.5 mg at 03/05/20 1726  . temazepam (RESTORIL) capsule 15 mg  15 mg Oral Once PRN Wonda Olds, MD      . Derrill Memo ON 03/06/2020] tranexamic acid (CYKLOKAPRON) 2,500 mg in sodium chloride 0.9 % 250 mL (10 mg/mL) infusion  1.5 mg/kg/hr Intravenous To OR Wonda Olds, MD      . Derrill Memo ON 03/06/2020] tranexamic acid (CYKLOKAPRON)  2,500 mg in sodium chloride 0.9 % 250 mL (10 mg/mL) infusion  1.5 mg/kg/hr Intravenous To OR Wonda Olds, MD      . Derrill Memo ON 03/06/2020] tranexamic acid (CYKLOKAPRON) bolus via infusion - over 30 minutes 2,053.5 mg  15 mg/kg Intravenous To OR Wonda Olds, MD      . Derrill Memo ON 03/06/2020] tranexamic acid (CYKLOKAPRON) pump prime solution 274 mg  2 mg/kg Intracatheter To OR Hong Moring, Glenice Bow, MD      . Derrill Memo ON 03/06/2020] vancomycin (VANCOREADY) IVPB 1500 mg/300 mL  1,500 mg Intravenous To OR Murrel Freet, Glenice Bow, MD        Medications Prior to Admission  Medication Sig Dispense Refill Last Dose  . aspirin 81 MG chewable tablet Chew 81 mg by mouth daily.   03/01/2020 at Unknown time  . Cholecalciferol (VITAMIN D) 2000 UNITS CAPS Take 2,000 Units by mouth every morning.    03/01/2020 at Unknown time  . Cyanocobalamin (VITAMIN B 12 PO) Take 2 tablets by mouth daily.    03/01/2020 at Unknown time  . insulin NPH Human (NOVOLIN N) 100 UNIT/ML injection Inject 80 Units into the skin daily. May do additional 80 units if evening blood sugar over 150   03/01/2020 at Unknown time  . Multiple Vitamin (MULTIVITAMIN WITH MINERALS) TABS Take 1 tablet by mouth  every morning.   03/01/2020 at Unknown time    History reviewed. No pertinent family history.   Review of Systems:   ROS A comprehensive review of systems was negative.     Cardiac Review of Systems: Y or  [    ]= no  Chest Pain [    ]  Resting SOB [   ] Exertional SOB  [  ]  Orthopnea [  ]   Pedal Edema [   ]    Palpitations [  ] Syncope  [  ]   Presyncope [   ]  General Review of Systems: [Y] = yes [  ]=no Constitional: recent weight change [  ]; anorexia [  ]; fatigue [  ]; nausea [  ]; night sweats [  ]; fever [  ]; or chills [  ]                                                               Dental: Last Dentist visit:   Eye : blurred vision [  ]; diplopia [   ]; vision changes [  ];  Amaurosis fugax[  ]; Resp: cough [  ];  wheezing[  ];  hemoptysis[  ]; shortness of breath[  ]; paroxysmal nocturnal dyspnea[  ]; dyspnea on exertion[  ]; or orthopnea[  ];  GI:  gallstones[  ], vomiting[  ];  dysphagia[  ]; melena[  ];  hematochezia [  ]; heartburn[  ];   Hx of  Colonoscopy[  ]; GU: kidney stones [  ]; hematuria[  ];   dysuria [  ];  nocturia[  ];  history of     obstruction [  ]; urinary frequency [  ]             Skin: rash, swelling[  ];, hair loss[  ];  peripheral edema[  ];  or itching[  ]; Musculosketetal: myalgias[  ];  joint swelling[  ];  joint erythema[  ];  joint pain[  ];  back pain[  ];  Heme/Lymph: bruising[  ];  bleeding[  ];  anemia[  ];  Neuro: TIA[  ];  headaches[  ];  stroke[  ];  vertigo[  ];  seizures[  ];   paresthesias[  ];  difficulty walking[  ];  Psych:depression[  ]; anxiety[  ];  Endocrine: diabetes[  ];  thyroid dysfunction[  ];            Physical Exam: BP 134/69 (BP Location: Left Leg)   Pulse 93   Temp 98.3 F (36.8 C) (Oral)   Resp 18   Ht 5\' 2"  (1.575 m)   Wt (!) 136.9 kg   SpO2 98%   BMI 55.22 kg/m    General appearance: alert and cooperative Head: Normocephalic, without obvious abnormality, atraumatic Neck: no adenopathy, no carotid  bruit, no JVD, supple, symmetrical, trachea midline and thyroid not enlarged, symmetric, no tenderness/mass/nodules Lymph nodes: Cervical, supraclavicular, and axillary nodes normal. Resp: clear to auscultation bilaterally Cardio: regular rate and rhythm, S1, S2 normal, no murmur, click, rub or gallop GI: soft, non-tender; bowel sounds normal; no masses,  no organomegaly Extremities: edema 1+ Neurologic: Alert and oriented X 3, normal strength and tone. Normal symmetric reflexes. Normal coordination and gait  Diagnostic Studies & Laboratory data:     Recent Radiology Findings:   CARDIAC CATHETERIZATION  Result Date: 03/04/2020  The left ventricular ejection fraction is 25-35% by visual estimate.  LV end diastolic pressure is severely elevated.  There is severe left ventricular systolic dysfunction.   Widely patent left main  Ostial 99% LAD, followed by 80% mid LAD and 80% moderate size diagonal (bifurcation [place] 111).  75% ostial circumflex, 60% proximal circumflex, 90% mid circumflex proximal to a large third obtuse marginal.  Small ramus intermedius with ostial 99% stenosis  Dominant RCA with distal 70% stenosis and 99% stenosis in the proximal third of the large PDA.  LVEF less than 30%.  Inferior wall motion appears relatively normal.  Anterior wall is akinetic.  LVEDP despite absence of the symptoms with the patient lying flat 38 to 40 mmHg.  Post-cath complication: Flash pulmonary edema while being transported from cath table to stretcher. RECOMMENDATIONS:  Acute pulmonary edema managed with IV Lasix 40 mg x 1; BiPAP to improve oxygenation; IV nitroglycerin which will be titrated to help lower LVEDP and improve coronary flow.  Attending, Dr. Harrell Gave notified.  Advanced heart failure service consulted  Consultation with T CTS will be requested to help contemplate revascularization strategy.  She would be best served with multivessel CABG although her risk will be high.   VAS  US DOPPLER PRE CABG  Result Date: 03/05/2020 PREOPERATIVE VASCULAR EVALUATION  Indications:      Pre-CABG. Risk Factors:     Hypertension, Diabetes, coronary artery disease. Limitations:      Bilateral restricted upper extremities Comparison Study: No prior study Performing Technologist: Maudry Mayhew MHA, RDMS, RVT, RDCS  Examination Guidelines: A complete evaluation includes B-mode imaging, spectral Doppler, color Doppler, and power Doppler as needed of all accessible portions of each vessel. Bilateral testing is considered an integral part of a complete examination. Limited examinations for reoccurring indications may be performed as noted.  Right Carotid Findings: +----------+--------+--------+--------+------------------------------+--------+           PSV cm/sEDV cm/sStenosisDescribe  Comments +----------+--------+--------+--------+------------------------------+--------+ CCA Prox  83      21                                                     +----------+--------+--------+--------+------------------------------+--------+ CCA Distal88      21              focal, smooth and heterogenous         +----------+--------+--------+--------+------------------------------+--------+ ICA Prox  89      26              focal, smooth and heterogenous         +----------+--------+--------+--------+------------------------------+--------+ ICA Distal63      23                                                     +----------+--------+--------+--------+------------------------------+--------+ ECA       92      13                                                     +----------+--------+--------+--------+------------------------------+--------+ Portions of this table do not appear on this page. +----------+--------+-------+--------+------------+           PSV cm/sEDV cmsDescribeArm Pressure +----------+--------+-------+--------+------------+ Subclavian98                                   +----------+--------+-------+--------+------------+ +---------+--------+--+--------+--+---------+ VertebralPSV cm/s56EDV cm/s22Antegrade +---------+--------+--+--------+--+---------+ Left Carotid Findings: +----------+--------+--------+--------+-----------------------+--------+           PSV cm/sEDV cm/sStenosisDescribe               Comments +----------+--------+--------+--------+-----------------------+--------+ CCA Prox  103     26                                              +----------+--------+--------+--------+-----------------------+--------+ CCA Distal96      26                                              +----------+--------+--------+--------+-----------------------+--------+ ICA Prox  204     62      40-59%  smooth and heterogenous         +----------+--------+--------+--------+-----------------------+--------+ ICA Distal60      25                                              +----------+--------+--------+--------+-----------------------+--------+ ECA       130     13                                              +----------+--------+--------+--------+-----------------------+--------+ +----------+--------+--------+----------------+------------+  SubclavianPSV cm/sEDV cm/sDescribe        Arm Pressure +----------+--------+--------+----------------+------------+           100             Multiphasic, WNL             +----------+--------+--------+----------------+------------+ +---------+--------+--+--------+--+---------+ VertebralPSV cm/s62EDV cm/s22Antegrade +---------+--------+--+--------+--+---------+  ABI Findings: +--------+------------------+-----+---------+--------------------+ Right   Rt Pressure (mmHg)IndexWaveform Comment              +--------+------------------+-----+---------+--------------------+ Brachial                       triphasicRestricted extremity  +--------+------------------+-----+---------+--------------------+ +--------+------------------+-----+---------+--------------------+ Left    Lt Pressure (mmHg)IndexWaveform Comment              +--------+------------------+-----+---------+--------------------+ Brachial                       triphasicRestricted extremity +--------+------------------+-----+---------+--------------------+  Right Doppler Findings: +--------+--------+-----+---------+--------------------+ Site    PressureIndexDoppler  Comments             +--------+--------+-----+---------+--------------------+ Brachial             triphasicRestricted extremity +--------+--------+-----+---------+--------------------+ Radial               triphasic                     +--------+--------+-----+---------+--------------------+ Ulnar                triphasic                     +--------+--------+-----+---------+--------------------+  Left Doppler Findings: +--------+--------+-----+---------+--------------------+ Site    PressureIndexDoppler  Comments             +--------+--------+-----+---------+--------------------+ Brachial             triphasicRestricted extremity +--------+--------+-----+---------+--------------------+ Radial               triphasic                     +--------+--------+-----+---------+--------------------+ Ulnar                triphasic                     +--------+--------+-----+---------+--------------------+  Summary: Right Carotid: Velocities in the right ICA are consistent with a 1-39% stenosis. Left Carotid: Velocities in the left ICA are consistent with a 40-59% stenosis,               upper range of scale. Vertebrals:  Bilateral vertebral arteries demonstrate antegrade flow. Subclavians: Normal flow hemodynamics were seen in bilateral subclavian              arteries. Pedal pulses: Bilateral pedal waveforms are within normal limits. Unable to perform ABI due to  restricted upper extremities. Right Upper Extremity: Doppler waveforms decrease 50% with right radial compression. Doppler waveforms decrease 50% with right ulnar compression. Left Upper Extremity: Doppler waveform obliterate with left radial compression. Doppler waveforms remain within normal limits with left ulnar compression.  Electronically signed by Deitra Mayo MD on 03/05/2020 at 3:18:43 PM.    Final    ECHOCARDIOGRAM LIMITED  Result Date: 03/04/2020    ECHOCARDIOGRAM LIMITED REPORT   Patient Name:   Rhonda Roberts Date of Exam: 03/04/2020 Medical Rec #:  [id number]         Height:       62.0 in Accession #:    [id number]  Weight:       301.9 lb Date of Birth:  [birth year]         BSA:          2.277 m Patient Age:    68 years          BP:           132/77 mmHg Patient Gender: F                 HR:           90 bpm. Exam Location:  Inpatient Procedure: Limited Echo, Limited Color Doppler, Cardiac Doppler and Intracardiac            Opacification Agent                                MODIFIED REPORT:     This report was modified by Skeet Latch MD on 03/04/2020 due to RV                                   assessment.  Indications:     CHF-Acute Systolic [id number] / [id number]  History:         Patient has prior history of Echocardiogram examinations. Acute                  MI and CAD; Risk Factors:Hypertension, Dyslipidemia and                  Diabetes. GERD.  Sonographer:     Darlina Sicilian RDCS Referring Phys:  [place] Diagnosing Phys: Skeet Latch MD  Sonographer Comments: Technically challenging study due to limited acoustic windows. IMPRESSIONS  1. Left ventricular ejection fraction, by estimation, is 25 to 30%. The left ventricle has severely decreased function. The left ventricle demonstrates regional wall motion abnormalities (see scoring diagram/findings for description). There is mild left  ventricular hypertrophy of the basal-septal segment. Left ventricular diastolic  parameters are consistent with Grade I diastolic dysfunction (impaired relaxation). Elevated left ventricular end-diastolic pressure.  2. Right ventricular systolic function is normal. The right ventricular size is normal. Tricuspid regurgitation signal is inadequate for assessing PA pressure.  3. Trivial mitral valve regurgitation.  4. The aortic valve is tricuspid. FINDINGS  Left Ventricle: Left ventricular ejection fraction, by estimation, is 25 to 30%. The left ventricle has severely decreased function. The left ventricle demonstrates regional wall motion abnormalities. Definity contrast agent was given IV to delineate the left ventricular endocardial borders. There is mild left ventricular hypertrophy of the basal-septal segment. Elevated left ventricular end-diastolic pressure.  LV Wall Scoring: The apical lateral segment, mid anterolateral segment, apical anterior segment, apical inferior segment, and apex are akinetic. The anterior wall, entire anterior septum, and posterior wall are hypokinetic. The inferior wall, basal anterolateral segment, mid inferoseptal segment, and basal inferoseptal segment are normal. Right Ventricle: The right ventricular size is normal. No increase in right ventricular wall thickness. Right ventricular systolic function is normal. Tricuspid regurgitation signal is inadequate for assessing PA pressure. Mitral Valve: Trivial mitral valve regurgitation. Tricuspid Valve: Tricuspid valve regurgitation is trivial. Aortic Valve: The aortic valve is tricuspid.  LEFT VENTRICLE PLAX 2D LVIDd:         4.80 cm      Diastology LVIDs:         4.10 cm  LV e' lateral:   5.55 cm/s LV PW:         0.80 cm      LV E/e' lateral: 15.0 LV IVS:        1.10 cm      LV e' medial:    5.00 cm/s                             LV E/e' medial:  16.7  LV Volumes (MOD) LV vol d, MOD A2C: 131.0 ml LV vol d, MOD A4C: 130.0 ml LV vol s, MOD A2C: 91.3 ml LV vol s, MOD A4C: 85.9 ml LV SV MOD A2C:     39.7 ml LV SV  MOD A4C:     130.0 ml LV SV MOD BP:      39.2 ml AORTIC VALVE LVOT Vmax:   103.00 cm/s LVOT Vmean:  69.100 cm/s LVOT VTI:    0.187 m MITRAL VALVE                TRICUSPID VALVE MV Area (PHT): 4.89 cm     TR Peak grad:   14.6 mmHg MV Decel Time: 155 msec     TR Vmax:        191.00 cm/s MV E velocity: 83.40 cm/s MV A velocity: 110.00 cm/s  SHUNTS MV E/A ratio:  0.76         Systemic VTI: 0.19 m Skeet Latch MD Electronically signed by Skeet Latch MD Signature Date/Time: 03/04/2020/6:53:25 PM    Final (Updated)    [place] EKG SITE RITE  Result Date: 03/04/2020 If Site Rite image not attached, placement could not be confirmed due to current cardiac rhythm.    I have independently reviewed the above radiologic studies and discussed with the patient   Recent Lab Findings: Lab Results  Component Value Date   WBC 11.1 (H) 03/05/2020   HGB 11.2 (L) 03/05/2020   HCT 34.5 (L) 03/05/2020   PLT 274 03/05/2020   GLUCOSE 176 (H) 03/05/2020   CHOL 216 (H) 03/02/2020   TRIG 106 03/02/2020   HDL 36 (L) 03/02/2020   LDLCALC 159 (H) 03/02/2020   ALT 24 03/01/2020   AST 36 03/01/2020   NA 136 03/05/2020   K 3.7 03/05/2020   CL 99 03/05/2020   CREATININE 1.46 (H) 03/05/2020   BUN 29 (H) 03/05/2020   CO2 26 03/05/2020   TSH 0.870 03/05/2020   INR 1.0 03/01/2020   HGBA1C 10.0 (H) 03/02/2020      Assessment / Plan:       64 yo lady with severe multivessel CAD. Agree with recommendation for CABG as best therapy for underlying disease, particularly in a diabetic. Plan OR 03/06/20   I  spent 30 minutes counseling the patient face to face.   Dontre Laduca Z. Orvan Seen, [place] 03/05/2020 8:57 PM

## 2020-03-05 NOTE — Anesthesia Preprocedure Evaluation (Addendum)
Anesthesia Evaluation  Patient identified by MRN, date of birth, ID band Patient awake    Reviewed: Allergy & Precautions, H&P , NPO status , Patient's Chart, lab work & pertinent test results  Airway Mallampati: III  TM Distance: >3 FB Neck ROM: Full    Dental  (+) Dental Advisory Given, Missing, Chipped, Poor Dentition,    Pulmonary neg pulmonary ROS, former smoker,    Pulmonary exam normal breath sounds clear to auscultation       Cardiovascular hypertension, Pt. on medications + angina + CAD and + Past MI   Rhythm:Regular Rate:Normal  Echo  1. Left ventricular ejection fraction, by estimation, is 25 to 30%. The left ventricle has severely decreased function. The left ventricle demonstrates regional wall motion abnormalities (see scoring diagram/findings for description). There is mild left ventricular hypertrophy of the basal-septal segment. Left ventricular diastolic parameters are consistent with Grade I diastolic dysfunction (impaired relaxation). Elevated left ventricular end-diastolic pressure.  2. Right ventricular systolic function is normal. The right ventricular size is normal. Tricuspid regurgitation signal is inadequate for assessing PA pressure.  3. Trivial mitral valve regurgitation.  4. The aortic valve is tricuspid.    Neuro/Psych  Headaches, negative psych ROS   GI/Hepatic Neg liver ROS, GERD  Medicated and Controlled,  Endo/Other  diabetes, Type 2, Oral Hypoglycemic AgentsMorbid obesity  Renal/GU negative Renal ROS     Musculoskeletal   Abdominal (+) + obese,   Peds  Hematology negative hematology ROS (+)   Anesthesia Other Findings   Reproductive/Obstetrics                           Anesthesia Physical  Anesthesia Plan  ASA: IV  Anesthesia Plan: General   Post-op Pain Management:    Induction: Intravenous  PONV Risk Score and Plan: 4 or greater and Midazolam  and Treatment may vary due to age or medical condition  Airway Management Planned: Oral ETT  Additional Equipment: Arterial line, CVP, PA Cath, TEE and Ultrasound Guidance Line Placement  Intra-op Plan:   Post-operative Plan: Post-operative intubation/ventilation  Informed Consent: I have reviewed the patients History and Physical, chart, labs and discussed the procedure including the risks, benefits and alternatives for the proposed anesthesia with the patient or authorized representative who has indicated his/her understanding and acceptance.     Dental advisory given  Plan Discussed with: CRNA  Anesthesia Plan Comments:        Anesthesia Quick Evaluation

## 2020-03-05 NOTE — Progress Notes (Signed)
Pre-CABG Dopplers completed. Refer to "CV Proc" under chart review to view preliminary results.  03/05/2020 10:46 AM Kelby Aline., MHA, RVT, RDCS, RDMS

## 2020-03-05 NOTE — Progress Notes (Signed)
Right femoral sheath pulled with no complications. Manual pressure held for 20 minutes. Patient's vitals were stable during pull. Site level 0 at this time.

## 2020-03-05 NOTE — Progress Notes (Signed)
CARDIAC REHAB PHASE I   Preop education completed with pt and her sister. Pt given IS, able to demonstrate 1250. Pt also give Cardiac Surgery booklet and in-the-tube sheet. Educated on importance of walks, IS use, and sternal precautions after surgery. Pt denies CP but does c/o some nausea. Will continue to follow throughout her hospital stay.  [id number] [name], RN BSN 03/05/2020 2:19 PM

## 2020-03-05 NOTE — Progress Notes (Addendum)
Advanced Heart Failure Rounding Note  PCP-Cardiologist: Skeet Latch, MD   Subjective:     Yesterday had flash pulmonary edema post cath. Diuresed with 120 mg IV lasix. Brisk diuresis noted.   PICC placed --> CVP 5-6  Feeling better. Denies SOB/chest pain.    Objective:   Weight Range: (!) 136.9 kg Body mass index is 55.22 kg/m.   Vital Signs:   Temp:  [97.8 F (36.6 C)-98.3 F (36.8 C)] 98.3 F (36.8 C) (04/13 0355) Pulse Rate:  [81-99] 82 (04/13 0700) Resp:  [13-48] 21 (04/13 0700) BP: (86-144)/(33-95) 120/60 (04/13 0700) SpO2:  [89 %-100 %] 98 % (04/13 0700) FiO2 (%):  [40 %] 40 % (04/12 1306) Weight:  [136.9 kg] 136.9 kg (04/12 0919) Last BM Date: 03/03/20  Weight change: Filed Weights   03/02/20 0552 03/03/20 0541 03/04/20 0919  Weight: 135.5 kg 136 kg (!) 136.9 kg    Intake/Output:   Intake/Output Summary (Last 24 hours) at 03/05/2020 0720 Last data filed at 03/05/2020 0600 Gross per 24 hour  Intake 405.22 ml  Output 3925 ml  Net -3519.78 ml      Physical Exam   CVP 5-6  General:   No resp difficulty HEENT: Normal Neck: Supple. JVP 5-6 . Carotids 2+ bilat; no bruits. No lymphadenopathy or thyromegaly appreciated. Cor: PMI nondisplaced. Regular rate & rhythm. No rubs, gallops or murmurs. Lungs: Clear Abdomen: Soft, nontender, nondistended. No hepatosplenomegaly. No bruits or masses. Good bowel sounds. Extremities: No cyanosis, clubbing, rash, edema. RUE PICC Neuro: Alert & orientedx3, cranial nerves grossly intact. moves all 4 extremities w/o difficulty. Affect pleasant   Telemetry   NSR 80s   EKG    N/a   Labs    CBC Recent Labs    03/04/20 0408 03/05/20 0540  WBC 11.9* 11.1*  HGB 11.4* 11.2*  HCT 34.7* 34.5*  MCV 87.8 89.1  PLT 266 [id number]   Basic Metabolic Panel Recent Labs    03/04/20 0408 03/05/20 0540  NA 135 136  K 3.7 3.7  CL 100 99  CO2 24 26  GLUCOSE 146* 176*  BUN 26* 29*  CREATININE 1.32* 1.46*    CALCIUM 9.1 8.7*  MG 2.2 2.3   Liver Function Tests No results for input(s): AST, ALT, ALKPHOS, BILITOT, PROT, ALBUMIN in the last 72 hours. No results for input(s): LIPASE, AMYLASE in the last 72 hours. Cardiac Enzymes No results for input(s): CKTOTAL, CKMB, CKMBINDEX, TROPONINI in the last 72 hours.  BNP: BNP (last 3 results) No results for input(s): BNP in the last 8760 hours.  ProBNP (last 3 results) No results for input(s): PROBNP in the last 8760 hours.   D-Dimer No results for input(s): DDIMER in the last 72 hours. Hemoglobin A1C No results for input(s): HGBA1C in the last 72 hours. Fasting Lipid Panel No results for input(s): CHOL, HDL, LDLCALC, TRIG, CHOLHDL, LDLDIRECT in the last 72 hours. Thyroid Function Tests Recent Labs    03/05/20 0540  TSH 0.870    Other results:   Imaging    CARDIAC CATHETERIZATION  Result Date: 03/04/2020  The left ventricular ejection fraction is 25-35% by visual estimate.  LV end diastolic pressure is severely elevated.  There is severe left ventricular systolic dysfunction.   Widely patent left main  Ostial 99% LAD, followed by 80% mid LAD and 80% moderate size diagonal (bifurcation [place] 111).  75% ostial circumflex, 60% proximal circumflex, 90% mid circumflex proximal to a large third obtuse marginal.  Small ramus  intermedius with ostial 99% stenosis  Dominant RCA with distal 70% stenosis and 99% stenosis in the proximal third of the large PDA.  LVEF less than 30%.  Inferior wall motion appears relatively normal.  Anterior wall is akinetic.  LVEDP despite absence of the symptoms with the patient lying flat 38 to 40 mmHg.  Post-cath complication: Flash pulmonary edema while being transported from cath table to stretcher. RECOMMENDATIONS:  Acute pulmonary edema managed with IV Lasix 40 mg x 1; BiPAP to improve oxygenation; IV nitroglycerin which will be titrated to help lower LVEDP and improve coronary flow.  Attending, Dr.  Harrell Gave notified.  Advanced heart failure service consulted  Consultation with T CTS will be requested to help contemplate revascularization strategy.  She would be best served with multivessel CABG although her risk will be high.   ECHOCARDIOGRAM LIMITED  Result Date: 03/04/2020    ECHOCARDIOGRAM LIMITED REPORT   Patient Name:   Rhonda Roberts Date of Exam: 03/04/2020 Medical Rec #:  [id number]         Height:       62.0 in Accession #:    [id number]        Weight:       301.9 lb Date of Birth:  [birth year]         BSA:          2.277 m Patient Age:    64 years          BP:           132/77 mmHg Patient Gender: F                 HR:           90 bpm. Exam Location:  Inpatient Procedure: Limited Echo, Limited Color Doppler, Cardiac Doppler and Intracardiac            Opacification Agent                                MODIFIED REPORT:     This report was modified by Skeet Latch MD on 03/04/2020 due to RV                                   assessment.  Indications:     CHF-Acute Systolic [id number] / [id number]  History:         Patient has prior history of Echocardiogram examinations. Acute                  MI and CAD; Risk Factors:Hypertension, Dyslipidemia and                  Diabetes. GERD.  Sonographer:     Darlina Sicilian RDCS Referring Phys:  Edie Diagnosing Phys: Skeet Latch MD  Sonographer Comments: Technically challenging study due to limited acoustic windows. IMPRESSIONS  1. Left ventricular ejection fraction, by estimation, is 25 to 30%. The left ventricle has severely decreased function. The left ventricle demonstrates regional wall motion abnormalities (see scoring diagram/findings for description). There is mild left  ventricular hypertrophy of the basal-septal segment. Left ventricular diastolic parameters are consistent with Grade I diastolic dysfunction (impaired relaxation). Elevated left ventricular end-diastolic pressure.  2. Right ventricular systolic function is normal.  The right ventricular size is normal. Tricuspid regurgitation signal is inadequate for assessing PA pressure.  3. Trivial mitral valve regurgitation.  4. The aortic valve is tricuspid. FINDINGS  Left Ventricle: Left ventricular ejection fraction, by estimation, is 25 to 30%. The left ventricle has severely decreased function. The left ventricle demonstrates regional wall motion abnormalities. Definity contrast agent was given IV to delineate the left ventricular endocardial borders. There is mild left ventricular hypertrophy of the basal-septal segment. Elevated left ventricular end-diastolic pressure.  LV Wall Scoring: The apical lateral segment, mid anterolateral segment, apical anterior segment, apical inferior segment, and apex are akinetic. The anterior wall, entire anterior septum, and posterior wall are hypokinetic. The inferior wall, basal anterolateral segment, mid inferoseptal segment, and basal inferoseptal segment are normal. Right Ventricle: The right ventricular size is normal. No increase in right ventricular wall thickness. Right ventricular systolic function is normal. Tricuspid regurgitation signal is inadequate for assessing PA pressure. Mitral Valve: Trivial mitral valve regurgitation. Tricuspid Valve: Tricuspid valve regurgitation is trivial. Aortic Valve: The aortic valve is tricuspid.  LEFT VENTRICLE PLAX 2D LVIDd:         4.80 cm      Diastology LVIDs:         4.10 cm      LV e' lateral:   5.55 cm/s LV PW:         0.80 cm      LV E/e' lateral: 15.0 LV IVS:        1.10 cm      LV e' medial:    5.00 cm/s                             LV E/e' medial:  16.7  LV Volumes (MOD) LV vol d, MOD A2C: 131.0 ml LV vol d, MOD A4C: 130.0 ml LV vol s, MOD A2C: 91.3 ml LV vol s, MOD A4C: 85.9 ml LV SV MOD A2C:     39.7 ml LV SV MOD A4C:     130.0 ml LV SV MOD BP:      39.2 ml AORTIC VALVE LVOT Vmax:   103.00 cm/s LVOT Vmean:  69.100 cm/s LVOT VTI:    0.187 m MITRAL VALVE                TRICUSPID VALVE MV Area  (PHT): 4.89 cm     TR Peak grad:   14.6 mmHg MV Decel Time: 155 msec     TR Vmax:        191.00 cm/s MV E velocity: 83.40 cm/s MV A velocity: 110.00 cm/s  SHUNTS MV E/A ratio:  0.76         Systemic VTI: 0.19 m Skeet Latch MD Electronically signed by Skeet Latch MD Signature Date/Time: 03/04/2020/6:53:25 PM    Final (Updated)    [place] EKG SITE RITE  Result Date: 03/04/2020 If Site Rite image not attached, placement could not be confirmed due to current cardiac rhythm.     Medications:     Scheduled Medications: . aspirin  81 mg Oral Daily  . atorvastatin  80 mg Oral q1800  . carvedilol  6.25 mg Oral BID WC  . Chlorhexidine Gluconate Cloth  6 each Topical Daily  . furosemide  80 mg Intravenous BID  . hydrALAZINE  12.5 mg Oral Q8H  . insulin aspart  0-20 Units Subcutaneous TID WC  . insulin aspart  0-5 Units Subcutaneous QHS  . insulin aspart  10 Units Subcutaneous TID WC  . insulin NPH Human  40 Units  Subcutaneous Q12H  . isosorbide mononitrate  30 mg Oral Daily  . loratadine  10 mg Oral Daily  . sodium chloride flush  10-40 mL Intracatheter Q12H  . sodium chloride flush  3 mL Intravenous Q12H  . sodium chloride flush  3 mL Intravenous Q12H     Infusions: . sodium chloride 10 mL/hr at 03/05/20 0600  . cefTRIAXone (ROCEPHIN)  IV Stopped (03/04/20 1621)  . heparin 1,250 Units/hr (03/05/20 0600)     PRN Medications:  sodium chloride, acetaminophen, metoprolol tartrate, nitroGLYCERIN, ondansetron (ZOFRAN) IV, oxyCODONE, sodium chloride flush, sodium chloride flush, sodium chloride flush    Assessment/Plan   1. NSTEMI  HS Trop >10000  LHC with 99% LAD, 80% mid circumflex, 70% ostial circumflex, 99% small ramus. 95% mid LAD, Mid 99% PDA   Continue heparin drip   Continue statin and bb.   CT surgery consulted. Anticipate CABG 4/14  2. Acute Systolic Heart Failure , ICM  - EF on cath ~ 30 %.  - CO-OX 87%. Repeat. No inotropes.  -  Diuresed with IV lasix.  Brisk diuresis noted. Creatinine bumped - CVP 5. Stop IV lasix. Start po lasix 40 mg daily.  - Continue carvedilol for now.  - Hydralazine + Imdur  3. Acute Respiratory Failure -Placed on Bipap in the cath lab.  - Weaned to nasal cannula. Sats stable.    4. Melena  Hgb trending down 14>11.7>11.1  - Continue protonix.  -May need GI  Had colonoscopy 2017 by Dr Benson [place] with Two 2 to 3 mm polyps in the transverse colon and in the ascending colon and diverticulosis in the sigmoid colon and in the descending colon.  5. DMII -Hgb A1C 10  -Continue sliding scale.    6. UTI -Noted on lab work 02/28/20 prior to admit.  - WBC 11.1 - Urine culture resent 03/04/20 - Continue rocephin.   Length of Stay: 3  Amy Clegg, NP  03/05/2020, 7:20 AM  Advanced Heart Failure Team Pager [phone] (M-F; 7a - 4p)  Please contact [facility] for night-coverage after hours (4p -7a ) and weekends on [email or web address]  Patient seen with NP, agree with the above note.   She is doing much better today.  CVP 5, co-ox probably inaccurate.  Breathing better.  No chest pain. Excellent diuresis yesterday with IV Lasix.   General: NAD Neck: JVP 8 cm, no thyromegaly or thyroid nodule.  Lungs: Clear to auscultation bilaterally with normal respiratory effort. CV: Nondisplaced PMI.  Heart regular S1/S2, no S3/S4, no murmur.  1+ edema 1/2 to knees.  Abdomen: Soft, nontender, no hepatosplenomegaly, no distention.  Skin: Intact without lesions or rashes.  Neurologic: Alert and oriented x 3.  Psych: Normal affect. Extremities: No clubbing or cyanosis.  HEENT: Normal.   Pulmonary edema significantly improved.  Echo today with EF about 30% with apical severe hypokinesis, normal RV.  Creatinine mildly higher at 1.49.  - Transition to po Lasix.  - Continue current hydralazine/imdur.  - Continue Coreg 6.25 mg bid.  - Add spironolactone 12.5 daily.   She has severe 3VD, I think that she is stable at this point for CABG,  plan for tomorrow.  - Continue ASA 81 and atorvastatin.   Ceftriaxone for UTI.   Loralie Champagne 03/05/2020 2:43 PM

## 2020-03-05 NOTE — Progress Notes (Addendum)
ANTICOAGULATION CONSULT NOTE   Pharmacy Consult for Heparin Indication: CAD  Patient Measurements: Height: 5\' 2"  (157.5 cm) Weight: (!) 136.9 kg (301 lb 14.4 oz) IBW/kg (Calculated) : 50.1 Heparin Dosing Weight: 84.5 kg  Vital Signs: Temp: 99 F (37.2 C) (04/13 1127) Temp Source: Oral (04/13 1127) BP: 145/100 (04/13 1300) Pulse Rate: 86 (04/13 1300)  Labs: Recent Labs    03/03/20 0557 03/03/20 0557 03/03/20 0606 03/03/20 1236 03/03/20 1236 03/04/20 0408 03/05/20 0540  HGB 11.7*   < >  --  11.9*   < > 11.4* 11.2*  HCT 36.2   < >  --  36.8  --  34.7* 34.5*  PLT 271  --   --   --   --  266 274  HEPARINUNFRC  --   --  0.46  --   --  0.62 0.44  0.45  CREATININE  --   --   --   --   --  1.32* 1.46*  TROPONINIHS  --   --   --  8,166*  --   --   --    < > = values in this interval not displayed.    Estimated Creatinine Clearance: 52.1 mL/min (A) (by C-G formula based on SCr of 1.46 mg/dL (H)).   Assessment: [name] started on heparin for ACS. Rivaroxaban noted on prior med rec, but per current med rec patient is not taking d/t money/insurance issues.    Now s/p cath with multivessel CAD, TCTS planning CABG for 4/14.  Pharmacy has been asked to continue heparin  HL 0.44 at goal. Reportedly, patient had blood in stools before admission but none since being here. CBC stable.    Goal of Therapy:  Heparin level 0.3-0.7 units/ml Monitor platelets by anticoagulation protocol: Yes   Plan:  Continue heparin at 1250 units/hr Monitor daily HL, CBC/Pltc Monitor for signs/symptoms of bleeding    Vertis Kelch, PharmD, [facility] PGY2 Cardiology Pharmacy Resident Phone [phone] 03/05/2020       3:06 PM  Please check [email or web address] for unit-specific pharmacist phone numbers

## 2020-03-06 ENCOUNTER — Inpatient Hospital Stay (HOSPITAL_COMMUNITY): Payer: Self-pay

## 2020-03-06 ENCOUNTER — Inpatient Hospital Stay (HOSPITAL_COMMUNITY): Payer: Self-pay | Admitting: Anesthesiology

## 2020-03-06 ENCOUNTER — Inpatient Hospital Stay (HOSPITAL_COMMUNITY)
Admission: EM | Disposition: A | Discharge status: Home Health | Payer: Self-pay | Source: Home / Self Care | Attending: Cardiothoracic Surgery

## 2020-03-06 HISTORY — PX: RADIAL ARTERY HARVEST: SHX5067

## 2020-03-06 HISTORY — PX: CORONARY ARTERY BYPASS GRAFT: SHX141

## 2020-03-06 HISTORY — PX: TEE WITHOUT CARDIOVERSION: SHX5443

## 2020-03-06 LAB — POCT I-STAT, CHEM 8
BUN: 24 mg/dL — ABNORMAL HIGH (ref 8–23)
BUN: 24 mg/dL — ABNORMAL HIGH (ref 8–23)
BUN: 26 mg/dL — ABNORMAL HIGH (ref 8–23)
BUN: 23 mg/dL (ref 8–23)
BUN: 24 mg/dL — ABNORMAL HIGH (ref 8–23)
BUN: 25 mg/dL — ABNORMAL HIGH (ref 8–23)
Calcium, Ion: 1.25 mmol/L (ref 1.15–1.40)
Calcium, Ion: 1.24 mmol/L (ref 1.15–1.40)
Calcium, Ion: 1.19 mmol/L (ref 1.15–1.40)
Calcium, Ion: 1.11 mmol/L — ABNORMAL LOW (ref 1.15–1.40)
Calcium, Ion: 1.12 mmol/L — ABNORMAL LOW (ref 1.15–1.40)
Calcium, Ion: 1.57 mmol/L (ref 1.15–1.40)
Chloride: 100 mmol/L (ref 98–111)
Chloride: 100 mmol/L (ref 98–111)
Chloride: 101 mmol/L (ref 98–111)
Chloride: 100 mmol/L (ref 98–111)
Chloride: 101 mmol/L (ref 98–111)
Chloride: 104 mmol/L (ref 98–111)
Creatinine, Ser: 1.3 mg/dL — ABNORMAL HIGH (ref 0.44–1.00)
Creatinine, Ser: 1.3 mg/dL — ABNORMAL HIGH (ref 0.44–1.00)
Creatinine, Ser: 1.1 mg/dL — ABNORMAL HIGH (ref 0.44–1.00)
Creatinine, Ser: 1.1 mg/dL — ABNORMAL HIGH (ref 0.44–1.00)
Creatinine, Ser: 1.1 mg/dL — ABNORMAL HIGH (ref 0.44–1.00)
Creatinine, Ser: 1 mg/dL (ref 0.44–1.00)
Glucose, Bld: 176 mg/dL — ABNORMAL HIGH (ref 70–99)
Glucose, Bld: 154 mg/dL — ABNORMAL HIGH (ref 70–99)
Glucose, Bld: 129 mg/dL — ABNORMAL HIGH (ref 70–99)
Glucose, Bld: 110 mg/dL — ABNORMAL HIGH (ref 70–99)
Glucose, Bld: 135 mg/dL — ABNORMAL HIGH (ref 70–99)
Glucose, Bld: 232 mg/dL — ABNORMAL HIGH (ref 70–99)
HCT: 32 % — ABNORMAL LOW (ref 36.0–46.0)
HCT: 32 % — ABNORMAL LOW (ref 36.0–46.0)
HCT: 33 % — ABNORMAL LOW (ref 36.0–46.0)
HCT: 24 % — ABNORMAL LOW (ref 36.0–46.0)
HCT: 25 % — ABNORMAL LOW (ref 36.0–46.0)
HCT: 24 % — ABNORMAL LOW (ref 36.0–46.0)
Hemoglobin: 10.9 g/dL — ABNORMAL LOW (ref 12.0–15.0)
Hemoglobin: 10.9 g/dL — ABNORMAL LOW (ref 12.0–15.0)
Hemoglobin: 11.2 g/dL — ABNORMAL LOW (ref 12.0–15.0)
Hemoglobin: 8.2 g/dL — ABNORMAL LOW (ref 12.0–15.0)
Hemoglobin: 8.5 g/dL — ABNORMAL LOW (ref 12.0–15.0)
Hemoglobin: 8.2 g/dL — ABNORMAL LOW (ref 12.0–15.0)
Potassium: 3.9 mmol/L (ref 3.5–5.1)
Potassium: 4 mmol/L (ref 3.5–5.1)
Potassium: 4.2 mmol/L (ref 3.5–5.1)
Potassium: 6.7 mmol/L (ref 3.5–5.1)
Potassium: 5.2 mmol/L — ABNORMAL HIGH (ref 3.5–5.1)
Potassium: 5 mmol/L (ref 3.5–5.1)
Sodium: 137 mmol/L (ref 135–145)
Sodium: 137 mmol/L (ref 135–145)
Sodium: 137 mmol/L (ref 135–145)
Sodium: 133 mmol/L — ABNORMAL LOW (ref 135–145)
Sodium: 134 mmol/L — ABNORMAL LOW (ref 135–145)
Sodium: 137 mmol/L (ref 135–145)
TCO2: 30 mmol/L (ref 22–32)
TCO2: 28 mmol/L (ref 22–32)
TCO2: 28 mmol/L (ref 22–32)
TCO2: 28 mmol/L (ref 22–32)
TCO2: 30 mmol/L (ref 22–32)
TCO2: 29 mmol/L (ref 22–32)

## 2020-03-06 LAB — POCT I-STAT 7, (LYTES, BLD GAS, ICA,H+H)
Acid-Base Excess: 1 mmol/L (ref 0.0–2.0)
Acid-Base Excess: 2 mmol/L (ref 0.0–2.0)
Acid-Base Excess: 5 mmol/L — ABNORMAL HIGH (ref 0.0–2.0)
Acid-Base Excess: 3 mmol/L — ABNORMAL HIGH (ref 0.0–2.0)
Acid-Base Excess: 1 mmol/L (ref 0.0–2.0)
Acid-base deficit: 2 mmol/L (ref 0.0–2.0)
Bicarbonate: 24.8 mmol/L (ref 20.0–28.0)
Bicarbonate: 27.7 mmol/L (ref 20.0–28.0)
Bicarbonate: 28.5 mmol/L — ABNORMAL HIGH (ref 20.0–28.0)
Bicarbonate: 24.6 mmol/L (ref 20.0–28.0)
Bicarbonate: 27.4 mmol/L (ref 20.0–28.0)
Bicarbonate: 25.5 mmol/L (ref 20.0–28.0)
Bicarbonate: 22.6 mmol/L (ref 20.0–28.0)
Calcium, Ion: 1.21 mmol/L (ref 1.15–1.40)
Calcium, Ion: 1.04 mmol/L — ABNORMAL LOW (ref 1.15–1.40)
Calcium, Ion: 1.11 mmol/L — ABNORMAL LOW (ref 1.15–1.40)
Calcium, Ion: 1.57 mmol/L (ref 1.15–1.40)
Calcium, Ion: 1.24 mmol/L (ref 1.15–1.40)
Calcium, Ion: 1.2 mmol/L (ref 1.15–1.40)
Calcium, Ion: 1.14 mmol/L — ABNORMAL LOW (ref 1.15–1.40)
HCT: 35 % — ABNORMAL LOW (ref 36.0–46.0)
HCT: 25 % — ABNORMAL LOW (ref 36.0–46.0)
HCT: 26 % — ABNORMAL LOW (ref 36.0–46.0)
HCT: 23 % — ABNORMAL LOW (ref 36.0–46.0)
HCT: 22 % — ABNORMAL LOW (ref 36.0–46.0)
HCT: 30 % — ABNORMAL LOW (ref 36.0–46.0)
HCT: 31 % — ABNORMAL LOW (ref 36.0–46.0)
Hemoglobin: 11.9 g/dL — ABNORMAL LOW (ref 12.0–15.0)
Hemoglobin: 8.5 g/dL — ABNORMAL LOW (ref 12.0–15.0)
Hemoglobin: 8.8 g/dL — ABNORMAL LOW (ref 12.0–15.0)
Hemoglobin: 7.8 g/dL — ABNORMAL LOW (ref 12.0–15.0)
Hemoglobin: 7.5 g/dL — ABNORMAL LOW (ref 12.0–15.0)
Hemoglobin: 10.2 g/dL — ABNORMAL LOW (ref 12.0–15.0)
Hemoglobin: 10.5 g/dL — ABNORMAL LOW (ref 12.0–15.0)
O2 Saturation: 94 %
O2 Saturation: 100 %
O2 Saturation: 100 %
O2 Saturation: 100 %
O2 Saturation: 100 %
O2 Saturation: 100 %
O2 Saturation: 100 %
Patient temperature: 98.9
Patient temperature: 36.8
Potassium: 3.9 mmol/L (ref 3.5–5.1)
Potassium: 4.1 mmol/L (ref 3.5–5.1)
Potassium: 4.9 mmol/L (ref 3.5–5.1)
Potassium: 5.4 mmol/L — ABNORMAL HIGH (ref 3.5–5.1)
Potassium: 4.6 mmol/L (ref 3.5–5.1)
Potassium: 4.3 mmol/L (ref 3.5–5.1)
Potassium: 4.2 mmol/L (ref 3.5–5.1)
Sodium: 136 mmol/L (ref 135–145)
Sodium: 132 mmol/L — ABNORMAL LOW (ref 135–145)
Sodium: 136 mmol/L (ref 135–145)
Sodium: 136 mmol/L (ref 135–145)
Sodium: 138 mmol/L (ref 135–145)
Sodium: 138 mmol/L (ref 135–145)
Sodium: 139 mmol/L (ref 135–145)
TCO2: 26 mmol/L (ref 22–32)
TCO2: 29 mmol/L (ref 22–32)
TCO2: 30 mmol/L (ref 22–32)
TCO2: 26 mmol/L (ref 22–32)
TCO2: 29 mmol/L (ref 22–32)
TCO2: 27 mmol/L (ref 22–32)
TCO2: 24 mmol/L (ref 22–32)
pCO2 arterial: 36.2 mmHg (ref 32.0–48.0)
pCO2 arterial: 48.2 mmHg — ABNORMAL HIGH (ref 32.0–48.0)
pCO2 arterial: 38 mmHg (ref 32.0–48.0)
pCO2 arterial: 39.4 mmHg (ref 32.0–48.0)
pCO2 arterial: 41.2 mmHg (ref 32.0–48.0)
pCO2 arterial: 38.6 mmHg (ref 32.0–48.0)
pCO2 arterial: 36 mmHg (ref 32.0–48.0)
pH, Arterial: 7.445 (ref 7.350–7.450)
pH, Arterial: 7.368 (ref 7.350–7.450)
pH, Arterial: 7.484 — ABNORMAL HIGH (ref 7.350–7.450)
pH, Arterial: 7.403 (ref 7.350–7.450)
pH, Arterial: 7.431 (ref 7.350–7.450)
pH, Arterial: 7.428 (ref 7.350–7.450)
pH, Arterial: 7.405 (ref 7.350–7.450)
pO2, Arterial: 69 mmHg — ABNORMAL LOW (ref 83.0–108.0)
pO2, Arterial: 404 mmHg — ABNORMAL HIGH (ref 83.0–108.0)
pO2, Arterial: 381 mmHg — ABNORMAL HIGH (ref 83.0–108.0)
pO2, Arterial: 348 mmHg — ABNORMAL HIGH (ref 83.0–108.0)
pO2, Arterial: 573 mmHg — ABNORMAL HIGH (ref 83.0–108.0)
pO2, Arterial: 301 mmHg — ABNORMAL HIGH (ref 83.0–108.0)
pO2, Arterial: 357 mmHg — ABNORMAL HIGH (ref 83.0–108.0)

## 2020-03-06 LAB — COOXEMETRY PANEL
Carboxyhemoglobin: 0.7 % (ref 0.5–1.5)
Carboxyhemoglobin: 0.8 % (ref 0.5–1.5)
Methemoglobin: 0.4 % (ref 0.0–1.5)
Methemoglobin: 1.4 % (ref 0.0–1.5)
O2 Saturation: 65.8 %
O2 Saturation: 67.6 %
Total hemoglobin: 19 g/dL — ABNORMAL HIGH (ref 12.0–16.0)
Total hemoglobin: 9.3 g/dL — ABNORMAL LOW (ref 12.0–16.0)

## 2020-03-06 LAB — BASIC METABOLIC PANEL
Anion gap: 11 (ref 5–15)
BUN: 26 mg/dL — ABNORMAL HIGH (ref 8–23)
CO2: 24 mmol/L (ref 22–32)
Calcium: 8.6 mg/dL — ABNORMAL LOW (ref 8.9–10.3)
Chloride: 100 mmol/L (ref 98–111)
Creatinine, Ser: 1.24 mg/dL — ABNORMAL HIGH (ref 0.44–1.00)
GFR calc Af Amer: 53 mL/min — ABNORMAL LOW (ref >60)
GFR calc non Af Amer: 46 mL/min — ABNORMAL LOW (ref >60)
Glucose, Bld: 141 mg/dL — ABNORMAL HIGH (ref 70–99)
Potassium: 3.8 mmol/L (ref 3.5–5.1)
Sodium: 135 mmol/L (ref 135–145)

## 2020-03-06 LAB — CBC
HCT: 33.7 % — ABNORMAL LOW (ref 36.0–46.0)
HCT: 33.4 % — ABNORMAL LOW (ref 36.0–46.0)
HCT: 27.7 % — ABNORMAL LOW (ref 36.0–46.0)
Hemoglobin: 11 g/dL — ABNORMAL LOW (ref 12.0–15.0)
Hemoglobin: 11.1 g/dL — ABNORMAL LOW (ref 12.0–15.0)
Hemoglobin: 9 g/dL — ABNORMAL LOW (ref 12.0–15.0)
MCH: 28.9 pg (ref 26.0–34.0)
MCH: 29.1 pg (ref 26.0–34.0)
MCH: 28.8 pg (ref 26.0–34.0)
MCHC: 32.6 g/dL (ref 30.0–36.0)
MCHC: 33.2 g/dL (ref 30.0–36.0)
MCHC: 32.5 g/dL (ref 30.0–36.0)
MCV: 88.7 fL (ref 80.0–100.0)
MCV: 87.7 fL (ref 80.0–100.0)
MCV: 88.8 fL (ref 80.0–100.0)
Platelets: 251 10*3/uL (ref 150–400)
Platelets: 159 10*3/uL (ref 150–400)
Platelets: 136 10*3/uL — ABNORMAL LOW (ref 150–400)
RBC: 3.8 MIL/uL — ABNORMAL LOW (ref 3.87–5.11)
RBC: 3.81 MIL/uL — ABNORMAL LOW (ref 3.87–5.11)
RBC: 3.12 MIL/uL — ABNORMAL LOW (ref 3.87–5.11)
RDW: 15 % (ref 11.5–15.5)
RDW: 14.7 % (ref 11.5–15.5)
RDW: 14.8 % (ref 11.5–15.5)
WBC: 11 10*3/uL — ABNORMAL HIGH (ref 4.0–10.5)
WBC: 18.3 10*3/uL — ABNORMAL HIGH (ref 4.0–10.5)
WBC: 11.7 10*3/uL — ABNORMAL HIGH (ref 4.0–10.5)
nRBC: 0 % (ref 0.0–0.2)
nRBC: 0 % (ref 0.0–0.2)
nRBC: 0 % (ref 0.0–0.2)

## 2020-03-06 LAB — GLUCOSE, CAPILLARY
Glucose-Capillary: 142 mg/dL — ABNORMAL HIGH (ref 70–99)
Glucose-Capillary: 145 mg/dL — ABNORMAL HIGH (ref 70–99)
Glucose-Capillary: 169 mg/dL — ABNORMAL HIGH (ref 70–99)
Glucose-Capillary: 129 mg/dL — ABNORMAL HIGH (ref 70–99)
Glucose-Capillary: 136 mg/dL — ABNORMAL HIGH (ref 70–99)
Glucose-Capillary: 179 mg/dL — ABNORMAL HIGH (ref 70–99)
Glucose-Capillary: 224 mg/dL — ABNORMAL HIGH (ref 70–99)
Glucose-Capillary: 226 mg/dL — ABNORMAL HIGH (ref 70–99)
Glucose-Capillary: 225 mg/dL — ABNORMAL HIGH (ref 70–99)

## 2020-03-06 LAB — HEMOGLOBIN AND HEMATOCRIT, BLOOD
HCT: 24.9 % — ABNORMAL LOW (ref 36.0–46.0)
Hemoglobin: 8.1 g/dL — ABNORMAL LOW (ref 12.0–15.0)

## 2020-03-06 LAB — HEPARIN LEVEL (UNFRACTIONATED): Heparin Unfractionated: 0.41 IU/mL (ref 0.30–0.70)

## 2020-03-06 LAB — ABO/RH: ABO/RH(D): B POS

## 2020-03-06 LAB — APTT: aPTT: 26 seconds (ref 24–36)

## 2020-03-06 LAB — ECHO INTRAOPERATIVE TEE
Height: 62 in
Weight: 4797.21 oz

## 2020-03-06 LAB — PREPARE RBC (CROSSMATCH)

## 2020-03-06 LAB — PROTIME-INR
INR: 1.3 — ABNORMAL HIGH (ref 0.8–1.2)
Prothrombin Time: 16.1 seconds — ABNORMAL HIGH (ref 11.4–15.2)

## 2020-03-06 LAB — PLATELET COUNT: Platelets: 182 10*3/uL (ref 150–400)

## 2020-03-06 SURGERY — CORONARY ARTERY BYPASS GRAFTING (CABG)
Anesthesia: General | Site: Chest

## 2020-03-06 MED ORDER — METOPROLOL TARTRATE 5 MG/5ML IV SOLN: 2.5000 mg | INTRAVENOUS | Status: DC | PRN

## 2020-03-06 MED ORDER — PROPOFOL 10 MG/ML IV BOLUS
INTRAVENOUS | Status: AC
  Filled 2020-03-06: qty 20

## 2020-03-06 MED ORDER — LACTATED RINGERS IV SOLN: INTRAVENOUS | Status: DC

## 2020-03-06 MED ORDER — STERILE WATER FOR INJECTION IJ SOLN
INTRAMUSCULAR | Status: AC
  Filled 2020-03-06: qty 10

## 2020-03-06 MED ORDER — SODIUM CHLORIDE 0.9 % IV SOLN: INTRAVENOUS | Status: DC

## 2020-03-06 MED ORDER — PROTAMINE SULFATE 10 MG/ML IV SOLN
INTRAVENOUS | Status: DC | PRN
  Administered 2020-03-06: 300 mg via INTRAVENOUS

## 2020-03-06 MED ORDER — SODIUM CHLORIDE 0.9 % IV SOLN: INTRAVENOUS | Status: DC | PRN

## 2020-03-06 MED ORDER — MORPHINE SULFATE (PF) 2 MG/ML IV SOLN
1.0000 mg | INTRAVENOUS | Status: DC | PRN
  Administered 2020-03-06: 1 mg via INTRAVENOUS
  Administered 2020-03-07 (×4): 2 mg via INTRAVENOUS
  Filled 2020-03-06 (×5): qty 1

## 2020-03-06 MED ORDER — ROCURONIUM BROMIDE 10 MG/ML (PF) SYRINGE
PREFILLED_SYRINGE | INTRAVENOUS | Status: DC | PRN
  Administered 2020-03-06: 100 mg via INTRAVENOUS
  Administered 2020-03-06: 50 mg via INTRAVENOUS
  Administered 2020-03-06: 20 mg via INTRAVENOUS
  Administered 2020-03-06 (×2): 30 mg via INTRAVENOUS
  Administered 2020-03-06: 100 mg via INTRAVENOUS

## 2020-03-06 MED ORDER — HEMOSTATIC AGENTS (NO CHARGE) OPTIME
TOPICAL | Status: DC | PRN
  Administered 2020-03-06 (×4): 1 via TOPICAL

## 2020-03-06 MED ORDER — EPHEDRINE SULFATE 50 MG/ML IJ SOLN
INTRAMUSCULAR | Status: DC | PRN
  Administered 2020-03-06: 10 mg via INTRAVENOUS

## 2020-03-06 MED ORDER — DEXTROSE 50 % IV SOLN: 0.0000 mL | INTRAVENOUS | Status: DC | PRN

## 2020-03-06 MED ORDER — VASOPRESSIN 20 UNIT/ML IV SOLN
0.0000 [IU]/min | INTRAVENOUS | Status: DC
  Administered 2020-03-06 – 2020-03-07 (×2): 0.02 [IU]/min via INTRAVENOUS
  Filled 2020-03-06: qty 2

## 2020-03-06 MED ORDER — LACTATED RINGERS IV SOLN: INTRAVENOUS | Status: DC | PRN

## 2020-03-06 MED ORDER — ACETAMINOPHEN 650 MG RE SUPP
650.0000 mg | Freq: Once | RECTAL | Status: AC
  Administered 2020-03-06: 650 mg via RECTAL

## 2020-03-06 MED ORDER — VASOPRESSIN 20 UNIT/ML IV SOLN
0.0100 [IU]/min | INTRAVENOUS | Status: DC
  Filled 2020-03-06: qty 2

## 2020-03-06 MED ORDER — MIDAZOLAM HCL 5 MG/5ML IJ SOLN
INTRAMUSCULAR | Status: DC | PRN
  Administered 2020-03-06: 4 mg via INTRAVENOUS
  Administered 2020-03-06: 1 mg via INTRAVENOUS
  Administered 2020-03-06 (×2): 2 mg via INTRAVENOUS
  Administered 2020-03-06: 1 mg via INTRAVENOUS

## 2020-03-06 MED ORDER — PHENYLEPHRINE 40 MCG/ML (10ML) SYRINGE FOR IV PUSH (FOR BLOOD PRESSURE SUPPORT)
PREFILLED_SYRINGE | INTRAVENOUS | Status: DC | PRN
  Administered 2020-03-06: 40 ug via INTRAVENOUS
  Administered 2020-03-06 (×3): 80 ug via INTRAVENOUS

## 2020-03-06 MED ORDER — INSULIN REGULAR(HUMAN) IN NACL 100-0.9 UT/100ML-% IV SOLN
INTRAVENOUS | Status: DC
  Administered 2020-03-06: 17:00:00 2.4 [IU]/h via INTRAVENOUS
  Administered 2020-03-07: 01:00:00 7.5 [IU]/h via INTRAVENOUS
  Filled 2020-03-06: qty 100

## 2020-03-06 MED ORDER — DEXMEDETOMIDINE HCL IN NACL 400 MCG/100ML IV SOLN
0.0000 ug/kg/h | INTRAVENOUS | Status: DC
  Administered 2020-03-06: 17:00:00 0.7 ug/kg/h via INTRAVENOUS
  Administered 2020-03-07: 0.6 ug/kg/h via INTRAVENOUS
  Administered 2020-03-07 (×2): 0.7 ug/kg/h via INTRAVENOUS
  Filled 2020-03-06 (×3): qty 100

## 2020-03-06 MED ORDER — VASOPRESSIN 20 UNIT/ML IV SOLN
INTRAVENOUS | Status: DC | PRN
  Administered 2020-03-06: 2 [IU] via INTRAVENOUS

## 2020-03-06 MED ORDER — FENTANYL CITRATE (PF) 250 MCG/5ML IJ SOLN
INTRAMUSCULAR | Status: DC | PRN
  Administered 2020-03-06: 150 ug via INTRAVENOUS
  Administered 2020-03-06: 250 ug via INTRAVENOUS
  Administered 2020-03-06: 50 ug via INTRAVENOUS
  Administered 2020-03-06: 150 ug via INTRAVENOUS
  Administered 2020-03-06: 400 ug via INTRAVENOUS
  Administered 2020-03-06: 150 ug via INTRAVENOUS
  Administered 2020-03-06: 100 ug via INTRAVENOUS

## 2020-03-06 MED ORDER — DOCUSATE SODIUM 100 MG PO CAPS
200.0000 mg | ORAL_CAPSULE | Freq: Every day | ORAL | Status: DC
  Administered 2020-03-08 – 2020-03-15 (×6): 200 mg via ORAL
  Filled 2020-03-06 (×7): qty 2

## 2020-03-06 MED ORDER — SODIUM CHLORIDE 0.45 % IV SOLN: INTRAVENOUS | Status: DC | PRN

## 2020-03-06 MED ORDER — BUPIVACAINE LIPOSOME 1.3 % IJ SUSP
20.0000 mL | INTRAMUSCULAR | Status: AC
  Administered 2020-03-06: 11:00:00 20 mL
  Filled 2020-03-06: qty 20

## 2020-03-06 MED ORDER — PROPOFOL 10 MG/ML IV BOLUS
INTRAVENOUS | Status: DC | PRN
  Administered 2020-03-06: 40 mg via INTRAVENOUS
  Administered 2020-03-06: 30 mg via INTRAVENOUS

## 2020-03-06 MED ORDER — MIDAZOLAM HCL 2 MG/2ML IJ SOLN
2.0000 mg | INTRAMUSCULAR | Status: DC | PRN
  Administered 2020-03-07 (×3): 2 mg via INTRAVENOUS
  Filled 2020-03-06 (×3): qty 2

## 2020-03-06 MED ORDER — SODIUM CHLORIDE 0.9 % IV SOLN: 250.0000 mL | INTRAVENOUS | Status: DC

## 2020-03-06 MED ORDER — ACETAMINOPHEN 160 MG/5ML PO SOLN
1000.0000 mg | Freq: Four times a day (QID) | ORAL | Status: AC
  Administered 2020-03-07 (×3): 1000 mg
  Filled 2020-03-06 (×4): qty 40.6

## 2020-03-06 MED ORDER — TRAMADOL HCL 50 MG PO TABS
50.0000 mg | ORAL_TABLET | ORAL | Status: DC | PRN
  Administered 2020-03-11: 100 mg via ORAL
  Filled 2020-03-06: qty 2

## 2020-03-06 MED ORDER — OXYCODONE HCL 5 MG PO TABS
5.0000 mg | ORAL_TABLET | ORAL | Status: DC | PRN
  Administered 2020-03-07: 10 mg via ORAL
  Administered 2020-03-07: 5 mg via ORAL
  Administered 2020-03-07: 10 mg via ORAL
  Administered 2020-03-07: 5 mg via ORAL
  Administered 2020-03-08 – 2020-03-15 (×25): 10 mg via ORAL
  Filled 2020-03-06: qty 2
  Filled 2020-03-06: qty 1
  Filled 2020-03-06 (×14): qty 2
  Filled 2020-03-06: qty 1
  Filled 2020-03-06 (×12): qty 2

## 2020-03-06 MED ORDER — ACETAMINOPHEN 500 MG PO TABS
1000.0000 mg | ORAL_TABLET | Freq: Four times a day (QID) | ORAL | Status: AC
  Administered 2020-03-07 – 2020-03-11 (×15): 1000 mg via ORAL
  Filled 2020-03-06 (×14): qty 2

## 2020-03-06 MED ORDER — EPINEPHRINE HCL 5 MG/250ML IV SOLN IN NS
0.5000 ug/min | INTRAVENOUS | Status: DC
  Administered 2020-03-06: 18:00:00 5 ug/min via INTRAVENOUS
  Administered 2020-03-07 (×2): 4 ug/min via INTRAVENOUS
  Administered 2020-03-08: 3 ug/min via INTRAVENOUS
  Filled 2020-03-06 (×2): qty 250

## 2020-03-06 MED ORDER — SODIUM CHLORIDE 0.9% FLUSH
3.0000 mL | Freq: Two times a day (BID) | INTRAVENOUS | Status: DC
  Administered 2020-03-07 – 2020-03-10 (×7): 3 mL via INTRAVENOUS

## 2020-03-06 MED ORDER — LACTATED RINGERS IV SOLN: 500.0000 mL | Freq: Once | INTRAVENOUS | Status: DC | PRN

## 2020-03-06 MED ORDER — PANTOPRAZOLE SODIUM 40 MG PO TBEC
40.0000 mg | DELAYED_RELEASE_TABLET | Freq: Every day | ORAL | Status: DC
  Administered 2020-03-08 – 2020-03-15 (×8): 40 mg via ORAL
  Filled 2020-03-06 (×9): qty 1

## 2020-03-06 MED ORDER — BISACODYL 5 MG PO TBEC
10.0000 mg | DELAYED_RELEASE_TABLET | Freq: Every day | ORAL | Status: DC
  Administered 2020-03-08 – 2020-03-15 (×5): 10 mg via ORAL
  Filled 2020-03-06 (×7): qty 2

## 2020-03-06 MED ORDER — ARTIFICIAL TEARS OPHTHALMIC OINT
TOPICAL_OINTMENT | OPHTHALMIC | Status: DC | PRN
  Administered 2020-03-06: 1 via OPHTHALMIC

## 2020-03-06 MED ORDER — FAMOTIDINE IN NACL 20-0.9 MG/50ML-% IV SOLN
20.0000 mg | Freq: Two times a day (BID) | INTRAVENOUS | Status: AC
  Administered 2020-03-06 – 2020-03-07 (×2): 20 mg via INTRAVENOUS
  Filled 2020-03-06 (×2): qty 50

## 2020-03-06 MED ORDER — SODIUM CHLORIDE 0.9 % IV SOLN
1.5000 g | Freq: Two times a day (BID) | INTRAVENOUS | Status: AC
  Administered 2020-03-06 – 2020-03-08 (×4): 1.5 g via INTRAVENOUS
  Filled 2020-03-06 (×4): qty 1.5

## 2020-03-06 MED ORDER — 0.9 % SODIUM CHLORIDE (POUR BTL) OPTIME
TOPICAL | Status: DC | PRN
  Administered 2020-03-06: 5000 mL
  Administered 2020-03-06: 1000 mL

## 2020-03-06 MED ORDER — ONDANSETRON HCL 4 MG/2ML IJ SOLN
4.0000 mg | Freq: Four times a day (QID) | INTRAMUSCULAR | Status: DC | PRN
  Administered 2020-03-08 – 2020-03-13 (×5): 4 mg via INTRAVENOUS
  Filled 2020-03-06 (×5): qty 2

## 2020-03-06 MED ORDER — CHLORHEXIDINE GLUCONATE CLOTH 2 % EX PADS
6.0000 | MEDICATED_PAD | Freq: Every day | CUTANEOUS | Status: DC
  Administered 2020-03-06 – 2020-03-14 (×8): 6 via TOPICAL

## 2020-03-06 MED ORDER — VANCOMYCIN HCL IN DEXTROSE 1-5 GM/200ML-% IV SOLN
1000.0000 mg | Freq: Once | INTRAVENOUS | Status: AC
  Administered 2020-03-06: 1000 mg via INTRAVENOUS
  Filled 2020-03-06: qty 200

## 2020-03-06 MED ORDER — BUPIVACAINE HCL (PF) 0.5 % IJ SOLN
INTRAMUSCULAR | Status: AC
  Filled 2020-03-06: qty 30

## 2020-03-06 MED ORDER — BUPIVACAINE HCL (PF) 0.5 % IJ SOLN
INTRAMUSCULAR | Status: DC | PRN
  Administered 2020-03-06: 30 mL

## 2020-03-06 MED ORDER — ASPIRIN EC 325 MG PO TBEC: 325.0000 mg | DELAYED_RELEASE_TABLET | Freq: Every day | ORAL | Status: DC

## 2020-03-06 MED ORDER — MILRINONE LACTATE IN DEXTROSE 20-5 MG/100ML-% IV SOLN
0.2500 ug/kg/min | INTRAVENOUS | Status: DC
  Administered 2020-03-06 – 2020-03-07 (×4): 0.375 ug/kg/min via INTRAVENOUS
  Administered 2020-03-08: 0.25 ug/kg/min via INTRAVENOUS
  Filled 2020-03-06 (×5): qty 100

## 2020-03-06 MED ORDER — BISACODYL 10 MG RE SUPP
10.0000 mg | Freq: Every day | RECTAL | Status: DC
  Administered 2020-03-07: 10 mg via RECTAL
  Filled 2020-03-06: qty 1

## 2020-03-06 MED ORDER — SODIUM CHLORIDE 0.9% FLUSH: 3.0000 mL | INTRAVENOUS | Status: DC | PRN

## 2020-03-06 MED ORDER — FENTANYL CITRATE (PF) 250 MCG/5ML IJ SOLN
INTRAMUSCULAR | Status: AC
  Filled 2020-03-06: qty 25

## 2020-03-06 MED ORDER — POTASSIUM CHLORIDE 10 MEQ/50ML IV SOLN: 10.0000 meq | INTRAVENOUS | Status: AC

## 2020-03-06 MED ORDER — VANCOMYCIN HCL 1000 MG IV SOLR
INTRAVENOUS | Status: AC
  Filled 2020-03-06: qty 3000

## 2020-03-06 MED ORDER — ALBUMIN HUMAN 5 % IV SOLN: INTRAVENOUS | Status: DC | PRN

## 2020-03-06 MED ORDER — ALBUMIN HUMAN 5 % IV SOLN
250.0000 mL | INTRAVENOUS | Status: AC | PRN
  Administered 2020-03-06 (×4): 12.5 g via INTRAVENOUS
  Filled 2020-03-06: qty 250

## 2020-03-06 MED ORDER — ACETAMINOPHEN 160 MG/5ML PO SOLN: 650.0000 mg | Freq: Once | ORAL | Status: AC

## 2020-03-06 MED ORDER — VANCOMYCIN HCL 1000 MG IV SOLR
INTRAVENOUS | Status: DC | PRN
  Administered 2020-03-06: 3000 mg via TOPICAL

## 2020-03-06 MED ORDER — VASOPRESSIN 20 UNIT/ML IV SOLN
INTRAVENOUS | Status: AC
  Filled 2020-03-06: qty 1

## 2020-03-06 MED ORDER — PHENYLEPHRINE HCL-NACL 20-0.9 MG/250ML-% IV SOLN: 0.0000 ug/min | INTRAVENOUS | Status: DC

## 2020-03-06 MED ORDER — CHLORHEXIDINE GLUCONATE 0.12 % MT SOLN
15.0000 mL | OROMUCOSAL | Status: AC
  Administered 2020-03-06: 15 mL via OROMUCOSAL
  Filled 2020-03-06: qty 15

## 2020-03-06 MED ORDER — ASPIRIN 81 MG PO CHEW
324.0000 mg | CHEWABLE_TABLET | Freq: Every day | ORAL | Status: DC
  Administered 2020-03-07: 324 mg
  Filled 2020-03-06: qty 4

## 2020-03-06 MED ORDER — PLASMA-LYTE 148 IV SOLN
INTRAVENOUS | Status: DC | PRN
  Administered 2020-03-06: 500 mL

## 2020-03-06 MED ORDER — HEPARIN SODIUM (PORCINE) 1000 UNIT/ML IJ SOLN
INTRAMUSCULAR | Status: DC | PRN
  Administered 2020-03-06: 35000 [IU] via INTRAVENOUS

## 2020-03-06 MED ORDER — MIDAZOLAM HCL (PF) 10 MG/2ML IJ SOLN
INTRAMUSCULAR | Status: AC
  Filled 2020-03-06: qty 2

## 2020-03-06 MED ORDER — STERILE WATER FOR INJECTION IJ SOLN
INTRAMUSCULAR | Status: DC | PRN
  Administered 2020-03-06: 12:00:00 10 mL via INTRA_ARTERIAL

## 2020-03-06 MED ORDER — METOPROLOL TARTRATE 25 MG/10 ML ORAL SUSPENSION: 12.5000 mg | Freq: Two times a day (BID) | ORAL | Status: DC

## 2020-03-06 MED ORDER — MAGNESIUM SULFATE 4 GM/100ML IV SOLN
4.0000 g | Freq: Once | INTRAVENOUS | Status: AC
  Administered 2020-03-06: 4 g via INTRAVENOUS

## 2020-03-06 MED ORDER — NITROGLYCERIN IN D5W 200-5 MCG/ML-% IV SOLN: 7.0000 ug/min | INTRAVENOUS | Status: DC

## 2020-03-06 MED ORDER — NOREPINEPHRINE 4 MG/250ML-% IV SOLN
0.0000 ug/min | INTRAVENOUS | Status: DC
  Administered 2020-03-06: 4 ug/min via INTRAVENOUS

## 2020-03-06 MED ORDER — METOPROLOL TARTRATE 12.5 MG HALF TABLET: 12.5000 mg | ORAL_TABLET | Freq: Two times a day (BID) | ORAL | Status: DC

## 2020-03-06 SURGICAL SUPPLY — 129 items
ADAPTER CARDIO PERF ANTE/RETRO (ADAPTER) ×4 IMPLANT
ADH SKN CLS APL DERMABOND .7 (GAUZE/BANDAGES/DRESSINGS) ×3
ADPR PRFSN 84XANTGRD RTRGD (ADAPTER) ×3
ADPR TBG 2 MALE LL ART (MISCELLANEOUS) ×3
APPLIER CLIP 9.375 SM OPEN (CLIP)
APR CLP SM 9.3 20 MLT OPN (CLIP)
BAG DECANTER FOR FLEXI CONT (MISCELLANEOUS) ×4 IMPLANT
BASKET HEART (ORDER IN 25'S) (MISCELLANEOUS) ×4
BASKET HEART (ORDER IN 25S) (MISCELLANEOUS) ×3 IMPLANT
BATTERY MAXDRIVER (MISCELLANEOUS) ×4 IMPLANT
BLADE CLIPPER SURG (BLADE) ×4 IMPLANT
BLADE STERNUM SYSTEM 6 (BLADE) ×4 IMPLANT
BLADE SURG 15 STRL LF DISP TIS (BLADE) ×3 IMPLANT
BLADE SURG 15 STRL SS (BLADE) ×4
BNDG ELASTIC 4X5.8 VLCR STR LF (GAUZE/BANDAGES/DRESSINGS) ×4 IMPLANT
BNDG ELASTIC 6X5.8 VLCR STR LF (GAUZE/BANDAGES/DRESSINGS) IMPLANT
BNDG GAUZE ELAST 4 BULKY (GAUZE/BANDAGES/DRESSINGS) ×4 IMPLANT
CANISTER SUCT 3000ML PPV (MISCELLANEOUS) ×4 IMPLANT
CANISTER WOUNDNEG PRESSURE 500 (CANNISTER) ×4 IMPLANT
CANNULA NON VENT 20FR 12 (CANNULA) ×4 IMPLANT
CATH CPB KIT HENDRICKSON (MISCELLANEOUS) ×4 IMPLANT
CATH ROBINSON RED A/P 18FR (CATHETERS) ×8 IMPLANT
CLIP APPLIE 9.375 SM OPEN (CLIP) IMPLANT
CLIP RETRACTION 3.0MM CORONARY (MISCELLANEOUS) ×4 IMPLANT
CLIP VESOCCLUDE MED 24/CT (CLIP) IMPLANT
CLIP VESOCCLUDE SM WIDE 24/CT (CLIP) ×12 IMPLANT
CONNECTOR BLAKE 2:1 CARIO BLK (MISCELLANEOUS) ×4 IMPLANT
CONNECTOR Y ATS VAC SYSTEM (MISCELLANEOUS) ×4 IMPLANT
COVER MAYO STAND STRL (DRAPES) ×4 IMPLANT
DERMABOND ADVANCED (GAUZE/BANDAGES/DRESSINGS) ×1
DERMABOND ADVANCED .7 DNX12 (GAUZE/BANDAGES/DRESSINGS) ×3 IMPLANT
DRAIN CHANNEL 28F RND 3/8 FF (WOUND CARE) ×12 IMPLANT
DRAPE CARDIOVASCULAR INCISE (DRAPES) ×4
DRAPE EXTREMITY T 121X128X90 (DISPOSABLE) ×8 IMPLANT
DRAPE HALF SHEET 40X57 (DRAPES) ×8 IMPLANT
DRAPE SLUSH/WARMER DISC (DRAPES) ×4 IMPLANT
DRAPE SRG 135X102X78XABS (DRAPES) ×3 IMPLANT
DRESSING PREVENA PLUS CUSTOM (GAUZE/BANDAGES/DRESSINGS) ×3 IMPLANT
DRSG AQUACEL AG ADV 3.5X14 (GAUZE/BANDAGES/DRESSINGS) ×4 IMPLANT
DRSG PREVENA PLUS CUSTOM (GAUZE/BANDAGES/DRESSINGS) ×4
ELECT BLADE 4.0 EZ CLEAN MEGAD (MISCELLANEOUS) ×4
ELECT CAUTERY BLADE 6.4 (BLADE) ×4 IMPLANT
ELECT REM PT RETURN 9FT ADLT (ELECTROSURGICAL) ×8
ELECTRODE BLDE 4.0 EZ CLN MEGD (MISCELLANEOUS) ×3 IMPLANT
ELECTRODE REM PT RTRN 9FT ADLT (ELECTROSURGICAL) ×6 IMPLANT
FELT TEFLON 1X6 (MISCELLANEOUS) ×4 IMPLANT
GAUZE SPONGE 4X4 12PLY STRL (GAUZE/BANDAGES/DRESSINGS) ×8 IMPLANT
GAUZE SPONGE 4X4 12PLY STRL LF (GAUZE/BANDAGES/DRESSINGS) ×8 IMPLANT
GLOVE BIO SURGEON STRL SZ 6 (GLOVE) ×12 IMPLANT
GLOVE BIO SURGEON STRL SZ 6.5 (GLOVE) ×20 IMPLANT
GLOVE BIOGEL PI IND STRL 6 (GLOVE) ×6 IMPLANT
GLOVE BIOGEL PI IND STRL 6.5 (GLOVE) ×6 IMPLANT
GLOVE BIOGEL PI IND STRL 7.5 (GLOVE) ×9 IMPLANT
GLOVE BIOGEL PI INDICATOR 6 (GLOVE) ×2
GLOVE BIOGEL PI INDICATOR 6.5 (GLOVE) ×2
GLOVE BIOGEL PI INDICATOR 7.5 (GLOVE) ×3
GLOVE NEODERM STRL 7.5  LF PF (GLOVE) ×18
GLOVE NEODERM STRL 7.5 LF PF (GLOVE) ×18 IMPLANT
GLOVE SURG NEODERM 7.5  LF PF (GLOVE) ×6
GOWN STRL REUS W/ TWL LRG LVL3 (GOWN DISPOSABLE) ×42 IMPLANT
GOWN STRL REUS W/TWL LRG LVL3 (GOWN DISPOSABLE) ×56
HEMOSTAT POWDER SURGIFOAM 1G (HEMOSTASIS) ×8 IMPLANT
INSERT FOGARTY XLG (MISCELLANEOUS) ×4 IMPLANT
IV ADAPTER SYR DOUBLE MALE LL (MISCELLANEOUS) ×4 IMPLANT
KIT BASIN OR (CUSTOM PROCEDURE TRAY) ×4 IMPLANT
KIT MAXAN 1 LEVEL (KITS) ×4 IMPLANT
KIT SUCTION CATH 14FR (SUCTIONS) ×8 IMPLANT
KIT TURNOVER KIT B (KITS) ×4 IMPLANT
KIT VASOVIEW HEMOPRO 2 VH 4000 (KITS) ×4 IMPLANT
MARKER GRAFT CORONARY BYPASS (MISCELLANEOUS) ×8 IMPLANT
NEEDLE 18GX1X1/2 (RX/OR ONLY) (NEEDLE) ×8 IMPLANT
NS IRRIG 1000ML POUR BTL (IV SOLUTION) ×44 IMPLANT
PACK E OPEN HEART (SUTURE) ×4 IMPLANT
PACK OPEN HEART (CUSTOM PROCEDURE TRAY) ×4 IMPLANT
PACK SPY-PHI (KITS) ×4 IMPLANT
PAD ARMBOARD 7.5X6 YLW CONV (MISCELLANEOUS) ×8 IMPLANT
PAD ELECT DEFIB RADIOL ZOLL (MISCELLANEOUS) ×4 IMPLANT
PENCIL BUTTON HOLSTER BLD 10FT (ELECTRODE) ×8 IMPLANT
PLATE STERNAL 2.3X208 14H 2-PK (Plate) ×4 IMPLANT
POSITIONER HEAD DONUT 9IN (MISCELLANEOUS) ×4 IMPLANT
POWDER SURGICEL 3.0 GRAM (HEMOSTASIS) ×4 IMPLANT
PUNCH AORTIC ROT 4.0MM RCL 40 (MISCELLANEOUS) ×4 IMPLANT
SCREW LOCKING TI 2.3X11MM (Screw) ×8 IMPLANT
SCREW LOCKING TI 2.3X13MM (Screw) ×4 IMPLANT
SCREW STERNAL LOCK 2.3MM (Screw) ×28 IMPLANT
SEALANT SURG COSEAL 8ML (VASCULAR PRODUCTS) ×4 IMPLANT
SET CARDIOPLEGIA MPS 5001102 (MISCELLANEOUS) ×4 IMPLANT
SHEARS HARMONIC 9CM CVD (BLADE) ×4 IMPLANT
SPONGE LAP 18X18 RF (DISPOSABLE) ×4 IMPLANT
STAPLER VISISTAT 35W (STAPLE) ×4 IMPLANT
SUPPORT HEART JANKE-BARRON (MISCELLANEOUS) ×4 IMPLANT
SUT BONE WAX W31G (SUTURE) ×4 IMPLANT
SUT ETHIBOND X763 2 0 SH 1 (SUTURE) ×8 IMPLANT
SUT MNCRL AB 3-0 PS2 18 (SUTURE) ×12 IMPLANT
SUT PDS AB 1 CTX 36 (SUTURE) ×8 IMPLANT
SUT PROLENE 3 0 SH DA (SUTURE) ×8 IMPLANT
SUT PROLENE 4 0 SH DA (SUTURE) ×4 IMPLANT
SUT PROLENE 5 0 C 1 36 (SUTURE) IMPLANT
SUT PROLENE 6 0 C 1 30 (SUTURE) ×4 IMPLANT
SUT PROLENE 7 0 BV1 MDA (SUTURE) ×4 IMPLANT
SUT PROLENE 8 0 BV175 6 (SUTURE) ×12 IMPLANT
SUT PROLENE BLUE 7 0 (SUTURE) ×4 IMPLANT
SUT SILK  1 MH (SUTURE) ×4
SUT SILK 1 MH (SUTURE) ×3 IMPLANT
SUT SILK 2 0 SH CR/8 (SUTURE) IMPLANT
SUT SILK 3 0 SH CR/8 (SUTURE) IMPLANT
SUT STEEL 6MS V (SUTURE) ×4 IMPLANT
SUT STEEL SZ 6 DBL 3X14 BALL (SUTURE) ×4 IMPLANT
SUT VIC AB 1 CTX 36 (SUTURE) ×8
SUT VIC AB 1 CTX36XBRD ANBCTR (SUTURE) ×6 IMPLANT
SUT VIC AB 2-0 CT1 27 (SUTURE) ×12
SUT VIC AB 2-0 CT1 TAPERPNT 27 (SUTURE) ×9 IMPLANT
SUT VIC AB 2-0 CTX 27 (SUTURE) IMPLANT
SUT VIC AB 3-0 SH 27 (SUTURE)
SUT VIC AB 3-0 SH 27X BRD (SUTURE) IMPLANT
SUT VIC AB 3-0 X1 27 (SUTURE) IMPLANT
SYR 10ML LL (SYRINGE) ×8 IMPLANT
SYR 30ML LL (SYRINGE) ×4 IMPLANT
SYR 3ML LL SCALE MARK (SYRINGE) ×4 IMPLANT
SYR 50ML SLIP (SYRINGE) IMPLANT
SYSTEM SAHARA CHEST DRAIN ATS (WOUND CARE) ×4 IMPLANT
TOWEL GREEN STERILE (TOWEL DISPOSABLE) ×4 IMPLANT
TOWEL GREEN STERILE FF (TOWEL DISPOSABLE) IMPLANT
TRAY FOLEY SLVR 16FR TEMP STAT (SET/KITS/TRAYS/PACK) ×4 IMPLANT
TUBING ART PRESS 48 MALE/FEM (TUBING) ×8 IMPLANT
TUBING LAP HI FLOW INSUFFLATIO (TUBING) IMPLANT
UNDERPAD 30X30 (UNDERPADS AND DIAPERS) ×8 IMPLANT
WATER STERILE IRR 1000ML POUR (IV SOLUTION) ×8 IMPLANT
WATER STERILE IRR 1000ML UROMA (IV SOLUTION) ×4 IMPLANT

## 2020-03-06 NOTE — Anesthesia Procedure Notes (Signed)
Arterial Line Insertion Start/End4/14/2021 7:40 AM, 03/06/2020 8:00 AM Performed by: Imagene Riches, CRNA, CRNA  Preanesthetic checklist: patient identified, IV checked, site marked, risks and benefits discussed, surgical consent, monitors and equipment checked, pre-op evaluation, timeout performed and anesthesia consent Patient sedated Right, radial was placed Catheter size: 20 G Hand hygiene performed  and maximum sterile barriers used  Allen's test indicative of satisfactory collateral circulation Attempts: 1 Procedure performed without using ultrasound guided technique. Following insertion, dressing applied and Biopatch. Post procedure assessment: normal  Patient tolerated the procedure well with no immediate complications.

## 2020-03-06 NOTE — H&P (Signed)
History and Physical Interval Note:  03/06/2020 7:44 AM  Rhonda Roberts  has presented today for surgery, with the diagnosis of CAD.  The various methods of treatment have been discussed with the patient and family. After consideration of risks, benefits and other options for treatment, the patient has consented to  Procedure(s) with comments: CORONARY ARTERY BYPASS GRAFTING (CABG) (N/A) - BILATERAL IMA RADIAL ARTERY HARVEST (Left) TRANSESOPHAGEAL ECHOCARDIOGRAM (TEE) (N/A) INDOCYANINE GREEN FLUORESCENCE IMAGING (ICG) (N/A) as a surgical intervention.  The patient's history has been reviewed, patient examined, no change in status, stable for surgery.  I have reviewed the patient's chart and labs.  Questions were answered to the patient's satisfaction.     Wonda Olds

## 2020-03-06 NOTE — Progress Notes (Signed)
EVENING ROUNDS NOTE :     [address]       [place] [address]             [phone]                 Day of Surgery Procedure(s) (LRB): CORONARY ARTERY BYPASS GRAFTING (CABG) using LIMA to LAD, RIMA to PLB, Left Radial Artery Graft: RAG to Diag1; RAG to OM1. (N/A) RADIAL ARTERY HARVEST (Left) TRANSESOPHAGEAL ECHOCARDIOGRAM (TEE) (N/A) INDOCYANINE GREEN FLUORESCENCE IMAGING (ICG) (N/A)   Total Length of Stay:  LOS: 4 days  Events:   Minimal CT output Good hemodynamics     BP (!) 112/58   Pulse 84   Temp 98.9 F (37.2 C) (Oral)   Resp (!) 21   Ht 5\' 2"  (1.575 m)   Wt 136 kg   SpO2 92%   BMI 54.84 kg/m   CVP:  [8 mmHg-9 mmHg] 8 mmHg  Vent Mode: PRVC FiO2 (%):  [100 %] 100 % Set Rate:  [12 bmp] 12 bmp Vt Set:  [500 mL] 500 mL PEEP:  [8 cmH20] 8 cmH20 Plateau Pressure:  [21 cmH20] 21 cmH20  . sodium chloride 10 mL/hr at 03/06/20 1650  . [START ON 03/07/2020] sodium chloride    . sodium chloride 20 mL/hr at 03/06/20 1650  . albumin human 12.5 g (03/06/20 1725)  . cefTRIAXone (ROCEPHIN)  IV Stopped (03/05/20 1404)  . cefUROXime (ZINACEF)  IV    . dexmedetomidine (PRECEDEX) IV infusion 0.7 mcg/kg/hr (03/06/20 1655)  . epinephrine    . famotidine (PEPCID) IV    . insulin 2.4 Units/hr (03/06/20 1714)  . lactated ringers    . lactated ringers    . lactated ringers 20 mL/hr at 03/06/20 1713  . magnesium sulfate 4 g (03/06/20 1708)  . milrinone    . nitroGLYCERIN 5 mcg/min (03/06/20 1650)  . norepinephrine (LEVOPHED) Adult infusion    . phenylephrine (NEO-SYNEPHRINE) Adult infusion    . potassium chloride    . vancomycin    . vasopressin (PITRESSIN) infusion - *FOR SHOCK*      I/O last 3 completed shifts: In: 1494.7 [P.O.:720; I.V.:658.9; IV Piggyback:115.8] Out: 2120 [Urine:2120]   CBC Latest Ref Rng & Units 03/06/2020 03/06/2020 03/06/2020  WBC 4.0 - 10.5 K/uL 18.3(H) - -  Hemoglobin 12.0 - 15.0 g/dL 11.1(L) 10.2(L) 7.5(L)  Hematocrit  36.0 - 46.0 % 33.4(L) 30.0(L) 22.0(L)  Platelets 150 - 400 K/uL 159 - -    BMP Latest Ref Rng & Units 03/06/2020 03/06/2020 03/06/2020  Glucose 70 - 99 mg/dL - - 232(H)  BUN 8 - 23 mg/dL - - 25(H)  Creatinine 0.44 - 1.00 mg/dL - - 1.00  Sodium 135 - 145 mmol/L 138 138 137  Potassium 3.5 - 5.1 mmol/L 4.3 4.6 5.0  Chloride 98 - 111 mmol/L - - 104  CO2 22 - 32 mmol/L - - -  Calcium 8.9 - 10.3 mg/dL - - -    ABG    Component Value Date/Time   PHART 7.428 03/06/2020 1537   PCO2ART 38.6 03/06/2020 1537   PO2ART 301.0 (H) 03/06/2020 1537   HCO3 25.5 03/06/2020 1537   TCO2 27 03/06/2020 1537   O2SAT 100.0 03/06/2020 [place], MD 03/06/2020 5:34 PM

## 2020-03-06 NOTE — Anesthesia Postprocedure Evaluation (Signed)
Anesthesia Post Note  Patient: Rhonda Roberts  Procedure(s) Performed: CORONARY ARTERY BYPASS GRAFTING (CABG) using LIMA to LAD, RIMA to PLB, Left Radial Artery Graft: RAG to Diag1; RAG to OM1. (N/A Chest) RADIAL ARTERY HARVEST (Left Arm Lower) TRANSESOPHAGEAL ECHOCARDIOGRAM (TEE) (N/A ) INDOCYANINE GREEN FLUORESCENCE IMAGING (ICG) (N/A )     Patient location during evaluation: SICU Anesthesia Type: General Level of consciousness: sedated and patient remains intubated per anesthesia plan Pain management: pain level controlled Vital Signs Assessment: post-procedure vital signs reviewed and stable Respiratory status: patient remains intubated per anesthesia plan and patient on ventilator - see flowsheet for VS Cardiovascular status: stable Anesthetic complications: no    Last Vitals:  Vitals:   03/06/20 1658 03/06/20 1700  BP:  (!) 112/58  Pulse:    Resp:    Temp:    SpO2: 92%     Last Pain:  Vitals:   03/06/20 0605  TempSrc:   PainSc: 0-No pain                 Nolon Nations

## 2020-03-06 NOTE — Anesthesia Procedure Notes (Signed)
Central Venous Catheter Insertion Performed by: Oleta Mouse, MD, anesthesiologist Start/End4/14/2021 7:42 AM, 03/06/2020 7:52 AM Patient location: Pre-op. Preanesthetic checklist: patient identified, IV checked, site marked, risks and benefits discussed, surgical consent, monitors and equipment checked, pre-op evaluation, timeout performed and anesthesia consent Hand hygiene performed  and maximum sterile barriers used  PA cath was placed.Swan type:thermodilution Procedure performed without using ultrasound guided technique. Attempts: 1 Patient tolerated the procedure well with no immediate complications.

## 2020-03-06 NOTE — Progress Notes (Signed)
Patient blood sugar 91 tonight.Patient scheduled to received 40 units of NPH insulin.Patient verbalized that at home if blood sugar less than 150 she holds night time dose.Patient will be NPO after midnight for surgery in morning.Bedtime snack offered to patient and given.Spoke with Dr.Atkins okay with holding night time dose of insulin tonight.

## 2020-03-06 NOTE — Brief Op Note (Addendum)
03/01/2020 - 03/06/2020  2:03 PM  PATIENT:  Rhonda Roberts  63 y.o. female  PRE-OPERATIVE DIAGNOSIS:  1. S/p NSTEMI 2.CORONARY ARTERY DISEASE  POST-OPERATIVE DIAGNOSIS:  1. S/p NSTEMI 2. CORONARY ARTERY DISEASE  PROCEDURE:  TRANSESOPHAGEAL ECHOCARDIOGRAM (TEE), CORONARY ARTERY BYPASS GRAFTING (CABG) x 4 (LIMA to LAD, LEFT RADIALLY SEQUENTIALLY to DIAGONAL and DISTAL OM, and RIMA to PDA) with BILATERAL IMA and LEFT RADIAL ARTERY HARVEST, INDOCYANINE GREEN FLUORESCENCE IMAGING (ICG)   SURGEON:  Surgeon(s) and Role:    Wonda Olds, MD - Primary  PHYSICIAN ASSISTANTS: 1. Erin Barrett PA-C 2. Lars Pinks PA-C  ASSISTANTS: Ara Kussmaul RNFA  ANESTHESIA:   general  EBL:  Per anesthesia and perfusion record  DRAINS: Chest tubes placed in the mediastinal and pleural spaces   COUNTS CORRECT:  YES  DICTATION: .Dragon Dictation  PLAN OF CARE: Admit to inpatient   PATIENT DISPOSITION:  ICU - intubated and hemodynamically stable.   Delay start of Pharmacological VTE agent (>24hrs) due to surgical blood loss or risk of bleeding: yes  BASELINE WEIGHT: 136 kg

## 2020-03-06 NOTE — Brief Op Note (Signed)
03/01/2020 - 03/06/2020  7:56 AM  PATIENT:  Rhonda Roberts  65 y.o. female  PRE-OPERATIVE DIAGNOSIS:  CORONARY ARTERY DISEASE  POST-OPERATIVE DIAGNOSIS:  CORONARY ARTERY DISEASE  PROCEDURE:  Procedure(s) with comments:  CORONARY ARTERY BYPASS GRAFTING x 4 -LIMA to LAD -RIMA to DISTAL RCA - SEQUENTIAL RADIAL ARTERY TO OM 2 and DIAGONAL  OPEN RADIAL ARTERY HARVEST (Left)  TRANSESOPHAGEAL ECHOCARDIOGRAM (TEE) (N/A) INDOCYANINE GREEN FLUORESCENCE IMAGING (ICG) (N/A)  SURGEON:  Surgeon(s) and Role:    * Wonda Olds, MD - Primary  PHYSICIAN ASSISTANT: Ellwood Handler PA-C, Lars Pinks PA-C  ANESTHESIA:   general  EBL:  1550 mL   BLOOD ADMINISTERED: CELLSAVER  DRAINS: Left and Right pleural chest tubes, mediastinal Chest drains   LOCAL MEDICATIONS USED:  NONE  SPECIMEN:  No Specimen  DISPOSITION OF SPECIMEN:  N/A  COUNTS:  YES  TOURNIQUET:  * No tourniquets in log *  DICTATION: .Dragon Dictation  PLAN OF CARE: Admit to inpatient   PATIENT DISPOSITION:  ICU - intubated and hemodynamically stable.   Delay start of Pharmacological VTE agent (>24hrs) due to surgical blood loss or risk of bleeding: yes

## 2020-03-06 NOTE — Anesthesia Procedure Notes (Signed)
Procedure Name: Intubation Date/Time: 03/06/2020 8:43 AM Performed by: Imagene Riches, CRNA Pre-anesthesia Checklist: Patient identified, Emergency Drugs available, Suction available and Patient being monitored Patient Re-evaluated:Patient Re-evaluated prior to induction Oxygen Delivery Method: Circle System Utilized Preoxygenation: Pre-oxygenation with 100% oxygen Induction Type: IV induction Ventilation: Two handed mask ventilation required, Mask ventilation with difficulty and Oral airway inserted - appropriate to patient size Laryngoscope Size: Sabra Heck and 2 Grade View: Grade I Tube type: Oral Tube size: 8.0 mm Number of attempts: 1 Airway Equipment and Method: Stylet and Oral airway Placement Confirmation: ETT inserted through vocal cords under direct vision,  positive ETCO2 and breath sounds checked- equal and bilateral Secured at: 21 cm Tube secured with: Tape Dental Injury: Teeth and Oropharynx as per pre-operative assessment

## 2020-03-06 NOTE — Anesthesia Procedure Notes (Signed)
Central Venous Catheter Insertion Performed by: Oleta Mouse, MD, anesthesiologist Start/End4/14/2021 7:42 AM, 03/06/2020 7:52 AM Patient location: Pre-op. Preanesthetic checklist: patient identified, IV checked, site marked, risks and benefits discussed, surgical consent, monitors and equipment checked, pre-op evaluation, timeout performed and anesthesia consent Position: supine Lidocaine 1% used for infiltration and patient sedated Hand hygiene performed  and maximum sterile barriers used  Catheter size: 9 Fr Total catheter length 10. MAC introducer Procedure performed using ultrasound guided technique. Ultrasound Notes:anatomy identified, needle tip was noted to be adjacent to the nerve/plexus identified, no ultrasound evidence of intravascular and/or intraneural injection and image(s) printed for medical record Attempts: 1 Following insertion, line sutured, dressing applied and Biopatch. Post procedure assessment: blood return through all ports, free fluid flow and no air  Patient tolerated the procedure well with no immediate complications.

## 2020-03-06 NOTE — Progress Notes (Signed)
ANTICOAGULATION CONSULT NOTE   Pharmacy Consult for Heparin Indication: CAD  Patient Measurements: Height: 5\' 2"  (157.5 cm) Weight: 136 kg (299 lb 13.2 oz) IBW/kg (Calculated) : 50.1 Heparin Dosing Weight: 84.5 kg  Vital Signs: Temp: 98.9 F (37.2 C) (04/14 0349) Temp Source: Oral (04/14 0349) BP: 136/64 (04/14 0605) Pulse Rate: 86 (04/14 0605)  Labs: Recent Labs    03/03/20 1236 03/03/20 1236 03/04/20 0408 03/04/20 0408 03/05/20 0540 03/05/20 0540 03/06/20 0402 03/06/20 0415  HGB 11.9*   < > 11.4*   < > 11.2*   < > 11.9* 11.0*  HCT 36.8   < > 34.7*   < > 34.5*  --  35.0* 33.7*  PLT  --   --  266  --  274  --   --  251  HEPARINUNFRC  --   --  0.62  --  0.44  0.45  --   --  0.41  CREATININE  --   --  1.32*  --  1.46*  --   --  1.24*  TROPONINIHS 8,166*  --   --   --   --   --   --   --    < > = values in this interval not displayed.    Estimated Creatinine Clearance: 61.1 mL/min (A) (by C-G formula based on SCr of 1.24 mg/dL (H)).   Assessment: [name] started on heparin for ACS. Rivaroxaban noted on prior med rec, but per current med rec patient is not taking d/t money/insurance issues.    Now s/p cath with multivessel CAD, TCTS planning CABG for 4/14.  Pharmacy has been asked to continue heparin  HL 0.41 at goal. Reportedly, patient had blood in stools before admission but none since being here. CBC stable.    Goal of Therapy:  Heparin level 0.3-0.7 units/ml Monitor platelets by anticoagulation protocol: Yes   Plan:  Continue heparin at 1250 units/hr Monitor daily heparin level while on IV heparin, CBC/Pltc Monitor for signs/symptoms of bleeding    Vertis Kelch, PharmD, [facility] PGY2 Cardiology Pharmacy Resident Phone [phone] 03/06/2020       7:05 AM  Please check [email or web address] for unit-specific pharmacist phone numbers

## 2020-03-06 NOTE — Transfer of Care (Signed)
Immediate Anesthesia Transfer of Care Note  Patient: Rhonda Roberts  Procedure(s) Performed: CORONARY ARTERY BYPASS GRAFTING (CABG) using LIMA to LAD, RIMA to PLB, Left Radial Artery Graft: RAG to Diag1; RAG to OM1. (N/A Chest) RADIAL ARTERY HARVEST (Left Arm Lower) TRANSESOPHAGEAL ECHOCARDIOGRAM (TEE) (N/A ) INDOCYANINE GREEN FLUORESCENCE IMAGING (ICG) (N/A )  Patient Location: ICU  Anesthesia Type:General  Level of Consciousness: sedated and Patient remains intubated per anesthesia plan  Airway & Oxygen Therapy: Patient remains intubated per anesthesia plan and Patient placed on Ventilator (see vital sign flow sheet for setting)  Post-op Assessment: Report given to RN and Post -op Vital signs reviewed and stable  Post vital signs: Reviewed and stable  Last Vitals:  Vitals Value Taken Time  BP    Temp    Pulse    Resp    SpO2      Last Pain:  Vitals:   03/06/20 0605  TempSrc:   PainSc: 0-No pain      Patients Stated Pain Goal: 0 ([id number])  Complications: No apparent anesthesia complications

## 2020-03-07 ENCOUNTER — Inpatient Hospital Stay (HOSPITAL_COMMUNITY): Payer: Self-pay

## 2020-03-07 DIAGNOSIS — Z951 Presence of aortocoronary bypass graft: Secondary | ICD-10-CM

## 2020-03-07 LAB — CBC
HCT: 27 % — ABNORMAL LOW (ref 36.0–46.0)
HCT: 25.8 % — ABNORMAL LOW (ref 36.0–46.0)
Hemoglobin: 8.9 g/dL — ABNORMAL LOW (ref 12.0–15.0)
Hemoglobin: 8.4 g/dL — ABNORMAL LOW (ref 12.0–15.0)
MCH: 28.8 pg (ref 26.0–34.0)
MCH: 28.9 pg (ref 26.0–34.0)
MCHC: 33 g/dL (ref 30.0–36.0)
MCHC: 32.6 g/dL (ref 30.0–36.0)
MCV: 87.4 fL (ref 80.0–100.0)
MCV: 88.7 fL (ref 80.0–100.0)
Platelets: 142 10*3/uL — ABNORMAL LOW (ref 150–400)
Platelets: 146 10*3/uL — ABNORMAL LOW (ref 150–400)
RBC: 3.09 MIL/uL — ABNORMAL LOW (ref 3.87–5.11)
RBC: 2.91 MIL/uL — ABNORMAL LOW (ref 3.87–5.11)
RDW: 14.7 % (ref 11.5–15.5)
RDW: 14.9 % (ref 11.5–15.5)
WBC: 12.9 10*3/uL — ABNORMAL HIGH (ref 4.0–10.5)
WBC: 14.9 10*3/uL — ABNORMAL HIGH (ref 4.0–10.5)
nRBC: 0 % (ref 0.0–0.2)
nRBC: 0 % (ref 0.0–0.2)

## 2020-03-07 LAB — BASIC METABOLIC PANEL
Anion gap: 11 (ref 5–15)
Anion gap: 10 (ref 5–15)
Anion gap: 9 (ref 5–15)
BUN: 25 mg/dL — ABNORMAL HIGH (ref 8–23)
BUN: 25 mg/dL — ABNORMAL HIGH (ref 8–23)
BUN: 28 mg/dL — ABNORMAL HIGH (ref 8–23)
CO2: 19 mmol/L — ABNORMAL LOW (ref 22–32)
CO2: 19 mmol/L — ABNORMAL LOW (ref 22–32)
CO2: 21 mmol/L — ABNORMAL LOW (ref 22–32)
Calcium: 8.4 mg/dL — ABNORMAL LOW (ref 8.9–10.3)
Calcium: 8.4 mg/dL — ABNORMAL LOW (ref 8.9–10.3)
Calcium: 8.2 mg/dL — ABNORMAL LOW (ref 8.9–10.3)
Chloride: 108 mmol/L (ref 98–111)
Chloride: 109 mmol/L (ref 98–111)
Chloride: 108 mmol/L (ref 98–111)
Creatinine, Ser: 1.34 mg/dL — ABNORMAL HIGH (ref 0.44–1.00)
Creatinine, Ser: 1.38 mg/dL — ABNORMAL HIGH (ref 0.44–1.00)
Creatinine, Ser: 1.21 mg/dL — ABNORMAL HIGH (ref 0.44–1.00)
GFR calc Af Amer: 48 mL/min — ABNORMAL LOW (ref >60)
GFR calc Af Amer: 47 mL/min — ABNORMAL LOW (ref >60)
GFR calc Af Amer: 55 mL/min — ABNORMAL LOW (ref >60)
GFR calc non Af Amer: 42 mL/min — ABNORMAL LOW (ref >60)
GFR calc non Af Amer: 40 mL/min — ABNORMAL LOW (ref >60)
GFR calc non Af Amer: 47 mL/min — ABNORMAL LOW (ref >60)
Glucose, Bld: 235 mg/dL — ABNORMAL HIGH (ref 70–99)
Glucose, Bld: 184 mg/dL — ABNORMAL HIGH (ref 70–99)
Glucose, Bld: 163 mg/dL — ABNORMAL HIGH (ref 70–99)
Potassium: 4.7 mmol/L (ref 3.5–5.1)
Potassium: 4.3 mmol/L (ref 3.5–5.1)
Potassium: 4.2 mmol/L (ref 3.5–5.1)
Sodium: 138 mmol/L (ref 135–145)
Sodium: 138 mmol/L (ref 135–145)
Sodium: 138 mmol/L (ref 135–145)

## 2020-03-07 LAB — POCT I-STAT 7, (LYTES, BLD GAS, ICA,H+H)
Acid-base deficit: 4 mmol/L — ABNORMAL HIGH (ref 0.0–2.0)
Bicarbonate: 20.7 mmol/L (ref 20.0–28.0)
Calcium, Ion: 1.16 mmol/L (ref 1.15–1.40)
HCT: 24 % — ABNORMAL LOW (ref 36.0–46.0)
Hemoglobin: 8.2 g/dL — ABNORMAL LOW (ref 12.0–15.0)
O2 Saturation: 98 %
Patient temperature: 36.1
Potassium: 3.9 mmol/L (ref 3.5–5.1)
Sodium: 140 mmol/L (ref 135–145)
TCO2: 22 mmol/L (ref 22–32)
pCO2 arterial: 32.4 mmHg (ref 32.0–48.0)
pH, Arterial: 7.41 (ref 7.350–7.450)
pO2, Arterial: 106 mmHg (ref 83.0–108.0)

## 2020-03-07 LAB — GLUCOSE, CAPILLARY
Glucose-Capillary: 106 mg/dL — ABNORMAL HIGH (ref 70–99)
Glucose-Capillary: 124 mg/dL — ABNORMAL HIGH (ref 70–99)
Glucose-Capillary: 129 mg/dL — ABNORMAL HIGH (ref 70–99)
Glucose-Capillary: 141 mg/dL — ABNORMAL HIGH (ref 70–99)
Glucose-Capillary: 143 mg/dL — ABNORMAL HIGH (ref 70–99)
Glucose-Capillary: 153 mg/dL — ABNORMAL HIGH (ref 70–99)
Glucose-Capillary: 156 mg/dL — ABNORMAL HIGH (ref 70–99)
Glucose-Capillary: 160 mg/dL — ABNORMAL HIGH (ref 70–99)
Glucose-Capillary: 162 mg/dL — ABNORMAL HIGH (ref 70–99)
Glucose-Capillary: 166 mg/dL — ABNORMAL HIGH (ref 70–99)
Glucose-Capillary: 196 mg/dL — ABNORMAL HIGH (ref 70–99)
Glucose-Capillary: 197 mg/dL — ABNORMAL HIGH (ref 70–99)
Glucose-Capillary: 223 mg/dL — ABNORMAL HIGH (ref 70–99)
Glucose-Capillary: 255 mg/dL — ABNORMAL HIGH (ref 70–99)
Glucose-Capillary: 77 mg/dL (ref 70–99)

## 2020-03-07 LAB — COOXEMETRY PANEL
Carboxyhemoglobin: 0.6 % (ref 0.5–1.5)
Methemoglobin: 0.8 % (ref 0.0–1.5)
O2 Saturation: 78.4 %
Total hemoglobin: 8.4 g/dL — ABNORMAL LOW (ref 12.0–16.0)

## 2020-03-07 LAB — URINE CULTURE: Culture: >=100000 — AB

## 2020-03-07 LAB — MAGNESIUM
Magnesium: 2.8 mg/dL — ABNORMAL HIGH (ref 1.7–2.4)
Magnesium: 2.7 mg/dL — ABNORMAL HIGH (ref 1.7–2.4)
Magnesium: 2.9 mg/dL — ABNORMAL HIGH (ref 1.7–2.4)

## 2020-03-07 MED ORDER — ISOSORBIDE DINITRATE 10 MG PO TABS
5.0000 mg | ORAL_TABLET | Freq: Three times a day (TID) | ORAL | Status: DC
  Administered 2020-03-07 (×2): 5 mg via ORAL
  Filled 2020-03-07 (×2): qty 1

## 2020-03-07 MED ORDER — THIAMINE HCL 100 MG/ML IJ SOLN
Freq: Once | INTRAVENOUS | Status: AC
  Filled 2020-03-07: qty 1000

## 2020-03-07 MED ORDER — CLOPIDOGREL BISULFATE 75 MG PO TABS
75.0000 mg | ORAL_TABLET | Freq: Every day | ORAL | Status: DC
  Administered 2020-03-08 – 2020-03-15 (×8): 75 mg via ORAL
  Filled 2020-03-07 (×8): qty 1

## 2020-03-07 MED ORDER — WHITE PETROLATUM EX OINT
TOPICAL_OINTMENT | CUTANEOUS | Status: AC
  Administered 2020-03-07: 0.2
  Filled 2020-03-07: qty 28.35

## 2020-03-07 MED ORDER — INSULIN DETEMIR 100 UNIT/ML ~~LOC~~ SOLN
25.0000 [IU] | Freq: Two times a day (BID) | SUBCUTANEOUS | Status: DC
  Administered 2020-03-07 – 2020-03-08 (×2): 25 [IU] via SUBCUTANEOUS
  Filled 2020-03-07 (×3): qty 0.25

## 2020-03-07 MED ORDER — ASPIRIN EC 81 MG PO TBEC
81.0000 mg | DELAYED_RELEASE_TABLET | Freq: Every day | ORAL | Status: DC
  Administered 2020-03-08 – 2020-03-15 (×8): 81 mg via ORAL
  Filled 2020-03-07 (×8): qty 1

## 2020-03-07 MED ORDER — INSULIN ASPART 100 UNIT/ML ~~LOC~~ SOLN
0.0000 [IU] | SUBCUTANEOUS | Status: DC
  Administered 2020-03-07 – 2020-03-08 (×2): 11 [IU] via SUBCUTANEOUS
  Administered 2020-03-08 (×2): 7 [IU] via SUBCUTANEOUS
  Administered 2020-03-08: 12:00:00 4 [IU] via SUBCUTANEOUS
  Administered 2020-03-08: 7 [IU] via SUBCUTANEOUS
  Administered 2020-03-08: 01:00:00 11 [IU] via SUBCUTANEOUS
  Administered 2020-03-08 – 2020-03-09 (×2): 4 [IU] via SUBCUTANEOUS
  Administered 2020-03-09 (×2): 3 [IU] via SUBCUTANEOUS
  Administered 2020-03-09: 4 [IU] via SUBCUTANEOUS
  Administered 2020-03-09: 3 [IU] via SUBCUTANEOUS

## 2020-03-07 MED ORDER — SODIUM CHLORIDE 0.9% FLUSH
10.0000 mL | Freq: Two times a day (BID) | INTRAVENOUS | Status: DC
  Administered 2020-03-07 – 2020-03-08 (×3): 10 mL
  Administered 2020-03-08: 20 mL
  Administered 2020-03-09 – 2020-03-11 (×4): 10 mL
  Administered 2020-03-11: 30 mL
  Administered 2020-03-12 – 2020-03-13 (×3): 10 mL
  Administered 2020-03-13: 09:00:00 30 mL
  Administered 2020-03-14: 10 mL

## 2020-03-07 MED ORDER — CHLORHEXIDINE GLUCONATE 0.12% ORAL RINSE (MEDLINE KIT)
15.0000 mL | Freq: Two times a day (BID) | OROMUCOSAL | Status: DC
  Administered 2020-03-07: 09:00:00 15 mL via OROMUCOSAL

## 2020-03-07 MED ORDER — SODIUM CHLORIDE 0.9% FLUSH: 10.0000 mL | INTRAVENOUS | Status: DC | PRN

## 2020-03-07 MED ORDER — INSULIN DETEMIR 100 UNIT/ML ~~LOC~~ SOLN
25.0000 [IU] | Freq: Two times a day (BID) | SUBCUTANEOUS | Status: DC
  Filled 2020-03-07: qty 0.25

## 2020-03-07 MED ORDER — ORAL CARE MOUTH RINSE
15.0000 mL | OROMUCOSAL | Status: DC
  Administered 2020-03-07 (×7): 15 mL via OROMUCOSAL

## 2020-03-07 NOTE — Procedures (Addendum)
Extubation Procedure Note  Patient Details:   Name: Rhonda Roberts DOB: 1956-08-28 MRN: [id number]   Airway Documentation:    Vent end date: 03/07/20 Vent end time: 1340   Evaluation  O2 sats: stable throughout Complications: No apparent complications Patient did tolerate procedure well. Bilateral Breath Sounds: Clear, Diminished   Yes   VC .70 and NIF -60. Positive cuff leak. Now on 4 LPM nasal cannula, no complications. No stridor noted. RN at bedside.  Nitric weaned per MD Atkins order and ended at 12:40.  Jena Tegeler M 03/07/2020, 2:48 PM

## 2020-03-07 NOTE — Progress Notes (Addendum)
Advanced Heart Failure Rounding Note  PCP-Cardiologist: Skeet Latch, MD   Subjective:    S/P CABG x4  4/14  Remains intubated.   On Epi 4 mcg, milrinone 0.375 mcg, ntg 7 mcg, and vasopressin 0.02 units.   CVP 20 PA 37/22  CO 4.6 CI 2   Awake following commands.   Objective:   Weight Range: (!) 141.1 kg Body mass index is 56.9 kg/m.   Vital Signs:   Temp:  [97.2 F (36.2 C)-98.6 F (37 C)] 97.2 F (36.2 C) (04/15 0700) Pulse Rate:  [79-94] 90 (04/15 0700) Resp:  [0-27] 22 (04/15 0700) BP: (75-112)/(42-81) 104/50 (04/15 0700) SpO2:  [88 %-100 %] 98 % (04/15 0700) Arterial Line BP: (70-119)/(38-61) 108/60 (04/15 0700) FiO2 (%):  [40 %-100 %] 40 % (04/15 0400) Weight:  [141.1 kg] 141.1 kg (04/15 0400) Last BM Date: 03/05/20  Weight change: Filed Weights   03/04/20 0919 03/06/20 0500 03/07/20 0400  Weight: (!) 136.9 kg 136 kg (!) 141.1 kg    Intake/Output:   Intake/Output Summary (Last 24 hours) at 03/07/2020 0731 Last data filed at 03/07/2020 0700 Gross per 24 hour  Intake 6678.83 ml  Output 3245 ml  Net 3433.83 ml      Physical Exam  CVP 20  General:  Awake on vent  HEENT: normal Neck: supple.  Carotids 2+ bilat; no bruits. No lymphadenopathy or thryomegaly appreciated. Cor: PMI nondisplaced. Regular rate & rhythm. No rubs, gallops or murmurs. VAC dressing in place  Lungs: clear Abdomen: soft, nontender, nondistended. No hepatosplenomegaly. No bruits or masses. Good bowel sounds. Extremities: no cyanosis, clubbing, rash, edema Neuro: intubated. Awake on the vent. Following commands MAE x4.    Telemetry   A paced 90 bpm   EKG    N/a   Labs    CBC Recent Labs    03/06/20 2310 03/07/20 0422  WBC 11.7* 12.9*  HGB 9.0* 8.9*  HCT 27.7* 27.0*  MCV 88.8 87.4  PLT 136* 419*   Basic Metabolic Panel Recent Labs    03/06/20 2310 03/07/20 0422  NA 138 138  K 4.7 4.3  CL 108 109  CO2 19* 19*  GLUCOSE 235* 184*  BUN 25* 25*    CREATININE 1.34* 1.38*  CALCIUM 8.4* 8.4*  MG 2.8* 2.7*   Liver Function Tests No results for input(s): AST, ALT, ALKPHOS, BILITOT, PROT, ALBUMIN in the last 72 hours. No results for input(s): LIPASE, AMYLASE in the last 72 hours. Cardiac Enzymes No results for input(s): CKTOTAL, CKMB, CKMBINDEX, TROPONINI in the last 72 hours.  BNP: BNP (last 3 results) No results for input(s): BNP in the last 8760 hours.  ProBNP (last 3 results) No results for input(s): PROBNP in the last 8760 hours.   D-Dimer No results for input(s): DDIMER in the last 72 hours. Hemoglobin A1C No results for input(s): HGBA1C in the last 72 hours. Fasting Lipid Panel No results for input(s): CHOL, HDL, LDLCALC, TRIG, CHOLHDL, LDLDIRECT in the last 72 hours. Thyroid Function Tests Recent Labs    03/05/20 0540  TSH 0.870    Other results:   Imaging    DG Chest Port 1 View  Result Date: 03/06/2020 CLINICAL DATA:  64 year old female status post CABG postoperative day zero EXAM: PORTABLE CHEST 1 VIEW COMPARISON:  Chest radiographs and CT 03/01/2020, and earlier. FINDINGS: Portable AP semi upright view at 1731 hours. Intubated, endotracheal tube tip at the level the clavicles. An enteric tube courses to the abdomen, with side  hole at the level of the gastric cardia. Right IJ approach Swan-Ganz catheter in place with tip at the level of the right main pulmonary artery. Superimposed right subclavian or right upper extremity central line. Bilateral chest tubes. Mildly lower lung volumes. Stable cardiac size and mediastinal contours. Sequelae of CABG. No pneumothorax. Stable pulmonary vascularity without overt edema. No pleural effusion or consolidation. Paucity of bowel gas in the upper abdomen. IMPRESSION: 1. Status post CABG, lines and tubes appear appropriately placed as detailed above. 2. No pneumothorax or acute pulmonary process. Electronically Signed   By: Genevie Ann M.D.   On: 03/06/2020 17:58   ECHO  INTRAOPERATIVE TEE  Result Date: 03/06/2020  *INTRAOPERATIVE TRANSESOPHAGEAL REPORT *  Patient Name:   Rhonda Roberts Date of Exam: 03/06/2020 Medical Rec #:  [id number]         Height:       62.0 in Accession #:    [id number]        Weight:       299.8 lb Date of Birth:  02/09/1956         BSA:          2.27 m Patient Age:    64 years          BP:           140/68 mmHg Patient Gender: F                 HR:           84 bpm. Exam Location:  Anesthesiology Transesophogeal exam was perform intraoperatively during surgical procedure. Patient was closely monitored under general anesthesia during the entirety of examination. Indications:     CAD Performing Phys: [id number] BROADUS Z ATKINS Complications: No known complications during this procedure. POST-OP IMPRESSIONS - Left Ventricle: The left ventricle is unchanged from pre-bypass. - Aorta: The aorta appears unchanged from pre-bypass. - Left Atrial Appendage: The left atrial appendage appears unchanged from pre-bypass. - Aortic Valve: The aortic valve appears unchanged from pre-bypass. - Mitral Valve: The mitral valve appears unchanged from pre-bypass. - Tricuspid Valve: The tricuspid valve appears unchanged from pre-bypass.TV regurgitation improved with chest closure. - Interatrial Septum: The interatrial septum appears unchanged from pre-bypass. PRE-OP FINDINGS  Left Ventricle: The left ventricle has severely reduced systolic function, with an ejection fraction of 25-30%. The cavity size was moderately dilated. There is severe concentric left ventricular hypertrophy. Right Ventricle: The right ventricle has normal systolic function. The cavity was normal. There is increased right ventricular wall thickness. Left Atrium: Left atrial size was dilated. The left atrial appendage is well visualized and there is evidence of thrombus present. Right Atrium: Right atrial size was normal in size. Right atrial pressure is estimated at 10 mmHg. Interatrial Septum: No atrial  level shunt detected by color flow Doppler. Pericardium: A small pericardial effusion is present. Mitral Valve: The mitral valve is normal in structure. Mild thickening of the mitral valve leaflet. Mitral valve regurgitation is mild to moderate by color flow Doppler. The MR jet is posteriorly-directed. There is no evidence of mitral valve vegetation. Tricuspid Valve: The tricuspid valve was normal in structure. Tricuspid valve regurgitation is mild-moderate by color flow Doppler. The jet is directed toward the atrial septum. There is no evidence of tricuspid valve vegetation. Aortic Valve: The aortic valve is tricuspid There is mild thickening of the aortic valve Aortic valve regurgitation is trivial by color flow Doppler. There is no evidence of aortic valve stenosis. There  is no evidence of a vegetation on the aortic valve. Pulmonic Valve: The pulmonic valve was normal in structure, with normal. Pulmonic valve regurgitation is trivial by color flow Doppler. +-------------+--------++ AORTIC VALVE          +-------------+--------++ AV Mean Grad:6.0 mmHg +-------------+--------++  Nolon Nations MD Electronically signed by Nolon Nations MD Signature Date/Time: 03/06/2020/5:34:08 PM    Final      Medications:     Scheduled Medications: . acetaminophen  1,000 mg Oral Q6H   Or  . acetaminophen (TYLENOL) oral liquid 160 mg/5 mL  1,000 mg Per Tube Q6H  . aspirin EC  325 mg Oral Daily   Or  . aspirin  324 mg Per Tube Daily  . atorvastatin  80 mg Oral q1800  . bisacodyl  10 mg Oral Daily   Or  . bisacodyl  10 mg Rectal Daily  . chlorhexidine gluconate (MEDLINE KIT)  15 mL Mouth Rinse BID  . Chlorhexidine Gluconate Cloth  6 each Topical Daily  . docusate sodium  200 mg Oral Daily  . mouth rinse  15 mL Mouth Rinse 10 times per day  . metoprolol tartrate  12.5 mg Oral BID   Or  . metoprolol tartrate  12.5 mg Per Tube BID  . [START ON 03/08/2020] pantoprazole  40 mg Oral Daily  . sodium chloride  flush  10-40 mL Intracatheter Q12H  . sodium chloride flush  3 mL Intravenous Q12H    Infusions: . sodium chloride 20 mL/hr at 03/07/20 0700  . sodium chloride    . sodium chloride 20 mL/hr at 03/06/20 1650  . cefTRIAXone (ROCEPHIN)  IV Stopped (03/05/20 1404)  . cefUROXime (ZINACEF)  IV Stopped (03/07/20 0503)  . dexmedetomidine (PRECEDEX) IV infusion 0.7 mcg/kg/hr (03/07/20 0700)  . epinephrine 4 mcg/min (03/07/20 0700)  . famotidine (PEPCID) IV Stopped (03/06/20 2210)  . insulin 5 mL/hr at 03/07/20 0700  . lactated ringers    . lactated ringers    . lactated ringers 20 mL/hr at 03/07/20 0700  . milrinone 0.375 mcg/kg/min (03/07/20 0730)  . nitroGLYCERIN 7 mcg/min (03/07/20 0700)  . norepinephrine (LEVOPHED) Adult infusion Stopped (03/06/20 2117)  . phenylephrine (NEO-SYNEPHRINE) Adult infusion    . vasopressin (PITRESSIN) infusion - *FOR SHOCK* 0.02 Units/min (03/07/20 0720)    PRN Medications: sodium chloride, dextrose, lactated ringers, metoprolol tartrate, midazolam, morphine injection, ondansetron (ZOFRAN) IV, oxyCODONE, sodium chloride flush, sodium chloride flush, traMADol    Assessment/Plan   1. S/P CABG x4: LIMA-LAD, seq radial-D/OM, RIMA-PDA Remains intubated on pressors.  Possible extubation later today.   2. NSTEMI  HS Trop >10000  LHC with 99% LAD, 80% mid circumflex, 70% ostial circumflex, 99% small ramus. 95% mid LAD, Mid 99% PDA   CT surgery consulted.  3. Acute Systolic Heart Failure , ICM  - EF on cath ~ 30 %. - Echo pre-op EF 30%, normal RV.   -CVP 20. Hold off on diuresis per Dr Orvan Seen  -  4. Acute Respiratory Failure -Placed on Bipap in the cath lab.  - Intubated but possible extubation later today.   5. Melena  - Resolved.  - Continue protonix.  -May need GI  Had colonoscopy 2017 by Dr Benson Norway with Two 2 to 3 mm polyps in the transverse colon and in the ascending colon and diverticulosis in the sigmoid colon and in the descending  colon.  6. DMII -Hgb A1C 10  -Continue sliding scale.    7. UTI -Noted on lab work 02/28/20 prior  to admit.  - WBC 12.9  - Urine culture resent 03/04/20 - Received 3 days rocephin  8. Anemia , expected blood loss Hgb down after surgery.   Length of Stay: 5  Amy Clegg, NP  03/07/2020, 7:31 AM  Advanced Heart Failure Team Pager [phone] (M-F; 7a - 4p)  Please contact [facility] for night-coverage after hours (4p -7a ) and weekends on [email or web address]  Patient seen with NP, agree with the above note.   She is stable this morning post-op on epinephrine 4, milrinone 0.375, vasopressin 0.02, NTG 7.  Post-op volume overload with CVP around 20.  Stable creatinine 1.38.  CI 2 with co-ox 78%.   General: NAD, awake on vent.  Neck: Thick, JVP difficult, no thyromegaly or thyroid nodule.  Lungs: Decreased at bases.  CV: Nondisplaced PMI.  Heart regular S1/S2, no S3/S4, no murmur.  1+ ankle edema.   Abdomen: Soft, nontender, no hepatosplenomegaly, no distention.  Skin: Intact without lesions or rashes.  Neurologic: Alert, follows commands.  Extremities: No clubbing or cyanosis.  HEENT: Normal.   Should be able to extubate later today.  Gradual wean of pressors/inotropes, hopefully off vasopressin after extubation.   Will need eventual diuresis, will discuss timing with Dr. Orvan Seen.  Creatinine stable.   Continue ASA, statin.   CRITICAL CARE Performed by: Loralie Champagne  Total critical care time: 35 minutes  Critical care time was exclusive of separately billable procedures and treating other patients.  Critical care was necessary to treat or prevent imminent or life-threatening deterioration.  Critical care was time spent personally by me on the following activities: development of treatment plan with patient and/or surrogate as well as nursing, discussions with consultants, evaluation of patient's response to treatment, examination of patient, obtaining history from patient or  surrogate, ordering and performing treatments and interventions, ordering and review of laboratory studies, ordering and review of radiographic studies, pulse oximetry and re-evaluation of patient's condition.  Loralie Champagne 03/07/2020 10:22 AM

## 2020-03-07 NOTE — Addendum Note (Signed)
Addendum  created 03/07/20 1302 by Nolon Nations, MD   Child order released for a procedure order, Clinical Note Signed, Image imported, Intraprocedure Blocks edited, LDA created via procedure documentation, LDA updated via procedure documentation

## 2020-03-07 NOTE — Progress Notes (Signed)
      [address]       [place] [address]             [phone]      POD # 1  CABG x 4  Extubated earlier today  BP (!) 97/59   Pulse 92   Temp 97.7 F (36.5 C)   Resp (!) 28   Ht 5\' 2"  (1.575 m)   Wt (!) 141.1 kg   SpO2 99%   BMI 56.90 kg/m    Intake/Output Summary (Last 24 hours) at 03/07/2020 1737 Last data filed at 03/07/2020 1700 Gross per 24 hour  Intake 3246.61 ml  Output 1215 ml  Net 2031.61 ml   Hct= 26 K= 4.2, creatinine 1.21  Will transition off insulin drip- was on 80 U of NPH daily at home- will start with levemir 25 BID and Q4 SSI  Marshae Azam C. Roxan Hockey, MD [facility] [phone]

## 2020-03-07 NOTE — Discharge Instructions (Signed)

## 2020-03-07 NOTE — Addendum Note (Signed)
Addendum  created 03/07/20 1113 by Josephine Igo, CRNA   Order list changed

## 2020-03-07 NOTE — Progress Notes (Deleted)
Nitric weaned per MD Atkins order and turned off at 12:40. Patient was extubated at 13:40. Now on 4 LPM nasal cannula, no complications. RN at bedside.

## 2020-03-07 NOTE — Anesthesia Procedure Notes (Addendum)
Arterial Line Insertion Performed by: Nolon Nations, MD, anesthesiologist  Patient location: Pre-op. Preanesthetic checklist: patient identified, IV checked, site marked, risks and benefits discussed, surgical consent, monitors and equipment checked, pre-op evaluation, timeout performed and anesthesia consent Lidocaine 1% used for infiltration Left, brachial was placed Catheter size: 20 Fr Hand hygiene performed  and maximum sterile barriers used   Attempts: 1 Procedure performed using ultrasound guided technique. Ultrasound Notes:anatomy identified, needle tip was noted to be adjacent to the nerve/plexus identified, no ultrasound evidence of intravascular and/or intraneural injection and image(s) printed for medical record Following insertion, dressing applied, Biopatch and line sutured. Post procedure assessment: normal and unchanged

## 2020-03-07 NOTE — Discharge Summary (Signed)
Physician Discharge Summary  Patient ID: Rhonda Roberts MRN: [id number] DOB/AGE: 04/29/1956 64 y.o.  Admit date: 03/01/2020 Discharge date: 03/15/2020  Admission Diagnoses:  Patient Active Problem List   Diagnosis Date Noted  . Acute respiratory failure ([place]) 03/04/2020  . NSTEMI (non-ST elevated myocardial infarction) ([place])   . Acute systolic HF (heart failure) ([place])   . Coronary artery disease involving native coronary artery of native heart with angina pectoris ([place])   . Type 2 diabetes mellitus with hyperglycemia ([place]) 03/02/2020  . Hypertensive emergency 03/02/2020  . Hyperlipidemia 03/02/2020  . Myocardial injury 03/02/2020  . Gastroenteritis 03/02/2020  . Morbid obesity with BMI of 50.0-59.9, adult ([place]) 03/02/2020  . GERD (gastroesophageal reflux disease) 03/02/2020   Discharge Diagnoses:  Patient Active Problem List   Diagnosis Date Noted  . S/P CABG x 4 03/07/2020  . Acute respiratory failure ([place]) 03/04/2020  . NSTEMI (non-ST elevated myocardial infarction) ([place])   . Acute systolic HF (heart failure) ([place])   . Coronary artery disease involving native coronary artery of native heart with angina pectoris ([place])   . Type 2 diabetes mellitus with hyperglycemia ([place]) 03/02/2020  . Hypertensive emergency 03/02/2020  . Hyperlipidemia 03/02/2020  . Myocardial injury 03/02/2020  . Gastroenteritis 03/02/2020  . Morbid obesity with BMI of 50.0-59.9, adult ([place]) 03/02/2020  . GERD (gastroesophageal reflux disease) 03/02/2020   Discharged Condition: good  History of Present Illness:  Rhonda Roberts is a 64 yo obese AA female with history of DM and HTN.  She developed complaints of abdominal pain and presented for evaluation.  Workup revealed the patient had actually suffered an MI and was transferred to [facility] for further care.  Hospital Course:   She was taken to the catheterization lab which showed multivessel CAD.  Post cath the patient developed flash  pulmonary edema and required admission to the ICU.  She was treated with IV Lasix and stabilized rather quickly.  It was felt coronary bypass grafting would be indicated and TCTS consult would be obtained.  The patient was evaluated by Dr. Orvan Seen who felt the patient would benefit from coronary bypass grafting.  The risks and benefits of the procedure were explained to the patient and she was agreeable to proceed.  She was taken to the operating room and underwent CABG x 4 utilizing LIMA to LAD, RIMA to Distal RCA, and sequential radial artery to OM 2 and Diagonal.  She underwent open radial artery harvest.  The patient tolerated the procedure without difficulty and was taken to the SICU in stable condition.  During his stay in the SICU the patient require epinephrine, Milrinone, NTG, and Vasopressin.  These were weaned as hemodynamics allowed.  She was weaned and extubated on 03/07/2020.  She monitored closely by the advanced heart failure team.  Her chest tubes and arterial lines were removed without difficulty.  She developed post operative blood loss anemia.  She required transfusion of packed cells.  She was treated aggressively with IV diuretics for volume overload.  She was maintaining NSR and transferred to the progressive care unit in stable condition.  She continues to make good progress.  She is participating with PT/OT and they have recommended home health.  She has been stable off Milrinone and her most recent co-ox was 67.  The patient had a mild elevation in her creatinine which was felt to be due to her diuretic regimen.  This recovered without intervention.  Her pacing wires were removed without difficulty.  Her incisions  are healing without evidence of infection.  She is medically stable for discharge home today.     Consults: cardiology  Significant Diagnostic Studies: angiography:    The left ventricular ejection fraction is 25-35% by visual estimate.  LV end diastolic pressure is  severely elevated.  There is severe left ventricular systolic dysfunction.    Widely patent left main  Ostial 99% LAD, followed by 80% mid LAD and 80% moderate size diagonal (bifurcation [place] 111).  75% ostial circumflex, 60% proximal circumflex, 90% mid circumflex proximal to a large third obtuse marginal.  Small ramus intermedius with ostial 99% stenosis  Dominant RCA with distal 70% stenosis and 99% stenosis in the proximal third of the large PDA.  LVEF less than 30%.  Inferior wall motion appears relatively normal.  Anterior wall is akinetic.  LVEDP despite absence of the symptoms with the patient lying flat 38 to 40 mmHg.  Post-cath complication: Flash pulmonary edema while being transported from cath table to stretcher.  Treatments: surgery:    Coronary Artery Bypass Grafting x 4             Left Internal Mammary Artery to Distal Left Anterior Descending Coronary Artery; pedicled RIMA  Graft to Posterior Descending Coronary Artery; left radial artery Graft to distal Obtuse Marginal Branch of Left Circumflex Coronary Artery and Diagonal Branch Coronary Artery as a sequenced graft; left radial artery harvesting; bilateral IMA harvesting Completion graft surveillance with indocyanine green fluorescence imaging (SPY) Rigid sternal reconstruction with linear plating system Multilevel rib block with exparel solution  Discharge Exam: Blood pressure (!) 144/71, pulse 81, temperature 98.8 F (37.1 C), temperature source Oral, resp. rate 14, height 5\' 2"  (1.575 m), weight 133.6 kg, SpO2 99 %.  Gen: no apparent distress Heart: RRR Lungs; CTA bilaterally Ext: mild edema Incisions: Sternotomy clean and dry, staples remain in place, left radial site clean and dry, some small blisters lateral to incision   Discharge medications:  The patient has been discharged on:   1.Beta Blocker:  Yes [  ]                              No   [ X  ]                              If No, reason:  low EF, heart failure  2.Ace Inhibitor/ARB: Yes [ X  ]                                     No  [    ]                                     If No, reason:  3.Statin:   Yes [ X  ]                  No  [   ]                  If No, reason:  4.Ecasa:  Yes  [ X  ]                  No   [   ]  If No, reason:    Discharge Instructions    Amb Referral to Cardiac Rehabilitation   Complete by: As directed    Diagnosis:  CABG NSTEMI     CABG X ___: 4   After initial evaluation and assessments completed: Virtual Based Care may be provided alone or in conjunction with Phase 2 Cardiac Rehab based on patient barriers.: Yes   Ambulatory referral to Nutrition and Diabetic Education   Complete by: As directed    HgbA1C of 10%.     Allergies as of 03/15/2020      Reactions   Sulfa Antibiotics Itching, Swelling, Rash      Medication List    TAKE these medications   amiodarone 200 MG tablet Commonly known as: PACERONE Take 1 tablet (200 mg total) by mouth 2 (two) times daily. For 7 days, then decrease to 200 mg daily   aspirin 81 MG chewable tablet Chew 81 mg by mouth daily.   atorvastatin 80 MG tablet Commonly known as: LIPITOR Take 1 tablet (80 mg total) by mouth daily at 6 PM.   clopidogrel 75 MG tablet Commonly known as: PLAVIX Take 1 tablet (75 mg total) by mouth daily.   digoxin 0.125 MG tablet Commonly known as: LANOXIN Take 1 tablet (0.125 mg total) by mouth daily.   insulin NPH Human 100 UNIT/ML injection Commonly known as: NOVOLIN N Inject 80 Units into the skin daily. May do additional 80 units if evening blood sugar over 150   isosorbide dinitrate 10 MG tablet Commonly known as: ISORDIL Take 1 tablet (10 mg total) by mouth 3 (three) times daily.   multivitamin with minerals Tabs tablet Take 1 tablet by mouth every morning.   oxyCODONE 5 MG immediate release tablet Commonly known as: Oxy IR/ROXICODONE Take 1-2 tablets (5-10 mg total) by mouth  every 4 (four) hours as needed for severe pain.   sacubitril-valsartan 24-26 MG Commonly known as: ENTRESTO Take 1 tablet by mouth 2 (two) times daily.   spironolactone 25 MG tablet Commonly known as: ALDACTONE Take 1 tablet (25 mg total) by mouth daily.   VITAMIN B 12 PO Take 2 tablets by mouth daily.   Vitamin D 50 MCG (2000 UT) Caps Take 2,000 Units by mouth every morning.            Durable Medical Equipment  (From admission, onward)         Start     Ordered   03/14/20 1613  For home use only DME 3 n 1  Once    Comments: HD   03/14/20 1612   03/13/20 0716  For home use only DME Walker rolling  Once    Question Answer Comment  Walker: With 5 Inch Wheels   Patient needs a walker to treat with the following condition S/P CABG x 3      03/13/20 0715         Follow-up Information    Wonda Olds, MD Follow up on 03/25/2020.   Specialty: Cardiothoracic Surgery Why: Appointment is at 11:30 Contact information: [address] [place]  [address] [place] Follow up.   Why: HHPT for [facility] Follow up.   Why: rolling walker and 3 n 1 for charity Contact information: [place] [address] [phone]        Deberah Pelton, NP Follow up on  03/26/2020.   Specialty: Cardiology Why: Appointment is at 9:45 Contact information: [address] [place] [address] [phone]           Signed: Ellwood Handler, PA-C  03/15/2020, 7:23 AM

## 2020-03-07 NOTE — Addendum Note (Signed)
Addendum  created 03/07/20 0943 by Josephine Igo, CRNA   Order list changed

## 2020-03-07 NOTE — Op Note (Signed)
CARDIOTHORACIC SURGERY OPERATIVE NOTE  Date of Procedure: 03/06/20 Preoperative Diagnosis: Severe 3-vessel Coronary Artery Disease, s/p NSTEMI  Postoperative Diagnosis: Same  Procedure:    Coronary Artery Bypass Grafting x 4  Left Internal Mammary Artery to Distal Left Anterior Descending Coronary Artery; pedicled RIMA  Graft to Posterior Descending Coronary Artery; left radial artery Graft to distal Obtuse Marginal Branch of Left Circumflex Coronary Artery and Diagonal Branch Coronary Artery as a sequenced graft; left radial artery harvesting; bilateral IMA harvesting Completion graft surveillance with indocyanine green fluorescence imaging (SPY) Rigid sternal reconstruction with linear plating system Multilevel rib block with exparel solution   Surgeon: B. Murvin Natal, MD  Assistant: Curley Spice PA-C  Anesthesia: get  Operative Findings:  Mildly reduced left ventricular systolic function  good quality internal mammary artery conduits  good quality radial artery conduit  Good quality target vessels for grafting    BRIEF CLINICAL NOTE AND INDICATIONS FOR SURGERY  64 yo lady presented with several day history of abdominal pain. Evaluation showed NSTEMI for which she underwent LHC. This demonstrated severe multivessel CAD. She is referred for CABG.    DETAILS OF THE OPERATIVE PROCEDURE  Preparation:  The patient is brought to the operating room on the above mentioned date and central monitoring was established by the anesthesia team including placement of Swan-Ganz catheter and radial arterial line. The patient is placed in the supine position on the operating table.  Intravenous antibiotics are administered. General endotracheal anesthesia is induced uneventfully. A Foley catheter is placed.  Baseline transesophageal echocardiogram was performed.  Findings were notable for mildly reduced LV function and mild to moderate mitral regurgitation.  The  patient's chest, abdomen, both groins, left upper extremity, and both lower extremities are prepared and draped in a sterile manner. A time out procedure is performed.   Surgical Approach and Conduit Harvest:  Attention is 1st turned to the left radial artery harvest, which is done by an open technique. Once completed, the incisiion is closed in layers and the arm is tucked at the side. Then, a median sternotomy incision was performed and the left internal mammary artery is dissected from the chest wall and prepared for bypass grafting. The left internal mammary artery is notably good quality conduit. When completed, the [place] is harvested in a similar fashion. Following systemic heparinization, the  internal mammary arteries were transected distally noted to have excellent flow.The grafts were treated with a solution of papaverine.   Extracorporeal Cardiopulmonary Bypass and Myocardial Protection:  The pericardium is opened. The ascending aorta is normal in appearance. The ascending aorta and the right atrium are cannulated for cardiopulmonary bypass.  Adequate heparinization is verified.     Cardiopulmonary bypass was begun and the surface of the heart is inspected. Distal target vessels are selected for coronary artery bypass grafting. A cardioplegia cannula is placed in the ascending aorta.    The patient is allowed to cool passively to [facility] systemic temperature.  The aortic cross clamp is applied and cold blood cardioplegia is delivered initially in an antegrade fashion through the aortic root.   The initial cardioplegic arrest is rapid with early diastolic arrest.  Repeat doses of cardioplegia are administered intermittently throughout the entire cross clamp portion of the operation through the aortic root and through subsequently placed vein grafts in order to maintain completely flat electrocardiogram.   Coronary Artery Bypass Grafting:   The distal obtuse marginal branch of the left  circumflex coronary artery was grafted using  the left radial artery graft in an end-to-side fashion.  At the site of distal anastomosis the target vessel was good quality and measured approximately 1.5 mm in diameter. Next, the 1st diagonal branch of the left anterior descending coronary artery was grafted using a reversed saphenous vein graft in an end-to-side fashion.  At the site of distal anastomosis the target vessel was good quality and measured approximately 1.5 mm in diameter. Anastomotic patency and runoff was confirmed with indocyanine green fluorescence imaging (SPY).    The distal left anterior coronary artery was grafted with the left internal mammary artery in an end-to-side fashion.  At the site of distal anastomosis the target vessel was good quality and measured approximately 1.5 mm in diameter. Anastomotic patency and runoff was confirmed with indocyanine green fluorescence imaging (SPY).  The  posterior descending branch of the right coronary artery was grafted using the pedicled RIMA graft in an end-to-side fashion.  At the site of distal anastomosis the target vessel was good quality and measured approximately 1.5 mm in diameter. Anastomotic patency and runoff was confirmed with indocyanine green fluorescence imaging (SPY).  The proximal radial graft anastomosis was placed directly to the ascending aorta prior to removal of the aortic cross clamp. Deairing procedures were performed and the aortic cross clamp was removed.    Procedure Completion:  All proximal and distal coronary anastomoses were inspected for hemostasis and appropriate graft orientation. Epicardial pacing wires are fixed to the right ventricular outflow tract and to the right atrial appendage. The patient is rewarmed to 37C temperature. The patient is weaned and disconnected from cardiopulmonary bypass.  The patient's rhythm at separation from bypass was sinus.  The patient was weaned from cardiopulmonary bypass  with moderate inotropic support.   Followup transesophageal echocardiogram performed after separation from bypass revealed improved LV function  The aortic and venous cannula were removed uneventfully. Protamine was administered to reverse the anticoagulation. The mediastinum and pleural space were inspected for hemostasis and irrigated with saline solution. The mediastinum and bilateral pleural spaces were drained using fluted chest tubes placed through separate stab incisions inferiorly.  The soft tissues anterior to the aorta were reapproximated loosely. The sternum is closed with double strength sternal wire. The soft tissues anterior to the sternum were closed in multiple layers and the skin is closed with a running subcuticular skin closure.  The post-bypass portion of the operation was notable for stable rhythm and hemodynamics.     Disposition:  The patient tolerated the procedure well and is transported to the surgical intensive care in stable condition. There are no intraoperative complications. All sponge instrument and needle counts are verified correct at completion of the operation.    Jayme Cloud, MD 03/07/2020 5:59 PM

## 2020-03-07 NOTE — Progress Notes (Signed)
Inpatient Diabetes Program Recommendations  AACE/ADA: New Consensus Statement on Inpatient Glycemic Control (2015)  Target Ranges:  Prepandial:   less than 140 mg/dL      Peak postprandial:   less than 180 mg/dL (1-2 hours)      Critically ill patients:  140 - 180 mg/dL   Lab Results  Component Value Date   GLUCAP 141 (H) 03/07/2020   HGBA1C 10.0 (H) 03/02/2020    Review of Glycemic Control  Diabetes history: DM2 Outpatient Diabetes medications: Novolin N 80 units QD. May do another 80 units in evening if blood sugars > 150 mg/dL. Current orders for Inpatient glycemic control: IV insulin  Hgba1C - 10.0% - DM diagnosed in 2012  Inpatient Diabetes Program Recommendations:     For transitioning off insulin drip: Give Levemir or NPH 1-2H prior to discontinuation of drip.  NPH 40 units bid (AM and HS) Novolog 0-20 units tidwc and hs Novolog 8 units tidwc for meal coverage insulin if eating > 50% meal Good candidate for SGLT2 at discharge.  Will need f/u with PCP for diabetes management and tighter glycemic control.  Thank you. Lorenda Peck, RD, LDN, CDE Inpatient Diabetes Coordinator [phone]

## 2020-03-08 ENCOUNTER — Inpatient Hospital Stay (HOSPITAL_COMMUNITY): Payer: Self-pay

## 2020-03-08 LAB — CBC
HCT: 25 % — ABNORMAL LOW (ref 36.0–46.0)
HCT: 25.8 % — ABNORMAL LOW (ref 36.0–46.0)
Hemoglobin: 8.1 g/dL — ABNORMAL LOW (ref 12.0–15.0)
Hemoglobin: 8.3 g/dL — ABNORMAL LOW (ref 12.0–15.0)
MCH: 28.8 pg (ref 26.0–34.0)
MCH: 28.7 pg (ref 26.0–34.0)
MCHC: 32.4 g/dL (ref 30.0–36.0)
MCHC: 32.2 g/dL (ref 30.0–36.0)
MCV: 89 fL (ref 80.0–100.0)
MCV: 89.3 fL (ref 80.0–100.0)
Platelets: 145 10*3/uL — ABNORMAL LOW (ref 150–400)
Platelets: 157 10*3/uL (ref 150–400)
RBC: 2.81 MIL/uL — ABNORMAL LOW (ref 3.87–5.11)
RBC: 2.89 MIL/uL — ABNORMAL LOW (ref 3.87–5.11)
RDW: 15.2 % (ref 11.5–15.5)
RDW: 15.4 % (ref 11.5–15.5)
WBC: 15.2 10*3/uL — ABNORMAL HIGH (ref 4.0–10.5)
WBC: 14.1 10*3/uL — ABNORMAL HIGH (ref 4.0–10.5)
nRBC: 0.1 % (ref 0.0–0.2)
nRBC: 0.2 % (ref 0.0–0.2)

## 2020-03-08 LAB — BASIC METABOLIC PANEL
Anion gap: 10 (ref 5–15)
Anion gap: 11 (ref 5–15)
BUN: 28 mg/dL — ABNORMAL HIGH (ref 8–23)
BUN: 27 mg/dL — ABNORMAL HIGH (ref 8–23)
CO2: 20 mmol/L — ABNORMAL LOW (ref 22–32)
CO2: 21 mmol/L — ABNORMAL LOW (ref 22–32)
Calcium: 8.1 mg/dL — ABNORMAL LOW (ref 8.9–10.3)
Calcium: 8.3 mg/dL — ABNORMAL LOW (ref 8.9–10.3)
Chloride: 105 mmol/L (ref 98–111)
Chloride: 104 mmol/L (ref 98–111)
Creatinine, Ser: 1.24 mg/dL — ABNORMAL HIGH (ref 0.44–1.00)
Creatinine, Ser: 1.19 mg/dL — ABNORMAL HIGH (ref 0.44–1.00)
GFR calc Af Amer: 53 mL/min — ABNORMAL LOW (ref >60)
GFR calc Af Amer: 56 mL/min — ABNORMAL LOW (ref >60)
GFR calc non Af Amer: 46 mL/min — ABNORMAL LOW (ref >60)
GFR calc non Af Amer: 48 mL/min — ABNORMAL LOW (ref >60)
Glucose, Bld: 242 mg/dL — ABNORMAL HIGH (ref 70–99)
Glucose, Bld: 256 mg/dL — ABNORMAL HIGH (ref 70–99)
Potassium: 4.6 mmol/L (ref 3.5–5.1)
Potassium: 4.5 mmol/L (ref 3.5–5.1)
Sodium: 135 mmol/L (ref 135–145)
Sodium: 136 mmol/L (ref 135–145)

## 2020-03-08 LAB — BLOOD GAS, ARTERIAL
Acid-base deficit: 1.6 mmol/L (ref 0.0–2.0)
Bicarbonate: 22.1 mmol/L (ref 20.0–28.0)
FIO2: 21
O2 Saturation: 93.4 %
Patient temperature: 37
pCO2 arterial: 33.9 mmHg (ref 32.0–48.0)
pH, Arterial: 7.429 (ref 7.350–7.450)
pO2, Arterial: 70.4 mmHg — ABNORMAL LOW (ref 83.0–108.0)

## 2020-03-08 LAB — COOXEMETRY PANEL
Carboxyhemoglobin: 1.3 % (ref 0.5–1.5)
Carboxyhemoglobin: 1.1 % (ref 0.5–1.5)
Carboxyhemoglobin: 0.7 % (ref 0.5–1.5)
Methemoglobin: 1.2 % (ref 0.0–1.5)
Methemoglobin: 0.9 % (ref 0.0–1.5)
Methemoglobin: 0.3 % (ref 0.0–1.5)
O2 Saturation: 62.1 %
O2 Saturation: 55.6 %
O2 Saturation: 42.5 %
Total hemoglobin: 9.1 g/dL — ABNORMAL LOW (ref 12.0–16.0)
Total hemoglobin: 11.9 g/dL — ABNORMAL LOW (ref 12.0–16.0)
Total hemoglobin: 9.6 g/dL — ABNORMAL LOW (ref 12.0–16.0)

## 2020-03-08 LAB — GLUCOSE, CAPILLARY
Glucose-Capillary: 163 mg/dL — ABNORMAL HIGH (ref 70–99)
Glucose-Capillary: 190 mg/dL — ABNORMAL HIGH (ref 70–99)
Glucose-Capillary: 214 mg/dL — ABNORMAL HIGH (ref 70–99)
Glucose-Capillary: 217 mg/dL — ABNORMAL HIGH (ref 70–99)
Glucose-Capillary: 221 mg/dL — ABNORMAL HIGH (ref 70–99)
Glucose-Capillary: 265 mg/dL — ABNORMAL HIGH (ref 70–99)
Glucose-Capillary: 281 mg/dL — ABNORMAL HIGH (ref 70–99)

## 2020-03-08 MED ORDER — MILRINONE LACTATE IN DEXTROSE 20-5 MG/100ML-% IV SOLN: 0.1250 ug/kg/min | INTRAVENOUS | Status: DC

## 2020-03-08 MED ORDER — AMIODARONE HCL 200 MG PO TABS
400.0000 mg | ORAL_TABLET | Freq: Two times a day (BID) | ORAL | Status: DC
  Administered 2020-03-08 – 2020-03-12 (×9): 400 mg via ORAL
  Filled 2020-03-08 (×9): qty 2

## 2020-03-08 MED ORDER — FUROSEMIDE 10 MG/ML IJ SOLN
40.0000 mg | Freq: Once | INTRAMUSCULAR | Status: AC
  Administered 2020-03-08: 40 mg via INTRAVENOUS
  Filled 2020-03-08: qty 4

## 2020-03-08 MED ORDER — LIVING WELL WITH DIABETES BOOK
Freq: Once | Status: AC
  Filled 2020-03-08: qty 1

## 2020-03-08 MED ORDER — FUROSEMIDE 10 MG/ML IJ SOLN
40.0000 mg | Freq: Once | INTRAMUSCULAR | Status: AC
  Administered 2020-03-08: 16:00:00 40 mg via INTRAVENOUS
  Filled 2020-03-08: qty 4

## 2020-03-08 MED ORDER — METOPROLOL TARTRATE 5 MG/5ML IV SOLN
5.0000 mg | INTRAVENOUS | Status: DC | PRN
  Administered 2020-03-08: 10:00:00 5 mg via INTRAVENOUS
  Filled 2020-03-08: qty 5

## 2020-03-08 MED ORDER — INSULIN DETEMIR 100 UNIT/ML ~~LOC~~ SOLN
32.0000 [IU] | Freq: Two times a day (BID) | SUBCUTANEOUS | Status: DC
  Administered 2020-03-08 – 2020-03-15 (×14): 32 [IU] via SUBCUTANEOUS
  Filled 2020-03-08 (×15): qty 0.32

## 2020-03-08 MED ORDER — ISOSORBIDE DINITRATE 10 MG PO TABS
10.0000 mg | ORAL_TABLET | Freq: Three times a day (TID) | ORAL | Status: DC
  Administered 2020-03-08 – 2020-03-15 (×22): 10 mg via ORAL
  Filled 2020-03-08 (×23): qty 1

## 2020-03-08 MED ORDER — METOPROLOL TARTRATE 12.5 MG HALF TABLET
12.5000 mg | ORAL_TABLET | Freq: Two times a day (BID) | ORAL | Status: DC
  Administered 2020-03-08 – 2020-03-10 (×4): 12.5 mg via ORAL
  Filled 2020-03-08 (×4): qty 1

## 2020-03-08 MED ORDER — COLCHICINE 0.3 MG HALF TABLET
0.3000 mg | ORAL_TABLET | Freq: Two times a day (BID) | ORAL | Status: DC
  Administered 2020-03-08 – 2020-03-10 (×6): 0.3 mg via ORAL
  Filled 2020-03-08 (×7): qty 1

## 2020-03-08 MED ORDER — MILRINONE LACTATE IN DEXTROSE 20-5 MG/100ML-% IV SOLN
0.1250 ug/kg/min | INTRAVENOUS | Status: DC
  Administered 2020-03-08: 0.125 ug/kg/min via INTRAVENOUS
  Filled 2020-03-08: qty 100

## 2020-03-08 NOTE — Progress Notes (Addendum)
Advanced Heart Failure Rounding Note  PCP-Cardiologist: Skeet Latch, MD   Subjective:    S/P CABG x4  4/14  Extubated 4/15.   Co-ox 62% this AM. Seen by CT surgery. Weaning off Epi today, down to 1 mcg currently. Milrinone has been turned down to 0.1.25 mcg today.   Swan # CVP 14 PA 51/28 (36) CO 7.03 CI 3.10    Resting ok in bed. No CP or dyspnea. Daughter present at bedside.   Objective:   Weight Range: (!) 142.8 kg Body mass index is 57.6 kg/m.   Vital Signs:   Temp:  [97 F (36.1 C)-99.9 F (37.7 C)] 99.3 F (37.4 C) (04/16 0900) Pulse Rate:  [89-108] 104 (04/16 0900) Resp:  [14-30] 28 (04/16 0900) BP: (84-97)/(46-59) 85/47 (04/15 1400) SpO2:  [87 %-100 %] 97 % (04/16 0900) Arterial Line BP: (93-178)/(56-84) 155/72 (04/16 0900) FiO2 (%):  [40 %] 40 % (04/15 1118) Weight:  [142.8 kg] 142.8 kg (04/16 0500) Last BM Date: 03/05/20  Weight change: Filed Weights   03/06/20 0500 03/07/20 0400 03/08/20 0500  Weight: 136 kg (!) 141.1 kg (!) 142.8 kg    Intake/Output:   Intake/Output Summary (Last 24 hours) at 03/08/2020 0928 Last data filed at 03/08/2020 0900 Gross per 24 hour  Intake 2621.52 ml  Output 1450 ml  Net 1171.52 ml      Physical Exam  CVP 14 General:  Obese AAF, slightly diaphoretic, no respiratory distress HEENT: normal  Neck:  Thick neck, JVD assessment difficult.  + Swan in Rt IJ Carotids 2+ bilat; no bruits. No lymphadenopathy or thryomegaly appreciated. Cor: PMI nondisplaced. Regular rate & rhythm. No rubs, gallops or murmurs. Sternotomy site ok, + CTs Lungs: clear, no wheezing  Abdomen: obese soft, nontender, nondistended. No hepatosplenomegaly. No bruits or masses. Good bowel sounds. Extremities: no cyanosis, clubbing, rash, obese extremities 1+ bilateral LEE, Rt forearm w/ wound vac in place (s/p radial artery graft harvest)  Neuro: A&OX3. Moving all extremities    Telemetry   NSR 83 bpm   EKG    N/a   Labs     CBC Recent Labs    03/07/20 1608 03/08/20 0433  WBC 14.9* 15.2*  HGB 8.4* 8.1*  HCT 25.8* 25.0*  MCV 88.7 89.0  PLT 146* [id number]*   Basic Metabolic Panel Recent Labs    03/07/20 0422 03/07/20 1242 03/07/20 1608 03/08/20 0433  NA 138   < > 138 135  K 4.3   < > 4.2 4.6  CL 109  --  108 105  CO2 19*  --  21* 20*  GLUCOSE 184*  --  163* 242*  BUN 25*  --  28* 28*  CREATININE 1.38*  --  1.21* 1.24*  CALCIUM 8.4*  --  8.2* 8.1*  MG 2.7*  --  2.9*  --    < > = values in this interval not displayed.   Liver Function Tests No results for input(s): AST, ALT, ALKPHOS, BILITOT, PROT, ALBUMIN in the last 72 hours. No results for input(s): LIPASE, AMYLASE in the last 72 hours. Cardiac Enzymes No results for input(s): CKTOTAL, CKMB, CKMBINDEX, TROPONINI in the last 72 hours.  BNP: BNP (last 3 results) No results for input(s): BNP in the last 8760 hours.  ProBNP (last 3 results) No results for input(s): PROBNP in the last 8760 hours.   D-Dimer No results for input(s): DDIMER in the last 72 hours. Hemoglobin A1C No results for input(s): HGBA1C in the last  72 hours. Fasting Lipid Panel No results for input(s): CHOL, HDL, LDLCALC, TRIG, CHOLHDL, LDLDIRECT in the last 72 hours. Thyroid Function Tests No results for input(s): TSH, T4TOTAL, T3FREE, THYROIDAB in the last 72 hours.  Invalid input(s): FREET3  Other results:   Imaging    DG Chest Port 1 View  Result Date: 03/08/2020 CLINICAL DATA:  Status post coronary artery bypass grafting. Extubation. EXAM: PORTABLE CHEST 1 VIEW COMPARISON:  March 07, 2020 FINDINGS: Endotracheal tube and nasogastric tube have been removed. Swan-Ganz catheter tip is in the right main pulmonary artery, stable. There is a mediastinal drain present, unchanged. There is a chest tube on each side. No pneumothorax. There is mild bibasilar atelectasis. There is no edema or airspace opacity. Heart is mildly enlarged, stable. Patient is status post  coronary artery bypass grafting. Pulmonary vascularity is normal. No adenopathy. No bone lesions. IMPRESSION: Tube and catheter positions as described without pneumothorax. Bibasilar atelectasis. Lungs elsewhere clear. Stable cardiac silhouette. Electronically Signed   By: Lowella Grip III M.D.   On: 03/08/2020 07:59     Medications:     Scheduled Medications: . acetaminophen  1,000 mg Oral Q6H   Or  . acetaminophen (TYLENOL) oral liquid 160 mg/5 mL  1,000 mg Per Tube Q6H  . amiodarone  400 mg Oral BID  . aspirin EC  81 mg Oral Daily  . atorvastatin  80 mg Oral q1800  . bisacodyl  10 mg Oral Daily   Or  . bisacodyl  10 mg Rectal Daily  . Chlorhexidine Gluconate Cloth  6 each Topical Daily  . clopidogrel  75 mg Oral Daily  . colchicine  0.3 mg Oral BID  . docusate sodium  200 mg Oral Daily  . insulin aspart  0-20 Units Subcutaneous Q4H  . insulin detemir  25 Units Subcutaneous BID  . isosorbide dinitrate  10 mg Oral TID  . metoprolol tartrate  12.5 mg Oral BID  . pantoprazole  40 mg Oral Daily  . sodium chloride flush  10-40 mL Intracatheter Q12H  . sodium chloride flush  3 mL Intravenous Q12H    Infusions: . sodium chloride 20 mL/hr at 03/08/20 0900  . sodium chloride    . sodium chloride 20 mL/hr at 03/06/20 1650  . cefTRIAXone (ROCEPHIN)  IV Stopped (03/07/20 1357)  . lactated ringers    . lactated ringers    . lactated ringers 20 mL/hr at 03/08/20 0900  . milrinone 0.125 mcg/kg/min (03/08/20 0900)    PRN Medications: sodium chloride, dextrose, lactated ringers, metoprolol tartrate, morphine injection, ondansetron (ZOFRAN) IV, oxyCODONE, sodium chloride flush, sodium chloride flush, traMADol    Assessment/Plan   1. S/P CABG x4: LIMA-LAD, seq radial-D/OM, RIMA-PDA - extubated 4/15 - stable w/o CP - hemodynamics ok. Co-ox 62%, CI 3.10 by thermo. Weaning off Epi today.  - Wean down milrinone. Reduce dose down to 0.125 mcg. Follow co-ox  2. NSTEMI  HS Trop  >10000  LHC with 99% LAD, 80% mid circumflex, 70% ostial circumflex, 99% small ramus. 95% mid LAD, Mid 99% PDA   S/p CABG x 4, 4/14 (see above)  3. Acute Systolic Heart Failure , ICM  - EF on cath ~ 30 %. - Echo pre-op EF 30%, normal RV.  - Remains 27 lb above pre-op wt  -CVP 14. Diurese w/ IV Lasix, 40 BID - once off pressors, will try to add GDMT as renal function/ BP allows   4. Acute Respiratory Failure - Extubated 4/15 - O2 sats  stable on [place]   5. Melena  - Resolved. Hgb stable at 8.1  - Continue protonix.  - Had colonoscopy 2017 by Dr Benson Norway with Two 2 to 3 mm polyps in the transverse colon and in the ascending colon and diverticulosis in the sigmoid colon and in the descending colon.  6. DMII -Hgb A1C 10  -Continue sliding scale.   - Diabetes coordinator following - Primary team has consulted endocrinology   7. UTI -Noted on lab work 02/28/20 prior to admit.  - WBC 15.  - Urine culture resent 03/04/20. + for [facility]  - Received 3 days rocephin  8. Anemia , expected blood loss - Hgb down after surgery.  - stable at 8.1. Monitor   Length of Stay: [address] PA-C  03/08/2020, 9:28 AM  Advanced Heart Failure Team Pager [phone] (M-F; 7a - 4p)  Please contact [facility] for night-coverage after hours (4p -7a ) and weekends on [email or web address]  Patient seen with PA, agree with the above note.   Now off epinephrine and on milrinone 0.125.  CI ranging 2.1-2.4 on Swan.  CVP elevated, weight up.   General: NAD Neck: Swan in, JVP 14+ cm, no thyromegaly or thyroid nodule.  Lungs: Decreased at bases. CV: Nondisplaced PMI.  Heart regular S1/S2, no S3/S4, no murmur.  1+ edema to knees.   Abdomen: Soft, nontender, no hepatosplenomegaly, no distention.  Skin: Intact without lesions or rashes.  Neurologic: Alert and oriented x 3.  Psych: Normal affect. Extremities: No clubbing or cyanosis.  HEENT: Normal.   Doing well post-op.  Would continue milrinone 0.125  overnight, repeat co-ox. Keep Swan in until tomorrow, get PICC when removed.   Volume overloaded on exam and by Luiz Blare. Lasix 40 mg IV bid, got 1 dose already and looks like good UOP so far.   ASA, Plavix, statin for CAD.   CRITICAL CARE Performed by: Loralie Champagne  Total critical care time: 35 minutes  Critical care time was exclusive of separately billable procedures and treating other patients.  Critical care was necessary to treat or prevent imminent or life-threatening deterioration.  Critical care was time spent personally by me on the following activities: development of treatment plan with patient and/or surrogate as well as nursing, discussions with consultants, evaluation of patient's response to treatment, examination of patient, obtaining history from patient or surrogate, ordering and performing treatments and interventions, ordering and review of laboratory studies, ordering and review of radiographic studies, pulse oximetry and re-evaluation of patient's condition.  Loralie Champagne 03/08/2020 2:29 PM

## 2020-03-08 NOTE — Progress Notes (Signed)
2 Days Post-Op Procedure(s) (LRB): CORONARY ARTERY BYPASS GRAFTING (CABG) using LIMA to LAD, RIMA to PLB, Left Radial Artery Graft: RAG to Diag1; RAG to OM1. (N/A) RADIAL ARTERY HARVEST (Left) TRANSESOPHAGEAL ECHOCARDIOGRAM (TEE) (N/A) INDOCYANINE GREEN FLUORESCENCE IMAGING (ICG) (N/A) Subjective: No complaints  Objective: Vital signs in last 24 hours: Temp:  [97 F (36.1 C)-99.9 F (37.7 C)] 99.3 F (37.4 C) (04/16 0700) Pulse Rate:  [73-108] 100 (04/16 0700) Cardiac Rhythm: Atrial paced (04/16 0400) Resp:  [14-30] 16 (04/16 0700) BP: (96-101)/(49-59) 97/59 (04/15 1000) SpO2:  [87 %-100 %] 96 % (04/16 0700) Arterial Line BP: (100-178)/(56-84) 134/67 (04/16 0700) FiO2 (%):  [40 %] 40 % (04/15 1118) Weight:  [142.8 kg] 142.8 kg (04/16 0500)  Hemodynamic parameters for last 24 hours: PAP: (30-66)/(15-34) 45/28 CVP:  [13 mmHg-28 mmHg] 24 mmHg CO:  [4.5 L/min-7.4 L/min] 7.4 L/min CI:  [2 L/min/m2-3.2 L/min/m2] 3.2 L/min/m2  Intake/Output from previous day: 04/15 0701 - 04/16 0700 In: 1935.1 [I.V.:1588.7; IV Piggyback:346.4] Out: 1090 [Urine:830; Chest Tube:260] Intake/Output this shift: No intake/output data recorded.  General appearance: alert and cooperative Neurologic: intact Heart: regular rate and rhythm Lungs: clear to auscultation bilaterally Extremities: edema 2+ Wound: dressed, dry  Lab Results: Recent Labs    03/07/20 1608 03/08/20 0433  WBC 14.9* 15.2*  HGB 8.4* 8.1*  HCT 25.8* 25.0*  PLT 146* 145*   BMET:  Recent Labs    03/07/20 1608 03/08/20 0433  NA 138 135  K 4.2 4.6  CL 108 105  CO2 21* 20*  GLUCOSE 163* 242*  BUN 28* 28*  CREATININE 1.21* 1.24*  CALCIUM 8.2* 8.1*    PT/INR:  Recent Labs    03/06/20 1709  LABPROT 16.1*  INR 1.3*   ABG    Component Value Date/Time   PHART 7.410 03/07/2020 1242   HCO3 20.7 03/07/2020 1242   TCO2 22 03/07/2020 1242   ACIDBASEDEF 4.0 (H) 03/07/2020 1242   O2SAT 62.1 03/08/2020 0433   CBG  (last 3)  Recent Labs    03/07/20 2049 03/08/20 0020 03/08/20 0501  GLUCAP 255* 281* 214*    Assessment/Plan: S/P Procedure(s) (LRB): CORONARY ARTERY BYPASS GRAFTING (CABG) using LIMA to LAD, RIMA to PLB, Left Radial Artery Graft: RAG to Diag1; RAG to OM1. 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

## 2020-03-08 NOTE — Progress Notes (Signed)
Inpatient Diabetes Program Recommendations  AACE/ADA: New Consensus Statement on Inpatient Glycemic Control (2015)  Target Ranges:  Prepandial:   less than 140 mg/dL      Peak postprandial:   less than 180 mg/dL (1-2 hours)      Critically ill patients:  140 - 180 mg/dL   Lab Results  Component Value Date   GLUCAP 163 (H) 03/08/2020   HGBA1C 10.0 (H) 03/02/2020    Review of Glycemic Control  Spoke with pt and daughter on phone regarding her diabetes control and HgbA1C of 10%. Pt states she had gotten off track with her diet and exercise, and very willing and motivated to start taking care of her diabetes. Pt states she was previously on Janumet, but needs prescription at discharge. Discussion about possibility of going home on a rapid acting insulin to cover her meals. She is only on Novolin N 80 units QAM and checks blood sugars at bedtime. If HS blood sugar is over 150 mg/dL, pt takes another N 80 units QHS. Denies any hypoglycemia. Pt states she would rather f/u with her PCP before starting another kind of insulin. We discussed food, portion control, weight loss and exercise and how this will help improve her blood sugars. States she does not need any prescriptions except wants to get back on Janumet and would like prescription for same. Ordered OP Diabetes Education consult and Living Well with Diabetes book. Pt and daughter appreciative of call.   Inpatient Diabetes Program Recommendations:     Pt requests to go home on same Novolin N prescription. Requesting Janumet prescription.  To start checking blood sugars 3-4x/day and will call MD to schedule f/u appt. Instructed to take blood sugar log to MD visit.  Discussed goal of reducing HgbA1C to < 8% and pt willing to make lifestyle changes.  Thank you. Lorenda Peck, RD, LDN, CDE Inpatient Diabetes Coordinator [phone]

## 2020-03-08 NOTE — Progress Notes (Signed)
Epi weaned to off while maintaining CI>2. Co-ox sent and results discussed with Dr Orvan Seen. Ok to Estate manager/land agent and leave epi off. Will maintain on Milrinone for now.

## 2020-03-08 NOTE — Progress Notes (Addendum)
Inpatient Diabetes Program Recommendations  AACE/ADA: New Consensus Statement on Inpatient Glycemic Control (2015)  Target Ranges:  Prepandial:   less than 140 mg/dL      Peak postprandial:   less than 180 mg/dL (1-2 hours)      Critically ill patients:  140 - 180 mg/dL   Lab Results  Component Value Date   GLUCAP 163 (H) 03/08/2020   HGBA1C 10.0 (H) 03/02/2020    Review of Glycemic Control  Diabetes history: DM2 Outpatient Diabetes medications: Novolin N 80 units QD. May do another 80 units in evening if blood sugars > 150 mg/dL. Current orders for Inpatient glycemic control: Levemir 25 units bid, Novolog 0-20 units Q4H  Hgba1C - 10.0% - DM diagnosed in 2012  Inpatient Diabetes Program Recommendations:    NPH 40 units bid (AM and HS) Novolog 0-20 units tidwc and hs Novolog 8 units tidwc for meal coverage insulin if eating > 50% meal Good candidate for SGLT2 at discharge - Jardiance 10 mg QD  Will need f/u with PCP for diabetes management and tighter glycemic control.  Thank you. Lorenda Peck, RD, LDN, CDE Inpatient Diabetes Coordinator [phone]

## 2020-03-08 NOTE — Progress Notes (Signed)
CT surgery p.m. Rounds  Patient sitting up in chair feels fairly comfortable Inotropes have been weaned today Diuresing well O2 sat satisfactory P.m. labs reviewed-ABG satisfactory potassium 4.5, hemoglobin 8.3 Blood glucose 250-we will increase Levemir and continue every 4 sliding scale

## 2020-03-09 ENCOUNTER — Inpatient Hospital Stay (HOSPITAL_COMMUNITY): Payer: Self-pay

## 2020-03-09 LAB — BASIC METABOLIC PANEL
Anion gap: 8 (ref 5–15)
BUN: 31 mg/dL — ABNORMAL HIGH (ref 8–23)
CO2: 25 mmol/L (ref 22–32)
Calcium: 8.1 mg/dL — ABNORMAL LOW (ref 8.9–10.3)
Chloride: 104 mmol/L (ref 98–111)
Creatinine, Ser: 1.23 mg/dL — ABNORMAL HIGH (ref 0.44–1.00)
GFR calc Af Amer: 54 mL/min — ABNORMAL LOW (ref >60)
GFR calc non Af Amer: 46 mL/min — ABNORMAL LOW (ref >60)
Glucose, Bld: 119 mg/dL — ABNORMAL HIGH (ref 70–99)
Potassium: 4.2 mmol/L (ref 3.5–5.1)
Sodium: 137 mmol/L (ref 135–145)

## 2020-03-09 LAB — GLUCOSE, CAPILLARY
Glucose-Capillary: 103 mg/dL — ABNORMAL HIGH (ref 70–99)
Glucose-Capillary: 125 mg/dL — ABNORMAL HIGH (ref 70–99)
Glucose-Capillary: 140 mg/dL — ABNORMAL HIGH (ref 70–99)
Glucose-Capillary: 141 mg/dL — ABNORMAL HIGH (ref 70–99)
Glucose-Capillary: 162 mg/dL — ABNORMAL HIGH (ref 70–99)
Glucose-Capillary: 192 mg/dL — ABNORMAL HIGH (ref 70–99)

## 2020-03-09 LAB — COOXEMETRY PANEL
Carboxyhemoglobin: 1.3 % (ref 0.5–1.5)
Methemoglobin: 1.1 % (ref 0.0–1.5)
O2 Saturation: 56.6 %
Total hemoglobin: 8.1 g/dL — ABNORMAL LOW (ref 12.0–16.0)

## 2020-03-09 LAB — CBC
HCT: 24.7 % — ABNORMAL LOW (ref 36.0–46.0)
Hemoglobin: 7.9 g/dL — ABNORMAL LOW (ref 12.0–15.0)
MCH: 29 pg (ref 26.0–34.0)
MCHC: 32 g/dL (ref 30.0–36.0)
MCV: 90.8 fL (ref 80.0–100.0)
Platelets: 155 10*3/uL (ref 150–400)
RBC: 2.72 MIL/uL — ABNORMAL LOW (ref 3.87–5.11)
RDW: 15.6 % — ABNORMAL HIGH (ref 11.5–15.5)
WBC: 14.9 10*3/uL — ABNORMAL HIGH (ref 4.0–10.5)
nRBC: 0.4 % — ABNORMAL HIGH (ref 0.0–0.2)

## 2020-03-09 MED ORDER — FUROSEMIDE 10 MG/ML IJ SOLN
60.0000 mg | Freq: Two times a day (BID) | INTRAMUSCULAR | Status: DC
  Administered 2020-03-09 – 2020-03-10 (×3): 60 mg via INTRAVENOUS
  Filled 2020-03-09 (×3): qty 6

## 2020-03-09 MED ORDER — FUROSEMIDE 10 MG/ML IJ SOLN: 40.0000 mg | Freq: Two times a day (BID) | INTRAMUSCULAR | Status: DC

## 2020-03-09 MED ORDER — SPIRONOLACTONE 12.5 MG HALF TABLET
12.5000 mg | ORAL_TABLET | Freq: Every day | ORAL | Status: DC
  Administered 2020-03-09 – 2020-03-10 (×2): 12.5 mg via ORAL
  Filled 2020-03-09 (×2): qty 1

## 2020-03-09 MED ORDER — SORBITOL 70 % SOLN
30.0000 mL | Freq: Once | Status: AC
  Administered 2020-03-09: 30 mL via ORAL
  Filled 2020-03-09: qty 30

## 2020-03-09 NOTE — Progress Notes (Signed)
CT surgery p.m. Rounds  Patient examined and record reviewed.Hemodynamics stable,labs satisfactory.Patient had stable day.Continue current care. Rhonda Roberts 03/09/2020

## 2020-03-09 NOTE — Progress Notes (Signed)
Patient ID: Rhonda Roberts, female   DOB: May 16, 1956, 64 y.o.   MRN: [id number]     Advanced Heart Failure Rounding Note  PCP-Cardiologist: Skeet Latch, MD   Subjective:    - S/P CABG x4  4/14 - Extubated 4/15.   Co-ox 57% on milrinone 0.125.  She diuresed reasonably well yesterday, CVP remains 15 today and weight still up a lot compared to pre-op. Creatinine fairly stable at 1.2.   Sore but otherwise no complaints.   Objective:   Weight Range: (!) 142.6 kg Body mass index is 57.5 kg/m.   Vital Signs:   Temp:  [98.1 F (36.7 C)-99.3 F (37.4 C)] 98.1 F (36.7 C) (04/17 0813) Pulse Rate:  [80-106] 91 (04/17 0800) Resp:  [10-29] 20 (04/17 0800) SpO2:  [89 %-100 %] 97 % (04/17 0800) Arterial Line BP: (102-156)/(56-92) 113/56 (04/17 0800) Weight:  [142.6 kg] 142.6 kg (04/17 0500) Last BM Date: 03/05/20  Weight change: Filed Weights   03/07/20 0400 03/08/20 0500 03/09/20 0500  Weight: (!) 141.1 kg (!) 142.8 kg (!) 142.6 kg    Intake/Output:   Intake/Output Summary (Last 24 hours) at 03/09/2020 0915 Last data filed at 03/09/2020 0600 Gross per 24 hour  Intake 1559.36 ml  Output 2565 ml  Net -1005.64 ml      Physical Exam  CVP 15 General: NAD Neck: Thick, JVP 14 cm, no thyromegaly or thyroid nodule.  Lungs: Decreased at bases.  CV: Nondisplaced PMI.  Heart regular S1/S2, no S3/S4, no murmur.  1+ ankle edema.   Abdomen: Soft, nontender, no hepatosplenomegaly, no distention.  Skin: Intact without lesions or rashes.  Neurologic: Alert and oriented x 3.  Psych: Normal affect. Extremities: No clubbing or cyanosis.  HEENT: Normal.    Telemetry   NSR 80s-90s (personally reviewed)  EKG    N/a   Labs    CBC Recent Labs    03/08/20 1714 03/09/20 0444  WBC 14.1* 14.9*  HGB 8.3* 7.9*  HCT 25.8* 24.7*  MCV 89.3 90.8  PLT 157 [id number]   Basic Metabolic Panel Recent Labs    03/07/20 0422 03/07/20 1242 03/07/20 1608 03/08/20 0433 03/08/20 1714  03/09/20 0444  NA 138   < > 138   < > 136 137  K 4.3   < > 4.2   < > 4.5 4.2  CL 109  --  108   < > 104 104  CO2 19*  --  21*   < > 21* 25  GLUCOSE 184*  --  163*   < > 256* 119*  BUN 25*  --  28*   < > 27* 31*  CREATININE 1.38*  --  1.21*   < > 1.19* 1.23*  CALCIUM 8.4*  --  8.2*   < > 8.3* 8.1*  MG 2.7*  --  2.9*  --   --   --    < > = values in this interval not displayed.   Liver Function Tests No results for input(s): AST, ALT, ALKPHOS, BILITOT, PROT, ALBUMIN in the last 72 hours. No results for input(s): LIPASE, AMYLASE in the last 72 hours. Cardiac Enzymes No results for input(s): CKTOTAL, CKMB, CKMBINDEX, TROPONINI in the last 72 hours.  BNP: BNP (last 3 results) No results for input(s): BNP in the last 8760 hours.  ProBNP (last 3 results) No results for input(s): PROBNP in the last 8760 hours.   D-Dimer No results for input(s): DDIMER in the last 72 hours. Hemoglobin A1C No  results for input(s): HGBA1C in the last 72 hours. Fasting Lipid Panel No results for input(s): CHOL, HDL, LDLCALC, TRIG, CHOLHDL, LDLDIRECT in the last 72 hours. Thyroid Function Tests No results for input(s): TSH, T4TOTAL, T3FREE, THYROIDAB in the last 72 hours.  Invalid input(s): FREET3  Other results:   Imaging    No results found.   Medications:     Scheduled Medications: . acetaminophen  1,000 mg Oral Q6H   Or  . acetaminophen (TYLENOL) oral liquid 160 mg/5 mL  1,000 mg Per Tube Q6H  . amiodarone  400 mg Oral BID  . aspirin EC  81 mg Oral Daily  . atorvastatin  80 mg Oral q1800  . bisacodyl  10 mg Oral Daily   Or  . bisacodyl  10 mg Rectal Daily  . Chlorhexidine Gluconate Cloth  6 each Topical Daily  . clopidogrel  75 mg Oral Daily  . colchicine  0.3 mg Oral BID  . docusate sodium  200 mg Oral Daily  . furosemide  60 mg Intravenous BID  . insulin aspart  0-20 Units Subcutaneous Q4H  . insulin detemir  32 Units Subcutaneous BID  . isosorbide dinitrate  10 mg Oral TID   . metoprolol tartrate  12.5 mg Oral BID  . pantoprazole  40 mg Oral Daily  . sodium chloride flush  10-40 mL Intracatheter Q12H  . sodium chloride flush  3 mL Intravenous Q12H  . spironolactone  12.5 mg Oral Daily    Infusions: . sodium chloride 10 mL/hr at 03/09/20 0500  . sodium chloride    . sodium chloride 20 mL/hr at 03/06/20 1650  . cefTRIAXone (ROCEPHIN)  IV Stopped (03/08/20 1505)  . lactated ringers    . lactated ringers    . lactated ringers 20 mL/hr at 03/09/20 0500    PRN Medications: sodium chloride, dextrose, lactated ringers, metoprolol tartrate, ondansetron (ZOFRAN) IV, oxyCODONE, sodium chloride flush, sodium chloride flush, traMADol    Assessment/Plan   1. CAD: Admitted with NSTEMI.  LHC with 3VD, now s/p CABG x4 with LIMA-LAD, seq radial-D/OM, RIMA-PDA - Continue ASA, Plavix.  - On statin.  2. Acute systolic CHF: Ischemic cardiomyopathy.  Echo pre-op with EF 30%, normal RV. Weight remains up considerably from pre-op.  CVP 15 today.  BP stable.  Co-ox 57%.  - Would continue milrinone 0.125 for now while diuresing.  - Lasix 60 mg IV bid today.  - Add spironolactone 12.5 daily.  - Has PICC, can remove introducer.  - Eventual ARNI, Coreg as BP and creatinine allow.  3. DMII: Hgb A1C 10  - Continue SSI.  4. UTI: Urine culture 03/04/20 showed E Coli  - Ceftriaxone ongoing.  5. Anemia: expected post-surgical blood loss - Transfuse hgb < 7 at this point, 7.9 today.  6. CKD: Stage 3.  Stable today.   Length of Stay: 7  Loralie Champagne, MD  03/09/2020, 9:15 AM

## 2020-03-09 NOTE — Progress Notes (Signed)
3 Days Post-Op Procedure(s) (LRB): CORONARY ARTERY BYPASS GRAFTING (CABG) using LIMA to LAD, RIMA to PLB, Left Radial Artery Graft: RAG to Diag1; RAG to OM1. (N/A) RADIAL ARTERY HARVEST (Left) TRANSESOPHAGEAL ECHOCARDIOGRAM (TEE) (N/A) INDOCYANINE GREEN FLUORESCENCE IMAGING (ICG) (N/A) Subjective: No complaints Objective: Vital signs in last 24 hours: Temp:  [98.6 F (37 C)-99.3 F (37.4 C)] 98.8 F (37.1 C) (04/17 0000) Pulse Rate:  [80-106] 87 (04/17 0630) Cardiac Rhythm: Atrial paced (04/17 0400) Resp:  [10-29] 15 (04/17 0630) SpO2:  [89 %-100 %] 94 % (04/17 0630) Arterial Line BP: (102-156)/(58-92) 125/64 (04/17 0630) Weight:  [142.6 kg] 142.6 kg (04/17 0500)  Hemodynamic parameters for last 24 hours: PAP: (47-60)/(22-32) 59/30 CVP:  [10 mmHg-28 mmHg] 13 mmHg CO:  [4 L/min-7 L/min] 4.6 L/min CI:  [1.8 L/min/m2-3.1 L/min/m2] 2.1 L/min/m2  Intake/Output from previous day: 04/16 0701 - 04/17 0700 In: 1802 [P.O.:780; I.V.:921.9; IV Piggyback:100.1] Out: 3025 [Urine:2705; Chest Tube:320] Intake/Output this shift: No intake/output data recorded.  General appearance: alert and cooperative Neurologic: intact Heart: regular rate and rhythm, S1, S2 normal, no murmur, click, rub or gallop Lungs: clear to auscultation bilaterally Abdomen: soft, non-tender; bowel sounds normal; no masses,  no organomegaly Extremities: edema 2+ Wound: dressed  Lab Results: Recent Labs    03/08/20 1714 03/09/20 0444  WBC 14.1* 14.9*  HGB 8.3* 7.9*  HCT 25.8* 24.7*  PLT 157 155   BMET:  Recent Labs    03/08/20 1714 03/09/20 0444  NA 136 137  K 4.5 4.2  CL 104 104  CO2 21* 25  GLUCOSE 256* 119*  BUN 27* 31*  CREATININE 1.19* 1.23*  CALCIUM 8.3* 8.1*    PT/INR:  Recent Labs    03/06/20 1709  LABPROT 16.1*  INR 1.3*   ABG    Component Value Date/Time   PHART 7.429 03/08/2020 1715   HCO3 22.1 03/08/2020 1715   TCO2 22 03/07/2020 1242   ACIDBASEDEF 1.6 03/08/2020 1715   O2SAT 56.6 03/09/2020 0442   CBG (last 3)  Recent Labs    03/08/20 2036 03/08/20 2258 03/09/20 0438  GLUCAP 265* 217* 103*    Assessment/Plan: S/P Procedure(s) (LRB): CORONARY ARTERY BYPASS GRAFTING (CABG) using LIMA to LAD, RIMA to PLB, Left Radial Artery Graft: RAG to Diag1; RAG to OM1. (N/A) RADIAL ARTERY HARVEST (Left) TRANSESOPHAGEAL ECHOCARDIOGRAM (TEE) (N/A) INDOCYANINE GREEN FLUORESCENCE IMAGING (ICG) (N/A) Mobilize Diuresis remove tubes  Ok to take down Prevena   LOS: 7 days    Wonda Olds 03/09/2020

## 2020-03-10 ENCOUNTER — Inpatient Hospital Stay (HOSPITAL_COMMUNITY): Payer: Self-pay

## 2020-03-10 LAB — TYPE AND SCREEN
ABO/RH(D): B POS
Antibody Screen: NEGATIVE
Unit division: 0
Unit division: 0
Unit division: 0
Unit division: 0

## 2020-03-10 LAB — BPAM RBC
Blood Product Expiration Date: 202105102359
Blood Product Expiration Date: 202105102359
Blood Product Expiration Date: 202105102359
Blood Product Expiration Date: 202105102359
ISSUE DATE / TIME: 202104090824
ISSUE DATE / TIME: 202104151135
ISSUE DATE / TIME: 202104151135
Unit Type and Rh: 7300
Unit Type and Rh: 7300
Unit Type and Rh: 7300
Unit Type and Rh: 7300

## 2020-03-10 LAB — BASIC METABOLIC PANEL
Anion gap: 9 (ref 5–15)
Anion gap: 11 (ref 5–15)
BUN: 31 mg/dL — ABNORMAL HIGH (ref 8–23)
BUN: 29 mg/dL — ABNORMAL HIGH (ref 8–23)
CO2: 24 mmol/L (ref 22–32)
CO2: 26 mmol/L (ref 22–32)
Calcium: 8 mg/dL — ABNORMAL LOW (ref 8.9–10.3)
Calcium: 7.9 mg/dL — ABNORMAL LOW (ref 8.9–10.3)
Chloride: 103 mmol/L (ref 98–111)
Chloride: 101 mmol/L (ref 98–111)
Creatinine, Ser: 1.19 mg/dL — ABNORMAL HIGH (ref 0.44–1.00)
Creatinine, Ser: 1.18 mg/dL — ABNORMAL HIGH (ref 0.44–1.00)
GFR calc Af Amer: 56 mL/min — ABNORMAL LOW (ref >60)
GFR calc Af Amer: 56 mL/min — ABNORMAL LOW (ref >60)
GFR calc non Af Amer: 48 mL/min — ABNORMAL LOW (ref >60)
GFR calc non Af Amer: 49 mL/min — ABNORMAL LOW (ref >60)
Glucose, Bld: 83 mg/dL (ref 70–99)
Glucose, Bld: 130 mg/dL — ABNORMAL HIGH (ref 70–99)
Potassium: 3.6 mmol/L (ref 3.5–5.1)
Potassium: 3.7 mmol/L (ref 3.5–5.1)
Sodium: 136 mmol/L (ref 135–145)
Sodium: 138 mmol/L (ref 135–145)

## 2020-03-10 LAB — HEPATIC FUNCTION PANEL
ALT: 26 U/L (ref 0–44)
AST: 32 U/L (ref 15–41)
Albumin: 2.7 g/dL — ABNORMAL LOW (ref 3.5–5.0)
Alkaline Phosphatase: 76 U/L (ref 38–126)
Bilirubin, Direct: 0.1 mg/dL (ref 0.0–0.2)
Indirect Bilirubin: 0.7 mg/dL (ref 0.3–0.9)
Total Bilirubin: 0.8 mg/dL (ref 0.3–1.2)
Total Protein: 6.1 g/dL — ABNORMAL LOW (ref 6.5–8.1)

## 2020-03-10 LAB — COOXEMETRY PANEL
Carboxyhemoglobin: 0.8 % (ref 0.5–1.5)
Carboxyhemoglobin: 1.4 % (ref 0.5–1.5)
Methemoglobin: 0.6 % (ref 0.0–1.5)
Methemoglobin: 1.1 % (ref 0.0–1.5)
O2 Saturation: 41.9 %
O2 Saturation: 47.4 %
Total hemoglobin: 7.9 g/dL — ABNORMAL LOW (ref 12.0–16.0)
Total hemoglobin: 8.2 g/dL — ABNORMAL LOW (ref 12.0–16.0)

## 2020-03-10 LAB — GLUCOSE, CAPILLARY
Glucose-Capillary: 73 mg/dL (ref 70–99)
Glucose-Capillary: 84 mg/dL (ref 70–99)
Glucose-Capillary: 104 mg/dL — ABNORMAL HIGH (ref 70–99)
Glucose-Capillary: 104 mg/dL — ABNORMAL HIGH (ref 70–99)
Glucose-Capillary: 112 mg/dL — ABNORMAL HIGH (ref 70–99)
Glucose-Capillary: 92 mg/dL (ref 70–99)

## 2020-03-10 LAB — CBC
HCT: 24.8 % — ABNORMAL LOW (ref 36.0–46.0)
Hemoglobin: 8.1 g/dL — ABNORMAL LOW (ref 12.0–15.0)
MCH: 29.7 pg (ref 26.0–34.0)
MCHC: 32.7 g/dL (ref 30.0–36.0)
MCV: 90.8 fL (ref 80.0–100.0)
Platelets: 194 10*3/uL (ref 150–400)
RBC: 2.73 MIL/uL — ABNORMAL LOW (ref 3.87–5.11)
RDW: 15.8 % — ABNORMAL HIGH (ref 11.5–15.5)
WBC: 15 10*3/uL — ABNORMAL HIGH (ref 4.0–10.5)
nRBC: 0.5 % — ABNORMAL HIGH (ref 0.0–0.2)

## 2020-03-10 MED ORDER — POTASSIUM CHLORIDE 10 MEQ/50ML IV SOLN
10.0000 meq | INTRAVENOUS | Status: AC
  Administered 2020-03-10 (×2): 10 meq via INTRAVENOUS
  Filled 2020-03-10 (×2): qty 50

## 2020-03-10 MED ORDER — METOLAZONE 5 MG PO TABS
10.0000 mg | ORAL_TABLET | Freq: Once | ORAL | Status: AC
  Administered 2020-03-10: 11:00:00 10 mg via ORAL
  Filled 2020-03-10: qty 2

## 2020-03-10 MED ORDER — ENOXAPARIN SODIUM 40 MG/0.4ML ~~LOC~~ SOLN
40.0000 mg | SUBCUTANEOUS | Status: DC
  Administered 2020-03-10 – 2020-03-14 (×5): 40 mg via SUBCUTANEOUS
  Filled 2020-03-10 (×6): qty 0.4

## 2020-03-10 MED ORDER — POTASSIUM CHLORIDE CRYS ER 20 MEQ PO TBCR
20.0000 meq | EXTENDED_RELEASE_TABLET | ORAL | Status: AC
  Administered 2020-03-10 – 2020-03-11 (×3): 20 meq via ORAL
  Filled 2020-03-10 (×3): qty 1

## 2020-03-10 MED ORDER — MILRINONE LACTATE IN DEXTROSE 20-5 MG/100ML-% IV SOLN
0.1250 ug/kg/min | INTRAVENOUS | Status: DC
  Administered 2020-03-10 – 2020-03-12 (×3): 0.125 ug/kg/min via INTRAVENOUS
  Filled 2020-03-10 (×3): qty 100

## 2020-03-10 MED ORDER — SORBITOL 70 % SOLN: 30.0000 mL | Freq: Once | Status: DC

## 2020-03-10 MED ORDER — SODIUM CHLORIDE 0.9 % IV SOLN
25.0000 mg | Freq: Once | INTRAVENOUS | Status: AC
  Administered 2020-03-10: 25 mg via INTRAVENOUS
  Filled 2020-03-10: qty 2

## 2020-03-10 MED ORDER — INSULIN ASPART 100 UNIT/ML ~~LOC~~ SOLN
0.0000 [IU] | Freq: Three times a day (TID) | SUBCUTANEOUS | Status: DC
  Administered 2020-03-11 – 2020-03-12 (×4): 3 [IU] via SUBCUTANEOUS
  Administered 2020-03-12: 4 [IU] via SUBCUTANEOUS
  Administered 2020-03-13: 3 [IU] via SUBCUTANEOUS
  Administered 2020-03-13: 22:00:00 4 [IU] via SUBCUTANEOUS
  Administered 2020-03-14 (×2): 3 [IU] via SUBCUTANEOUS
  Administered 2020-03-14: 4 [IU] via SUBCUTANEOUS

## 2020-03-10 MED ORDER — DIGOXIN 125 MCG PO TABS
0.1250 mg | ORAL_TABLET | Freq: Every day | ORAL | Status: DC
  Administered 2020-03-10 – 2020-03-15 (×6): 0.125 mg via ORAL
  Filled 2020-03-10 (×6): qty 1

## 2020-03-10 MED ORDER — POTASSIUM CHLORIDE CRYS ER 20 MEQ PO TBCR
20.0000 meq | EXTENDED_RELEASE_TABLET | Freq: Two times a day (BID) | ORAL | Status: DC
  Administered 2020-03-10 – 2020-03-15 (×11): 20 meq via ORAL
  Filled 2020-03-10 (×11): qty 1

## 2020-03-10 MED ORDER — SPIRONOLACTONE 25 MG PO TABS
25.0000 mg | ORAL_TABLET | Freq: Every day | ORAL | Status: DC
  Administered 2020-03-11 – 2020-03-15 (×5): 25 mg via ORAL
  Filled 2020-03-10 (×5): qty 1

## 2020-03-10 MED ORDER — FUROSEMIDE 10 MG/ML IJ SOLN
80.0000 mg | Freq: Two times a day (BID) | INTRAMUSCULAR | Status: DC
  Administered 2020-03-10 – 2020-03-12 (×3): 80 mg via INTRAVENOUS
  Filled 2020-03-10 (×3): qty 8

## 2020-03-10 NOTE — Progress Notes (Signed)
CT surgery p.m. Rounds  Patient examined and record reviewed.Hemodynamics stable,labs satisfactory.Patient had stable day.Continue current care. Rhonda Roberts 03/10/2020

## 2020-03-10 NOTE — Evaluation (Signed)
Physical Therapy Evaluation Patient Details Name: Rhonda Roberts MRN: [id number] DOB: 1956/07/03 Today's Date: 03/10/2020   History of Present Illness  64 y.o. woman with a history of type 2 DM, HTN, dyslipidemia, morbid obesity, GERD, and arthritis who presented with intermittent chest pain in the setting of nausea, vomiting, and diarrhea for the past week. Came to ED 4/10 and found to have elevated troponin and ischemic ECG changes indicative of NSTEMI. Pt is s/p 4/14 CABGx4 and TEE.   Clinical Impression  PTA pt reports living with daughter (who works 12-9) in single story home with ramped entrance. Pt reports independence in mobility and ADLs/iADLs. Pt is currently limited in safe mobility by abdominal pain/pressure in addition to sternal pain, in presence of decreased balance, strength and endurance. Pt is minAx2 for sit>stand and min Ax2 for ambulation of 50 feet with RW. PT recommending SNF level rehab for pt to improve strength and endurance before returning home. PT will continue to follow acutely.     Follow Up Recommendations SNF    Equipment Recommendations  Other (comment)(TBD at next venue)    Recommendations for Other Services OT consult     Precautions / Restrictions Precautions Precautions: Sternal Precaution Comments: discussed "moving in the tube" Restrictions Weight Bearing Restrictions: Yes      Mobility  Bed Mobility               General bed mobility comments: OOB in recliner  Transfers Overall transfer level: Needs assistance Equipment used: 2 person hand held assist Transfers: Sit to/from Stand Sit to Stand: Min assist;+2 physical assistance         General transfer comment: minAx2 for power up and steadying with guidance of UE on EVA walker  Ambulation/Gait Ambulation/Gait assistance: +2 physical assistance;+2 safety/equipment;Min assist Gait Distance (Feet): 50 Feet Assistive device: (EVA walker) Gait Pattern/deviations: Step-through  pattern;Decreased step length - right;Decreased step length - left;Shuffle;Trunk flexed Gait velocity: slowed Gait velocity interpretation: <1.8 ft/sec, indicate of risk for recurrent falls General Gait Details: min A for steadying with +2 for close chair follow, pt with slow, mildly unsteady gait, vc for upright posture and proximity to RW.       Balance Overall balance assessment: Needs assistance Sitting-balance support: Feet supported;Bilateral upper extremity supported;Single extremity supported;No upper extremity supported Sitting balance-Leahy Scale: Fair     Standing balance support: During functional activity;Bilateral upper extremity supported Standing balance-Leahy Scale: Poor                               Pertinent Vitals/Pain Pain Assessment: Faces Faces Pain Scale: Hurts little more Pain Location: abdomen and sternum Pain Descriptors / Indicators: Aching;Sore;Pressure Pain Intervention(s): Limited activity within patient's tolerance;Monitored during session;Premedicated before session;Repositioned    Home Living Family/patient expects to be discharged to:: Private residence Living Arrangements: Children Available Help at Discharge: Family;Available 24 hours/day(daughter works 12-9, niece possibly to fill that time) Type of Home: House Home Access: Ramped entrance     [place]: One level Home Equipment: Grab bars - tub/shower      Prior Function Level of Independence: Independent         Comments: pt reports she was helping her brother with dementia in the mornings, ambulates without AD and independent with ADLs, daughter assists with some iADLs      Hand Dominance        Extremity/Trunk Assessment   Upper Extremity Assessment Upper Extremity Assessment: Generalized  weakness;LUE deficits/detail LUE Deficits / Details: vein harvest    Lower Extremity Assessment Lower Extremity Assessment: Generalized weakness       Communication    Communication: No difficulties  Cognition Arousal/Alertness: Awake/alert Behavior During Therapy: WFL for tasks assessed/performed Overall Cognitive Status: Within Functional Limits for tasks assessed                                        General Comments General comments (skin integrity, edema, etc.): Pt on RA with SaO2 96%O2 at end of ambulation, HR in low 90s        Assessment/Plan    PT Assessment Patient needs continued PT services  PT Problem List Decreased strength;Decreased activity tolerance;Decreased balance;Decreased range of motion;Decreased mobility;Decreased knowledge of use of DME;Cardiopulmonary status limiting activity;Pain       PT Treatment Interventions DME instruction;Gait training;Functional mobility training;Therapeutic activities;Therapeutic exercise;Balance training;Cognitive remediation;Patient/family education    PT Goals (Current goals can be found in the Care Plan section)  Acute Rehab PT Goals Patient Stated Goal: get back to taking care of her brother PT Goal Formulation: With patient Time For Goal Achievement: 03/24/20 Potential to Achieve Goals: Good    Frequency Min 2X/week    AM-PAC PT "6 Clicks" Mobility  Outcome Measure Help needed turning from your back to your side while in a flat bed without using bedrails?: A Lot Help needed moving from lying on your back to sitting on the side of a flat bed without using bedrails?: A Lot Help needed moving to and from a bed to a chair (including a wheelchair)?: A Lot Help needed standing up from a chair using your arms (e.g., wheelchair or bedside chair)?: A Lot Help needed to walk in hospital room?: A Lot Help needed climbing 3-5 steps with a railing? : Total 6 Click Score: 11    End of Session Equipment Utilized During Treatment: Gait belt Activity Tolerance: Patient tolerated treatment well Patient left: in chair;with call bell/phone within reach Nurse Communication:  Mobility status PT Visit Diagnosis: Unsteadiness on feet (R26.81);Other abnormalities of gait and mobility (R26.89);Muscle weakness (generalized) (M62.81);Difficulty in walking, not elsewhere classified (R26.2);Pain Pain - part of body: (sternum and abdomen)    Time: [id number] PT Time Calculation (min) (ACUTE ONLY): 27 min   Charges:   PT Evaluation $PT Eval Moderate Complexity: 1 Mod PT Treatments $Gait Training: 8-22 mins        Jimmie Rueter B. Migdalia Dk PT, DPT Acute Rehabilitation Services Pager [phone] Office [phone]   [place] 03/10/2020, 10:24 AM

## 2020-03-10 NOTE — Progress Notes (Signed)
4 Days Post-Op Procedure(s) (LRB): CORONARY ARTERY BYPASS GRAFTING (CABG) using LIMA to LAD, RIMA to PLB, Left Radial Artery Graft: RAG to Diag1; RAG to OM1. (N/A) RADIAL ARTERY HARVEST (Left) TRANSESOPHAGEAL ECHOCARDIOGRAM (TEE) (N/A) INDOCYANINE GREEN FLUORESCENCE IMAGING (ICG) (N/A) Subjective: Patient overall improved now able to walk in the hall some Mixed venous saturation is less than 50%-we will resume milrinone 0.125 as recommended by advanced heart failure Pulmonary status stable now on room air chest x-ray clear Surgical incisions improving and wound vacs have been removed Radial A-line removed Patient close to being able to transfer to [facility] Objective: Vital signs in last 24 hours: Temp:  [97.5 F (36.4 C)-99.7 F (37.6 C)] 98.3 F (36.8 C) (04/18 1100) Pulse Rate:  [76-90] 77 (04/18 1100) Cardiac Rhythm: Normal sinus rhythm (04/18 0800) Resp:  [15-27] 15 (04/18 1100) BP: (132)/(74) 132/74 (04/18 1100) SpO2:  [91 %-100 %] 100 % (04/18 1100) Arterial Line BP: (102-140)/(56-78) 140/70 (04/18 1000) Weight:  [140.2 kg] 140.2 kg (04/18 0500)  Hemodynamic parameters for last 24 hours: CVP:  [0 mmHg-30 mmHg] 15 mmHg  Intake/Output from previous day: 04/17 0701 - 04/18 0700 In: 656 [P.O.:500; I.V.:56; IV Piggyback:100] Out: 2700 [Urine:2700] Intake/Output this shift: Total I/O In: 51.7 [I.V.:27.9; IV Piggyback:23.8] Out: [id number] [Urine:1065]  Alert and comfortable sitting in chair Lungs clear Sternal incision clean dry Lower leg edema improved Lab Results: Recent Labs    03/09/20 0444 03/10/20 0410  WBC 14.9* 15.0*  HGB 7.9* 8.1*  HCT 24.7* 24.8*  PLT 155 194   BMET:  Recent Labs    03/09/20 0444 03/10/20 0410  NA 137 136  K 4.2 3.6  CL 104 103  CO2 25 24  GLUCOSE 119* 83  BUN 31* 31*  CREATININE 1.23* 1.19*  CALCIUM 8.1* 8.0*    PT/INR: No results for input(s): LABPROT, INR in the last 72 hours. ABG    Component Value Date/Time    PHART 7.429 03/08/2020 1715   HCO3 22.1 03/08/2020 1715   TCO2 22 03/07/2020 1242   ACIDBASEDEF 1.6 03/08/2020 1715   O2SAT 47.4 03/10/2020 1011   CBG (last 3)  Recent Labs    03/10/20 0439 03/10/20 0803 03/10/20 1153  GLUCAP 73 84 104*    Assessment/Plan: S/P Procedure(s) (LRB): CORONARY ARTERY BYPASS GRAFTING (CABG) using LIMA to LAD, RIMA to PLB, Left Radial Artery Graft: RAG to Diag1; RAG to OM1. (N/A) RADIAL ARTERY HARVEST (Left) TRANSESOPHAGEAL ECHOCARDIOGRAM (TEE) (N/A) INDOCYANINE GREEN FLUORESCENCE IMAGING (ICG) (N/A) Mobilize Diuresis Diabetes control Continue low-dose milrinone recheck Cholox in a.m.   LOS: 8 days    Rhonda Roberts 03/10/2020

## 2020-03-10 NOTE — Progress Notes (Addendum)
Patient ID: Rhonda Roberts, female   DOB: Apr 25, 1956, 64 y.o.   MRN: [id number]     Advanced Heart Failure Rounding Note  PCP-Cardiologist: Skeet Latch, MD   Subjective:    - S/P CABG x4  4/14 - Extubated 4/15.   Co-ox dropped to 42% this am. 47% on recheck. Seen by Dr. Darcey Nora and milrinone started.   Weight down 5 pounds on IV lasix but CVP remains 15-16. Drinking a lot of fluids.   Feels weak. Mildly SOB. Good BM overnight and belly feels better.   Objective:   Weight Range: (!) 140.2 kg Body mass index is 56.53 kg/m.   Vital Signs:   Temp:  [97.5 F (36.4 C)-99.7 F (37.6 C)] 98.3 F (36.8 C) (04/18 1100) Pulse Rate:  [76-90] 77 (04/18 1100) Resp:  [15-27] 15 (04/18 1100) BP: (132)/(74) 132/74 (04/18 1100) SpO2:  [91 %-100 %] 100 % (04/18 1100) Arterial Line BP: (102-140)/(56-78) 140/70 (04/18 1000) Weight:  [140.2 kg] 140.2 kg (04/18 0500) Last BM Date: 03/09/20  Weight change: Filed Weights   03/08/20 0500 03/09/20 0500 03/10/20 0500  Weight: (!) 142.8 kg (!) 142.6 kg (!) 140.2 kg    Intake/Output:   Intake/Output Summary (Last 24 hours) at 03/10/2020 1226 Last data filed at 03/10/2020 1100 Gross per 24 hour  Intake 407.65 ml  Output 2895 ml  Net -2487.35 ml      Physical Exam   CVP 15 General:  Obese woman sitting in chair  No resp difficulty HEENT: normal Neck: supple. JVP to jaw. Carotids 2+ bilat; no bruits. No lymphadenopathy or thryomegaly appreciated. Cor: Sternal incision ok PMI nondisplaced. Regular rate & rhythm. No rubs, gallops or murmurs. Lungs: decreased at bases  Abdomen: obese soft, nontender, + distended. No hepatosplenomegaly. No bruits or masses. Good bowel sounds. Extremities: no cyanosis, clubbing, rash, 1-2+ edema Neuro: alert & orientedx3, cranial nerves grossly intact. moves all 4 extremities w/o difficulty. Affect pleasant    Telemetry   NSR 70-80s (personally reviewed)  EKG    N/a   Labs    CBC Recent  Labs    03/09/20 0444 03/10/20 0410  WBC 14.9* 15.0*  HGB 7.9* 8.1*  HCT 24.7* 24.8*  MCV 90.8 90.8  PLT 155 [id number]   Basic Metabolic Panel Recent Labs    03/07/20 1608 03/08/20 0433 03/09/20 0444 03/10/20 0410  NA 138   < > 137 136  K 4.2   < > 4.2 3.6  CL 108   < > 104 103  CO2 21*   < > 25 24  GLUCOSE 163*   < > 119* 83  BUN 28*   < > 31* 31*  CREATININE 1.21*   < > 1.23* 1.19*  CALCIUM 8.2*   < > 8.1* 8.0*  MG 2.9*  --   --   --    < > = values in this interval not displayed.   Liver Function Tests Recent Labs    03/10/20 0410  AST 32  ALT 26  ALKPHOS 76  BILITOT 0.8  PROT 6.1*  ALBUMIN 2.7*   No results for input(s): LIPASE, AMYLASE in the last 72 hours. Cardiac Enzymes No results for input(s): CKTOTAL, CKMB, CKMBINDEX, TROPONINI in the last 72 hours.  BNP: BNP (last 3 results) No results for input(s): BNP in the last 8760 hours.  ProBNP (last 3 results) No results for input(s): PROBNP in the last 8760 hours.   D-Dimer No results for input(s): DDIMER in the last  72 hours. Hemoglobin A1C No results for input(s): HGBA1C in the last 72 hours. Fasting Lipid Panel No results for input(s): CHOL, HDL, LDLCALC, TRIG, CHOLHDL, LDLDIRECT in the last 72 hours. Thyroid Function Tests No results for input(s): TSH, T4TOTAL, T3FREE, THYROIDAB in the last 72 hours.  Invalid input(s): FREET3  Other results:   Imaging    DG Chest Port 1 View  Result Date: 03/10/2020 CLINICAL DATA:  Chest pain. EXAM: PORTABLE CHEST 1 VIEW COMPARISON:  Yesterday. FINDINGS: Stable right PICC with its tip in the inferior aspect of the superior vena cava approximately 1 cm above the superior cavoatrial junction. The mediastinal and left chest tubes have been removed. The right jugular catheter sheath has been removed. Stable post CABG changes. Stable enlarged cardiac silhouette and mildly prominent interstitial markings no Kerley lines or pleural fluid. Lower thoracic spine  degenerative changes. IMPRESSION: 1. Stable right PICC. 2. Stable cardiomegaly and mild chronic interstitial lung disease. 3. No acute abnormality. Electronically Signed   By: Claudie Revering M.D.   On: 03/10/2020 10:31     Medications:     Scheduled Medications: . acetaminophen  1,000 mg Oral Q6H   Or  . acetaminophen (TYLENOL) oral liquid 160 mg/5 mL  1,000 mg Per Tube Q6H  . amiodarone  400 mg Oral BID  . aspirin EC  81 mg Oral Daily  . atorvastatin  80 mg Oral q1800  . bisacodyl  10 mg Oral Daily   Or  . bisacodyl  10 mg Rectal Daily  . Chlorhexidine Gluconate Cloth  6 each Topical Daily  . clopidogrel  75 mg Oral Daily  . colchicine  0.3 mg Oral BID  . docusate sodium  200 mg Oral Daily  . enoxaparin (LOVENOX) injection  40 mg Subcutaneous Q24H  . furosemide  60 mg Intravenous BID  . insulin aspart  0-20 Units Subcutaneous TID AC & HS  . insulin detemir  32 Units Subcutaneous BID  . isosorbide dinitrate  10 mg Oral TID  . metoprolol tartrate  12.5 mg Oral BID  . pantoprazole  40 mg Oral Daily  . potassium chloride  20 mEq Oral BID  . sodium chloride flush  10-40 mL Intracatheter Q12H  . sodium chloride flush  3 mL Intravenous Q12H  . spironolactone  12.5 mg Oral Daily    Infusions: . sodium chloride Stopped (03/09/20 1512)  . sodium chloride    . sodium chloride 20 mL/hr at 03/10/20 1029  . cefTRIAXone (ROCEPHIN)  IV Stopped (03/09/20 1543)  . lactated ringers Stopped (03/09/20 0747)  . milrinone 0.125 mcg/kg/min (03/10/20 1225)    PRN Medications: sodium chloride, dextrose, metoprolol tartrate, ondansetron (ZOFRAN) IV, oxyCODONE, sodium chloride flush, sodium chloride flush, traMADol    Assessment/Plan   1. CAD: Admitted with NSTEMI.  LHC with 3VD, now s/p CABG x4 with LIMA-LAD, seq radial-D/OM, RIMA-PDA - Continue ASA, Plavix.  - On statin.  2. Acute systolic CHF: Ischemic cardiomyopathy.  Echo pre-op with EF 30%, normal RV. Weight still up 22 pounds from  pre-op. Co-ox low this am so milrinone restarted. CVP remains high at 15 - Would continue milrinone 0.125 for now while diuresing. - Add digoxin 0.125. - Stop metoprolol.  - Increase IV lasix to 80 bid. Supp K - Increase spiro to 25 daily.  - Eventual ARNI, Coreg as BP and creatinine allow.  - she has 7 drinks on her tray, Discussed need to limit fluid intake 3. DMII: Hgb A1C 10  - Continue SSI.  4. UTI: Urine culture 03/04/20 showed E Coli  - Ceftriaxone ongoing.  5. Anemia: expected post-surgical blood loss - Transfuse hgb < 7 at this point, 8.1 today.  6. CKD: Stage 3a.  Stable today at 1.19.  7. Rhythm stable - unclear why she is on amio (unless for AF avoidance post-op based on PAPA BEAR trial) 8. Hypokalemia  - supped  Length of Stay: [place], MD  03/10/2020, 12:26 PM

## 2020-03-11 ENCOUNTER — Inpatient Hospital Stay (HOSPITAL_COMMUNITY): Payer: Self-pay

## 2020-03-11 LAB — CBC
HCT: 25.2 % — ABNORMAL LOW (ref 36.0–46.0)
Hemoglobin: 8.1 g/dL — ABNORMAL LOW (ref 12.0–15.0)
MCH: 28.9 pg (ref 26.0–34.0)
MCHC: 32.1 g/dL (ref 30.0–36.0)
MCV: 90 fL (ref 80.0–100.0)
Platelets: 228 10*3/uL (ref 150–400)
RBC: 2.8 MIL/uL — ABNORMAL LOW (ref 3.87–5.11)
RDW: 15.8 % — ABNORMAL HIGH (ref 11.5–15.5)
WBC: 11.5 10*3/uL — ABNORMAL HIGH (ref 4.0–10.5)
nRBC: 0.7 % — ABNORMAL HIGH (ref 0.0–0.2)

## 2020-03-11 LAB — BASIC METABOLIC PANEL
Anion gap: 12 (ref 5–15)
BUN: 30 mg/dL — ABNORMAL HIGH (ref 8–23)
CO2: 27 mmol/L (ref 22–32)
Calcium: 8.4 mg/dL — ABNORMAL LOW (ref 8.9–10.3)
Chloride: 98 mmol/L (ref 98–111)
Creatinine, Ser: 1.31 mg/dL — ABNORMAL HIGH (ref 0.44–1.00)
GFR calc Af Amer: 50 mL/min — ABNORMAL LOW (ref >60)
GFR calc non Af Amer: 43 mL/min — ABNORMAL LOW (ref >60)
Glucose, Bld: 103 mg/dL — ABNORMAL HIGH (ref 70–99)
Potassium: 4 mmol/L (ref 3.5–5.1)
Sodium: 137 mmol/L (ref 135–145)

## 2020-03-11 LAB — PREPARE RBC (CROSSMATCH)

## 2020-03-11 LAB — COOXEMETRY PANEL
Carboxyhemoglobin: 1.6 % — ABNORMAL HIGH (ref 0.5–1.5)
Methemoglobin: 1.3 % (ref 0.0–1.5)
O2 Saturation: 57.7 %
Total hemoglobin: 8.7 g/dL — ABNORMAL LOW (ref 12.0–16.0)

## 2020-03-11 LAB — GLUCOSE, CAPILLARY
Glucose-Capillary: 63 mg/dL — ABNORMAL LOW (ref 70–99)
Glucose-Capillary: 147 mg/dL — ABNORMAL HIGH (ref 70–99)
Glucose-Capillary: 99 mg/dL (ref 70–99)
Glucose-Capillary: 150 mg/dL — ABNORMAL HIGH (ref 70–99)

## 2020-03-11 MED ORDER — SODIUM CHLORIDE 0.9% IV SOLUTION: Freq: Once | INTRAVENOUS | Status: DC

## 2020-03-11 MED ORDER — SACUBITRIL-VALSARTAN 24-26 MG PO TABS
1.0000 | ORAL_TABLET | Freq: Two times a day (BID) | ORAL | Status: DC
  Administered 2020-03-11 – 2020-03-15 (×9): 1 via ORAL
  Filled 2020-03-11 (×11): qty 1

## 2020-03-11 NOTE — Progress Notes (Signed)
Inpatient Rehabilitation Admissions Coordinator  Inpatient rehab consul received. I will meet with patient at bedside tomorrow for rehab assessment. I have ordered OT eval.  Danne Baxter, RN, MSN Rehab Admissions Coordinator [phone] 03/11/2020 4:43 PM

## 2020-03-11 NOTE — Progress Notes (Signed)
Report called to [facility] 2C.

## 2020-03-11 NOTE — Progress Notes (Signed)
Patient ID: Rhonda Roberts, female   DOB: [birth year], 64 y.o.   MRN: [id number]     Advanced Heart Failure Rounding Note  PCP-Cardiologist: Skeet Latch, MD   Subjective:    - S/P CABG x4  4/14 - Extubated 4/15.   Co-ox 58% on milrinone 0.125.   Weight down 7 pounds on IV Lasix.  CVP not hooked up.    Breathing better.   Objective:   Weight Range: (!) 137.1 kg Body mass index is 55.28 kg/m.   Vital Signs:   Temp:  [97.5 F (36.4 C)-98.8 F (37.1 C)] 98.2 F (36.8 C) (04/19 0700) Pulse Rate:  [73-95] 92 (04/19 0700) Resp:  [11-26] 22 (04/19 0700) BP: (115-153)/(63-78) 115/73 (04/19 0600) SpO2:  [95 %-100 %] 99 % (04/19 0700) Arterial Line BP: (129-140)/(69-75) 140/70 (04/18 1000) Weight:  [137.1 kg] 137.1 kg (04/19 0500) Last BM Date: 03/09/20  Weight change: Filed Weights   03/09/20 0500 03/10/20 0500 03/11/20 0500  Weight: (!) 142.6 kg (!) 140.2 kg (!) 137.1 kg    Intake/Output:   Intake/Output Summary (Last 24 hours) at 03/11/2020 0749 Last data filed at 03/11/2020 0600 Gross per 24 hour  Intake 374.29 ml  Output 4550 ml  Net -4175.71 ml      Physical Exam   General: NAD Neck: JVP 10-12 cm, no thyromegaly or thyroid nodule.  Lungs: Clear to auscultation bilaterally with normal respiratory effort. CV: Nondisplaced PMI.  Heart regular S1/S2, no S3/S4, no murmur.  No peripheral edema.   Abdomen: Soft, nontender, no hepatosplenomegaly, no distention.  Skin: Intact without lesions or rashes.  Neurologic: Alert and oriented x 3.  Psych: Normal affect. Extremities: No clubbing or cyanosis.  HEENT: Normal.    Telemetry   NSR 70-80s (personally reviewed)  EKG    N/a   Labs    CBC Recent Labs    03/10/20 0410 03/11/20 0500  WBC 15.0* 11.5*  HGB 8.1* 8.1*  HCT 24.8* 25.2*  MCV 90.8 90.0  PLT 194 [id number]   Basic Metabolic Panel Recent Labs    03/10/20 1559 03/11/20 0500  NA 138 137  K 3.7 4.0  CL 101 98  CO2 26 27  GLUCOSE 130*  103*  BUN 29* 30*  CREATININE 1.18* 1.31*  CALCIUM 7.9* 8.4*   Liver Function Tests Recent Labs    03/10/20 0410  AST 32  ALT 26  ALKPHOS 76  BILITOT 0.8  PROT 6.1*  ALBUMIN 2.7*   No results for input(s): LIPASE, AMYLASE in the last 72 hours. Cardiac Enzymes No results for input(s): CKTOTAL, CKMB, CKMBINDEX, TROPONINI in the last 72 hours.  BNP: BNP (last 3 results) No results for input(s): BNP in the last 8760 hours.  ProBNP (last 3 results) No results for input(s): PROBNP in the last 8760 hours.   D-Dimer No results for input(s): DDIMER in the last 72 hours. Hemoglobin A1C No results for input(s): HGBA1C in the last 72 hours. Fasting Lipid Panel No results for input(s): CHOL, HDL, LDLCALC, TRIG, CHOLHDL, LDLDIRECT in the last 72 hours. Thyroid Function Tests No results for input(s): TSH, T4TOTAL, T3FREE, THYROIDAB in the last 72 hours.  Invalid input(s): FREET3  Other results:   Imaging    No results found.   Medications:     Scheduled Medications: . sodium chloride   Intravenous Once  . acetaminophen  1,000 mg Oral Q6H   Or  . acetaminophen (TYLENOL) oral liquid 160 mg/5 mL  1,000 mg Per Tube Q6H  .  amiodarone  400 mg Oral BID  . aspirin EC  81 mg Oral Daily  . atorvastatin  80 mg Oral q1800  . bisacodyl  10 mg Oral Daily   Or  . bisacodyl  10 mg Rectal Daily  . Chlorhexidine Gluconate Cloth  6 each Topical Daily  . clopidogrel  75 mg Oral Daily  . colchicine  0.3 mg Oral BID  . digoxin  0.125 mg Oral Daily  . docusate sodium  200 mg Oral Daily  . enoxaparin (LOVENOX) injection  40 mg Subcutaneous Q24H  . furosemide  80 mg Intravenous BID  . insulin aspart  0-20 Units Subcutaneous TID AC & HS  . insulin detemir  32 Units Subcutaneous BID  . isosorbide dinitrate  10 mg Oral TID  . pantoprazole  40 mg Oral Daily  . potassium chloride  20 mEq Oral BID  . sacubitril-valsartan  1 tablet Oral BID  . sodium chloride flush  10-40 mL Intracatheter  Q12H  . sodium chloride flush  3 mL Intravenous Q12H  . spironolactone  25 mg Oral Daily    Infusions: . sodium chloride Stopped (03/09/20 1512)  . sodium chloride    . sodium chloride 20 mL/hr at 03/10/20 1029  . cefTRIAXone (ROCEPHIN)  IV Stopped (03/10/20 1448)  . lactated ringers Stopped (03/09/20 0747)  . milrinone 0.125 mcg/kg/min (03/11/20 0600)    PRN Medications: sodium chloride, dextrose, metoprolol tartrate, ondansetron (ZOFRAN) IV, oxyCODONE, sodium chloride flush, sodium chloride flush, traMADol    Assessment/Plan   1. CAD: Admitted with NSTEMI.  LHC with 3VD, now s/p CABG x4 with LIMA-LAD, seq radial-D/OM, RIMA-PDA - Continue ASA, Plavix.  - On statin.  2. Acute systolic CHF: Ischemic cardiomyopathy.  Echo pre-op with EF 30%, normal RV. No CVP hooked up this morning but still looks like she has some volume overload. Co-ox 58% on milrinone 0.125. - Would continue milrinone 0.125 for now while actively diuresing.  - Continue digoxin 0.125. - Start Entresto 24/26 bid with stable BP.   - Continue Lasix 80 IV bid today, hook up CVP and follow.  - Continue spironolactone 25 daily.  3. DMII: Hgb A1C 10  - Continue SSI.  4. UTI: Urine culture 03/04/20 showed E Coli  - Ceftriaxone ongoing.  5. Anemia: expected post-surgical blood loss - Transfuse hgb < 7 at this point, 8.1 today.  6. CKD: Stage 3a, 1.3 today.  7. Rhythm stable - unclear why she is on amio (unless for AF avoidance post-op based on PAPA BEAR trial)  Length of Stay: 9  Loralie Champagne, MD  03/11/2020, 7:49 AM

## 2020-03-11 NOTE — Progress Notes (Signed)
Inpatient Diabetes Program Recommendations  AACE/ADA: New Consensus Statement on Inpatient Glycemic Control (2015)  Target Ranges:  Prepandial:   less than 140 mg/dL      Peak postprandial:   less than 180 mg/dL (1-2 hours)      Critically ill patients:  140 - 180 mg/dL   Lab Results  Component Value Date   GLUCAP 147 (H) 03/11/2020   HGBA1C 10.0 (H) 03/02/2020    Review of Glycemic Control Results for Rhonda Roberts, Rhonda Roberts (MRN [id number]) as of 03/11/2020 13:20  Ref. Range 03/10/2020 08:03 03/10/2020 11:53 03/10/2020 16:17 03/10/2020 20:09 03/10/2020 23:04 03/11/2020 06:47 03/11/2020 11:52  Glucose-Capillary Latest Ref Range: 70 - 99 mg/dL 84 104 (H) 104 (H) 112 (H) 92 63 (L) 147 (H)    Inpatient Diabetes Program Recommendations:   -Decrease Levemir to 30 units bid -Decrease Novolog correction to moderate correction -Add Novolog 2 units tid meal coverage if eats 50% meals  Thank you, Nani Gasser. Katrianna Friesenhahn, RN, MSN, CDE  Diabetes Coordinator Inpatient Glycemic Control Team Team Pager [phone] (8am-5pm) 03/11/2020 1:23 PM

## 2020-03-11 NOTE — Progress Notes (Signed)
Patient ID: Rhonda Roberts, female   DOB: 02-07-1956, 64 y.o.   MRN: [id number] EVENING ROUNDS NOTE :     [address]       [place] [address]             [phone]                 5 Days Post-Op Procedure(s) (LRB): CORONARY ARTERY BYPASS GRAFTING (CABG) using LIMA to LAD, RIMA to PLB, Left Radial Artery Graft: RAG to Diag1; RAG to OM1. (N/A) RADIAL ARTERY HARVEST (Left) TRANSESOPHAGEAL ECHOCARDIOGRAM (TEE) (N/A) INDOCYANINE GREEN FLUORESCENCE IMAGING (ICG) (N/A)  Total Length of Stay:  LOS: 9 days  BP 130/67   Pulse 87   Temp 98.3 F (36.8 C)   Resp (!) 24   Ht 5\' 2"  (1.575 m)   Wt (!) 137.1 kg   SpO2 98%   BMI 55.28 kg/m   .Intake/Output      04/18 0701 - 04/19 0700 04/19 0701 - 04/20 0700   P.O.  325   I.V. (mL/kg) 120.2 (0.9) 132.6 (1)   Blood  315   IV Piggyback 254.1 100   Total Intake(mL/kg) 374.3 (2.7) 872.6 (6.4)   Urine (mL/kg/hr) 4565 (1.4) 575 (0.4)   Stool 0 1   Total Output 4565 576   Net -4190.7 +296.6        Urine Occurrence  1 x   Stool Occurrence 1 x      . sodium chloride Stopped (03/09/20 1512)  . sodium chloride    . milrinone 0.125 mcg/kg/min (03/11/20 1600)     Lab Results  Component Value Date   WBC 11.5 (H) 03/11/2020   HGB 8.1 (L) 03/11/2020   HCT 25.2 (L) 03/11/2020   PLT 228 03/11/2020   GLUCOSE 103 (H) 03/11/2020   CHOL 216 (H) 03/02/2020   TRIG 106 03/02/2020   HDL 36 (L) 03/02/2020   LDLCALC 159 (H) 03/02/2020   ALT 26 03/10/2020   AST 32 03/10/2020   NA 137 03/11/2020   K 4.0 03/11/2020   CL 98 03/11/2020   CREATININE 1.31 (H) 03/11/2020   BUN 30 (H) 03/11/2020   CO2 27 03/11/2020   TSH 0.870 03/05/2020   INR 1.3 (H) 03/06/2020   HGBA1C 10.0 (H) 03/02/2020   Stable , trouble with mobility   Grace Isaac MD  Beeper [phone] Office [phone] 03/11/2020 6:19 PM

## 2020-03-11 NOTE — Progress Notes (Signed)
Right TLC PICC dressing and caps changed via sterile protocol w/Danielle,RN prior to transfer to 2C01.

## 2020-03-12 LAB — BASIC METABOLIC PANEL
Anion gap: 9 (ref 5–15)
BUN: 23 mg/dL (ref 8–23)
CO2: 31 mmol/L (ref 22–32)
Calcium: 8.5 mg/dL — ABNORMAL LOW (ref 8.9–10.3)
Chloride: 97 mmol/L — ABNORMAL LOW (ref 98–111)
Creatinine, Ser: 1.26 mg/dL — ABNORMAL HIGH (ref 0.44–1.00)
GFR calc Af Amer: 52 mL/min — ABNORMAL LOW (ref >60)
GFR calc non Af Amer: 45 mL/min — ABNORMAL LOW (ref >60)
Glucose, Bld: 71 mg/dL (ref 70–99)
Potassium: 3.9 mmol/L (ref 3.5–5.1)
Sodium: 137 mmol/L (ref 135–145)

## 2020-03-12 LAB — GLUCOSE, CAPILLARY
Glucose-Capillary: 69 mg/dL — ABNORMAL LOW (ref 70–99)
Glucose-Capillary: 101 mg/dL — ABNORMAL HIGH (ref 70–99)
Glucose-Capillary: 138 mg/dL — ABNORMAL HIGH (ref 70–99)
Glucose-Capillary: 134 mg/dL — ABNORMAL HIGH (ref 70–99)
Glucose-Capillary: 172 mg/dL — ABNORMAL HIGH (ref 70–99)

## 2020-03-12 LAB — CBC
HCT: 30.5 % — ABNORMAL LOW (ref 36.0–46.0)
Hemoglobin: 9.9 g/dL — ABNORMAL LOW (ref 12.0–15.0)
MCH: 28.2 pg (ref 26.0–34.0)
MCHC: 32.5 g/dL (ref 30.0–36.0)
MCV: 86.9 fL (ref 80.0–100.0)
Platelets: 295 10*3/uL (ref 150–400)
RBC: 3.51 MIL/uL — ABNORMAL LOW (ref 3.87–5.11)
RDW: 16.4 % — ABNORMAL HIGH (ref 11.5–15.5)
WBC: 13 10*3/uL — ABNORMAL HIGH (ref 4.0–10.5)
nRBC: 0.8 % — ABNORMAL HIGH (ref 0.0–0.2)

## 2020-03-12 LAB — TYPE AND SCREEN
ABO/RH(D): B POS
Antibody Screen: NEGATIVE
Unit division: 0

## 2020-03-12 LAB — COOXEMETRY PANEL
Carboxyhemoglobin: 1.8 % — ABNORMAL HIGH (ref 0.5–1.5)
Methemoglobin: 1.1 % (ref 0.0–1.5)
O2 Saturation: 65.7 %
Total hemoglobin: 10.2 g/dL — ABNORMAL LOW (ref 12.0–16.0)

## 2020-03-12 LAB — BPAM RBC
Blood Product Expiration Date: 202105092359
ISSUE DATE / TIME: 202104191243
Unit Type and Rh: 7300

## 2020-03-12 MED ORDER — TORSEMIDE 20 MG PO TABS
40.0000 mg | ORAL_TABLET | Freq: Every day | ORAL | Status: DC
  Administered 2020-03-13: 09:00:00 40 mg via ORAL
  Filled 2020-03-12: qty 2

## 2020-03-12 MED ORDER — AMIODARONE HCL 200 MG PO TABS
200.0000 mg | ORAL_TABLET | Freq: Two times a day (BID) | ORAL | Status: DC
  Administered 2020-03-12 – 2020-03-15 (×6): 200 mg via ORAL
  Filled 2020-03-12 (×7): qty 1

## 2020-03-12 MED FILL — Potassium Chloride Inj 2 mEq/ML: INTRAVENOUS | Qty: 40 | Status: AC

## 2020-03-12 MED FILL — Heparin Sodium (Porcine) Inj 1000 Unit/ML: INTRAMUSCULAR | Qty: 30 | Status: AC

## 2020-03-12 MED FILL — Magnesium Sulfate Inj 50%: INTRAMUSCULAR | Qty: 10 | Status: AC

## 2020-03-12 NOTE — Progress Notes (Addendum)
Patient ID: Rhonda Roberts, female   DOB: 04-13-1956, 64 y.o.   MRN: [id number]     Advanced Heart Failure Rounding Note  PCP-Cardiologist: Skeet Latch, MD   Subjective:    - S/P CABG x4  4/14 - Extubated 4/15.   Yesterday diuresed with IV lasix. Negative 3.1 liters. Weight down another 6 pounds.   Milrinone was stopped this morning. CO-OX 66%.   Complaining of leg fatigue. Denies SOB.    Objective:   Weight Range: 134.3 kg Body mass index is 54.15 kg/m.   Vital Signs:   Temp:  [98.1 F (36.7 C)-98.6 F (37 C)] 98.1 F (36.7 C) (04/20 0352) Pulse Rate:  [84-94] 89 (04/20 0506) Resp:  [15-26] 16 (04/20 0506) BP: (99-159)/(48-97) 99/51 (04/20 0352) SpO2:  [94 %-100 %] 95 % (04/20 0506) Weight:  [134.3 kg] 134.3 kg (04/20 0526) Last BM Date: 03/11/20  Weight change: Filed Weights   03/10/20 0500 03/11/20 0500 03/12/20 0526  Weight: (!) 140.2 kg (!) 137.1 kg 134.3 kg    Intake/Output:   Intake/Output Summary (Last 24 hours) at 03/12/2020 0731 Last data filed at 03/12/2020 0542 Gross per 24 hour  Intake 947.73 ml  Output 4101 ml  Net -3153.27 ml      Physical Exam  CVP 3 personally checked.  General:  In bed.  No resp difficulty HEENT: normal Neck: supple. no JVD. Carotids 2+ bilat; no bruits. No lymphadenopathy or thryomegaly appreciated. Cor: PMI nondisplaced. Regular rate & rhythm. No rubs, gallops or murmurs. Sternal dressing intact.  Lungs: clear Abdomen: soft, nontender, nondistended. No hepatosplenomegaly. No bruits or masses. Good bowel sounds. Extremities: no cyanosis, clubbing, rash, edema. LUE incision approximated edematous 2+ radial pulse. RUE triple lumen PICC   Neuro: alert & orientedx3, cranial nerves grossly intact. moves all 4 extremities w/o difficulty. Affect pleasant GU: Foley    Telemetry   NSR 70-80s personally checked.   EKG    N/a   Labs    CBC Recent Labs    03/11/20 0500 03/12/20 0510  WBC 11.5* 13.0*  HGB  8.1* 9.9*  HCT 25.2* 30.5*  MCV 90.0 86.9  PLT 228 [id number]   Basic Metabolic Panel Recent Labs    03/11/20 0500 03/12/20 0510  NA 137 137  K 4.0 3.9  CL 98 97*  CO2 27 31  GLUCOSE 103* 71  BUN 30* 23  CREATININE 1.31* 1.26*  CALCIUM 8.4* 8.5*   Liver Function Tests Recent Labs    03/10/20 0410  AST 32  ALT 26  ALKPHOS 76  BILITOT 0.8  PROT 6.1*  ALBUMIN 2.7*   No results for input(s): LIPASE, AMYLASE in the last 72 hours. Cardiac Enzymes No results for input(s): CKTOTAL, CKMB, CKMBINDEX, TROPONINI in the last 72 hours.  BNP: BNP (last 3 results) No results for input(s): BNP in the last 8760 hours.  ProBNP (last 3 results) No results for input(s): PROBNP in the last 8760 hours.   D-Dimer No results for input(s): DDIMER in the last 72 hours. Hemoglobin A1C No results for input(s): HGBA1C in the last 72 hours. Fasting Lipid Panel No results for input(s): CHOL, HDL, LDLCALC, TRIG, CHOLHDL, LDLDIRECT in the last 72 hours. Thyroid Function Tests No results for input(s): TSH, T4TOTAL, T3FREE, THYROIDAB in the last 72 hours.  Invalid input(s): FREET3  Other results:   Imaging    DG Chest 2 View  Result Date: 03/11/2020 CLINICAL DATA:  CABG 03/06/2020. EXAM: CHEST - 2 VIEW COMPARISON:  Most recent  radiograph yesterday. FINDINGS: Right upper extremity PICC with tip in the SVC. Post median sternotomy. Midline skin staples also seen. Cardiomegaly is unchanged. Interstitial thickening which is unchanged from prior exam. No definite pneumothorax. Probable pleural thickening/extrapleural flat projects over the upper lung zones. No evidence of right pneumothorax. Small pleural effusions. IMPRESSION: 1. Unchanged cardiomegaly and interstitial prominence. 2. Small pleural effusions. 3. Right upper extremity PICC with tip in the SVC. Electronically Signed   By: Keith Rake M.D.   On: 03/11/2020 19:30     Medications:     Scheduled Medications: . amiodarone  400 mg  Oral BID  . aspirin EC  81 mg Oral Daily  . atorvastatin  80 mg Oral q1800  . bisacodyl  10 mg Oral Daily   Or  . bisacodyl  10 mg Rectal Daily  . Chlorhexidine Gluconate Cloth  6 each Topical Daily  . clopidogrel  75 mg Oral Daily  . digoxin  0.125 mg Oral Daily  . docusate sodium  200 mg Oral Daily  . enoxaparin (LOVENOX) injection  40 mg Subcutaneous Q24H  . furosemide  80 mg Intravenous BID  . insulin aspart  0-20 Units Subcutaneous TID AC & HS  . insulin detemir  32 Units Subcutaneous BID  . isosorbide dinitrate  10 mg Oral TID  . pantoprazole  40 mg Oral Daily  . potassium chloride  20 mEq Oral BID  . sacubitril-valsartan  1 tablet Oral BID  . sodium chloride flush  10-40 mL Intracatheter Q12H  . spironolactone  25 mg Oral Daily    Infusions: . sodium chloride Stopped (03/09/20 1512)  . sodium chloride      PRN Medications: sodium chloride, dextrose, metoprolol tartrate, ondansetron (ZOFRAN) IV, oxyCODONE, traMADol    Assessment/Plan   1. CAD: Admitted with NSTEMI.  LHC with 3VD, now s/p CABG x4 with LIMA-LAD, seq radial-D/OM, RIMA-PDA - No chest pain - Continue ASA, Plavix, isordil 10 mg tid.  - On statin.  2. Acute systolic CHF: Ischemic cardiomyopathy.  Echo pre-op with EF 30%, normal RV, Grade IDD. No CVP hooked up this morning but still looks like she has some volume overload. Co-ox 66% on milrinone this morning . Milrinone stopped this am per Dr Orvan Seen. - CVP 3. Volume status improved. Stop IV lasix. Start torsemide 40 mg daily tomorrow. (She was not on diuretics coming in) .  - Renal function stable.  - Continue digoxin 0.125. - Continue  Entresto 24/26 bid.  SBP low this morning continue current dose.  - Continue spironolactone 25 daily.  3. DMII: Hgb A1C 10  - Continue SSI.   4. UTI: Urine culture 03/04/20 showed E Coli  - Completed Ceftriaxone  Course  - Remove foley today.  5. Anemia: expected post-surgical blood loss - Transfuse hgb < 7 at this  point, 9.9.  6. CKD: Stage 3a, 1.26 today.  7. Rhythm stable - unclear why she is on amio (unless for AF avoidance post-op based on PAPA BEAR trial-- amio was stopped 6 days after surgery). Discussed with Dr Orvan Seen. Cut amio to 200 mg twice a day - Should be able to stop amio as an outpatient.   Consult PT.  Remove foley.   Length of Stay: [place], NP  03/12/2020, 7:31 AM  Patient seen with NP, agree with the above note.   Good Co-ox today at 66%, can stop milrinone.  Continue digoxin, check level in am.  CVP 3 and not volume overloaded on exam,  will stop IV lasix and start po torsemide tomorrow.   Continue Entresto and spironolactone, no adjustment with soft BP.    Decrease amiodarone to 200 mg bid.   Hopefully getting close to home.   Loralie Champagne 03/12/2020 1:46 PM

## 2020-03-12 NOTE — Progress Notes (Signed)
Progressing post CABG. CIR considering, but now plans to go home w/ [facility].

## 2020-03-12 NOTE — Progress Notes (Signed)
6 Days Post-Op Procedure(s) (LRB): CORONARY ARTERY BYPASS GRAFTING (CABG) using LIMA to LAD, RIMA to PLB, Left Radial Artery Graft: RAG to Diag1; RAG to OM1. (N/A) RADIAL ARTERY HARVEST (Left) TRANSESOPHAGEAL ECHOCARDIOGRAM (TEE) (N/A) INDOCYANINE GREEN FLUORESCENCE IMAGING (ICG) (N/A) Subjective: No complaints  Objective: Vital signs in last 24 hours: Temp:  [98.1 F (36.7 C)-98.6 F (37 C)] 98.1 F (36.7 C) (04/20 0352) Pulse Rate:  [84-94] 89 (04/20 0506) Cardiac Rhythm: Normal sinus rhythm (04/20 0700) Resp:  [15-26] 16 (04/20 0506) BP: (99-159)/(48-97) 99/51 (04/20 0352) SpO2:  [94 %-100 %] 95 % (04/20 0506) Weight:  [134.3 kg] 134.3 kg (04/20 0526)  Hemodynamic parameters for last 24 hours: CVP:  [5 mmHg-10 mmHg] 5 mmHg  Intake/Output from previous day: 04/19 0701 - 04/20 0700 In: 947.7 [P.O.:325; I.V.:207.7; Blood:315; IV Piggyback:100] Out: 4101 [Urine:4100; Stool:1] Intake/Output this shift: No intake/output data recorded.  General appearance: alert and cooperative Neurologic: intact Heart: regular rate and rhythm, S1, S2 normal, no murmur, click, rub or gallop Lungs: clear to auscultation bilaterally Extremities: edema 1+ Wound: dressed, dry  Lab Results: Recent Labs    03/11/20 0500 03/12/20 0510  WBC 11.5* 13.0*  HGB 8.1* 9.9*  HCT 25.2* 30.5*  PLT 228 295   BMET:  Recent Labs    03/11/20 0500 03/12/20 0510  NA 137 137  K 4.0 3.9  CL 98 97*  CO2 27 31  GLUCOSE 103* 71  BUN 30* 23  CREATININE 1.31* 1.26*  CALCIUM 8.4* 8.5*    PT/INR: No results for input(s): LABPROT, INR in the last 72 hours. ABG    Component Value Date/Time   PHART 7.429 03/08/2020 1715   HCO3 22.1 03/08/2020 1715   TCO2 22 03/07/2020 1242   ACIDBASEDEF 1.6 03/08/2020 1715   O2SAT 65.7 03/12/2020 0510   CBG (last 3)  Recent Labs    03/11/20 2140 03/12/20 0607 03/12/20 0656  GLUCAP 150* 69* 101*    Assessment/Plan: S/P Procedure(s) (LRB): CORONARY ARTERY  BYPASS GRAFTING (CABG) using LIMA to LAD, RIMA to PLB, Left Radial Artery Graft: RAG to Diag1; RAG to OM1. (N/A) RADIAL ARTERY HARVEST (Left) TRANSESOPHAGEAL ECHOCARDIOGRAM (TEE) (N/A) INDOCYANINE GREEN FLUORESCENCE IMAGING (ICG) (N/A) d/c milrinone  PT/OT D/c planning   LOS: 10 days    Wonda Olds 03/12/2020

## 2020-03-12 NOTE — Progress Notes (Signed)
Physical Therapy Treatment Patient Details Name: Rhonda Roberts MRN: [id number] DOB: 1956/08/23 Today's Date: 03/12/2020    History of Present Illness 64 y.o. woman with a history of type 2 DM, HTN, dyslipidemia, morbid obesity, GERD, and arthritis who presented with intermittent chest pain in the setting of nausea, vomiting, and diarrhea for the past week. Came to ED 4/10 and found to have elevated troponin and ischemic ECG changes indicative of NSTEMI. Pt is s/p 4/14 CABGx4 and TEE.     PT Comments    Pt supine on arrival reporting feeling fatigued and wet with noted catheter leaking and linens soaked. Pt assisted for mobility, pericare and linen change. Pt reports niece and other family can assist at home while daughter works and she would feel comfortable with family assist for return home. Pt with improved mobility and activity tolerance and home with HHPT with ramp is a viable option. Pt educated for all sternal precautions and needs min cues to maintain.   HR 90-113 BP 153/81 (96) pre, 135/58 (81) post    Follow Up Recommendations  Home health PT;Supervision for mobility/OOB     Equipment Recommendations  Rolling walker with 5" wheels;3in1 (PT)    Recommendations for Other Services       Precautions / Restrictions Precautions Precautions: Sternal;Fall Precaution Comments: educated for all precautions Restrictions Weight Bearing Restrictions: Yes Other Position/Activity Restrictions: sternal precautions    Mobility  Bed Mobility Overal bed mobility: Needs Assistance Bed Mobility: Rolling;Sidelying to Sit Rolling: Min assist Sidelying to sit: Min assist       General bed mobility comments: cues for sequence with HOB 15 degrees, increased time  Transfers Overall transfer level: Needs assistance   Transfers: Sit to/from Stand;Stand Pivot Transfers Sit to Stand: Min guard Stand pivot transfers: Min guard       General transfer comment: cues for hand  placement on thighs with pt able to stand from bed and BSC without physical assist, pivot bed to bSC with RW  Ambulation/Gait Ambulation/Gait assistance: Min guard Gait Distance (Feet): 100 Feet Assistive device: Rolling walker (2 wheeled) Gait Pattern/deviations: Step-through pattern;Decreased stride length;Trunk flexed;Wide base of support   Gait velocity interpretation: <1.8 ft/sec, indicate of risk for recurrent falls General Gait Details: pt with wide BOS, cues to step into RW and for safety to avoid obstacles   Stairs             Wheelchair Mobility    Modified Rankin (Stroke Patients Only)       Balance Overall balance assessment: Mild deficits observed, not formally tested                                          Cognition Arousal/Alertness: Awake/alert Behavior During Therapy: WFL for tasks assessed/performed Overall Cognitive Status: Within Functional Limits for tasks assessed                                        Exercises      General Comments        Pertinent Vitals/Pain Faces Pain Scale: Hurts little more Pain Location: abdomen Pain Descriptors / Indicators: Pressure Pain Intervention(s): Limited activity within patient's tolerance;Monitored during session;Repositioned    Home Living  Prior Function            PT Goals (current goals can now be found in the care plan section) Progress towards PT goals: Progressing toward goals    Frequency    Min 3X/week      PT Plan Discharge plan needs to be updated;Frequency needs to be updated    Co-evaluation              AM-PAC PT "6 Clicks" Mobility   Outcome Measure  Help needed turning from your back to your side while in a flat bed without using bedrails?: A Little Help needed moving from lying on your back to sitting on the side of a flat bed without using bedrails?: A Little Help needed moving to and from a bed  to a chair (including a wheelchair)?: A Little Help needed standing up from a chair using your arms (e.g., wheelchair or bedside chair)?: A Little Help needed to walk in hospital room?: A Little Help needed climbing 3-5 steps with a railing? : A Little 6 Click Score: 18    End of Session Equipment Utilized During Treatment: Gait belt Activity Tolerance: Patient tolerated treatment well Patient left: in chair;with call bell/phone within reach Nurse Communication: Mobility status PT Visit Diagnosis: Other abnormalities of gait and mobility (R26.89);Muscle weakness (generalized) (M62.81);Difficulty in walking, not elsewhere classified (R26.2)     Time: [id number] PT Time Calculation (min) (ACUTE ONLY): 34 min  Charges:  $Gait Training: 8-22 mins $Therapeutic Activity: 8-22 mins                     Pink Maye P, PT Acute Rehabilitation Services Pager: [phone] Office: [place] 03/12/2020, 12:02 PM

## 2020-03-12 NOTE — Progress Notes (Signed)
Inpatient Rehabilitation Admissions Coordinator  Inpatient rehab consult received. I met with patient at bedside for rehab assessment. We dicussed goals and expectations of any rehab and that therapy is now recommending Home health if she has assistance at home when her daughter works. Patient states her daughter is working with pt's nieces and sister to possibly work out supervision at home when daughter works. Patient will have daughter contact me so that I can review with her my recommendations. I will follow up tomorrow.  Danne Baxter, RN, MSN Rehab Admissions Coordinator [phone] 03/12/2020 12:12 PM

## 2020-03-12 NOTE — Progress Notes (Signed)
CARDIAC REHAB PHASE I   Offered to walk with pt. Pt declining at this time stating she is tired from therapies this morning. Talked about her plan to go home. States her family is making her bathroom more accessible and has a ramp to get in the house. States she has a lot of family support that can step up and help. Plans to talk with her daughter tonight and then rehab again in the morning to finalize. Pt was able to increase distance earlier today. Needs continued motivation and increased energy. Will continue to follow and encourage as able.  [id number] [name], RN BSN 03/12/2020 1:30 PM

## 2020-03-12 NOTE — Plan of Care (Signed)
  Problem: Activity: Goal: Ability to tolerate increased activity will improve Outcome: Progressing   Problem: Education: Goal: Knowledge of General Education information will improve Description: Including pain rating scale, medication(s)/side effects and non-pharmacologic comfort measures Outcome: Progressing   Problem: Clinical Measurements: Goal: Will remain free from infection Outcome: Progressing   Problem: Activity: Goal: Risk for activity intolerance will decrease Outcome: Progressing   Problem: Nutrition: Goal: Adequate nutrition will be maintained Outcome: Progressing   Problem: Coping: Goal: Level of anxiety will decrease Outcome: Progressing   Problem: Elimination: Goal: Will not experience complications related to urinary retention Outcome: Progressing   Problem: Pain Managment: Goal: General experience of comfort will improve Outcome: Progressing   Problem: Clinical Measurements: Goal: Postoperative complications will be avoided or minimized Outcome: Progressing

## 2020-03-12 NOTE — Evaluation (Addendum)
Occupational Therapy Evaluation Patient Details Name: Rhonda Roberts MRN: [id number] DOB: [birth year] Today's Date: 03/12/2020    History of Present Illness 64 y.o. woman with a history of type 2 DM, HTN, dyslipidemia, morbid obesity, GERD, and arthritis who presented with intermittent chest pain in the setting of nausea, vomiting, and diarrhea for the past week. Came to ED 4/10 and found to have elevated troponin and ischemic ECG changes indicative of NSTEMI. Pt is s/p 4/14 CABGx4 and TEE.    Clinical Impression   Pt PTA: pt independent with ADL. Pt currently minguardA overall for mobility to assist with lines and maneuvering around environment with RW. Pt stood x5 mins with supervisionA for grooming tasks. Pt modA overall for BLE ADL. Pt currently limited by decreased strength, decreased activity tolerance and decreased ability to care for self. Pt's cousin/pt's daughter plan to provide 24/7 at home. Education for AE for LB dressing/bathing would be helpful. Mentioned reacher for LB dressing. Pt would benefit from continued OT skilled services for ADL, mobility and energy conservation. OT following acutely.  HR 75-105 BPM    Follow Up Recommendations  Home health OT;Supervision/Assistance - 24 hour(initially)    Equipment Recommendations  3 in 1 bedside commode    Recommendations for Other Services       Precautions / Restrictions Precautions Precautions: Sternal;Fall Precaution Comments: educated for all precautions Restrictions Weight Bearing Restrictions: Yes Other Position/Activity Restrictions: sternal precautions      Mobility Bed Mobility Overal bed mobility: Needs Assistance Bed Mobility: Rolling;Sidelying to Sit Rolling: Min assist Sidelying to sit: Min assist       General bed mobility comments: cues for sequence with HOB 15 degrees, increased time  Transfers Overall transfer level: Needs assistance Equipment used: Rolling walker (2 wheeled) Transfers: Sit  to/from [facility] Sit to Stand: Min guard Stand pivot transfers: Min guard       General transfer comment: cues for hand placement on thighs with pt able to stand from bed and BSC without physical assist, pivot bed to bSC with RW    Balance Overall balance assessment: Mild deficits observed, not formally tested Sitting-balance support: Feet supported;Bilateral upper extremity supported;Single extremity supported;No upper extremity supported Sitting balance-Leahy Scale: Fair     Standing balance support: Single extremity supported;During functional activity Standing balance-Leahy Scale: Poor Standing balance comment: peri care in standing and grooming at sink                           ADL either performed or assessed with clinical judgement   ADL Overall ADL's : Needs assistance/impaired Eating/Feeding: Modified independent;Sitting   Grooming: Supervision/safety;Standing Grooming Details (indicate cue type and reason): stood at sink x5 mins without leaning on counter for grooming tasks Upper Body Bathing: Set up;Sitting;Standing   Lower Body Bathing: Moderate assistance;Cueing for safety;Sitting/lateral leans;Sit to/from stand Lower Body Bathing Details (indicate cue type and reason): To reach BLE/feet requires assist Upper Body Dressing : Set up;Sitting   Lower Body Dressing: Moderate assistance;Cueing for safety;Sitting/lateral leans;Sit to/from stand   Toilet Transfer: Min guard;Ambulation;BSC   Toileting- Water quality scientist and Hygiene: Min guard;Sit to/from stand;Sitting/lateral lean Toileting - Clothing Manipulation Details (indicate cue type and reason): performed own with slightly bent over posture and BLEs in short squat     Functional mobility during ADLs: Supervision/safety;Cueing for safety;Rolling walker General ADL Comments: Pt modA overall for BLE maintanence. Pt currently limited by decreased strength, decreased activity  tolerance and decreased ability  to care for self.     Vision Baseline Vision/History: Wears glasses Wears Glasses: At all times Patient Visual Report: No change from baseline Vision Assessment?: No apparent visual deficits     Perception     Praxis      Pertinent Vitals/Pain Pain Assessment: 0-10 Pain Score: 2  Faces Pain Scale: Hurts little more Pain Location: abdomen Pain Descriptors / Indicators: Pressure Pain Intervention(s): Limited activity within patient's tolerance;Monitored during session;Repositioned     Hand Dominance Right   Extremity/Trunk Assessment Upper Extremity Assessment Upper Extremity Assessment: Generalized weakness LUE Deficits / Details: vein harvest   Lower Extremity Assessment Lower Extremity Assessment: Generalized weakness   Cervical / Trunk Assessment Cervical / Trunk Assessment: Other exceptions Cervical / Trunk Exceptions: large body habitus   Communication Communication Communication: No difficulties   Cognition Arousal/Alertness: Awake/alert Behavior During Therapy: WFL for tasks assessed/performed Overall Cognitive Status: Within Functional Limits for tasks assessed                                     General Comments  VSS on RA. HR 75-105 BPM;     Exercises     Shoulder Instructions      Home Living Family/patient expects to be discharged to:: Private residence Living Arrangements: Children Available Help at Discharge: Family;Available 24 hours/day Type of Home: House Home Access: Ramped entrance     Home Layout: One level     Bathroom Shower/Tub: Teacher, early years/pre: Standard     Home Equipment: Grab bars - tub/shower   Additional Comments: Pt's daughter in room and had discussed that her cousin is working from home and she can stay with pt during the day when pt's daughter is at work to have 24/7 as needed at home.      Prior Functioning/Environment Level of Independence:  Independent        Comments: Pt reports she was helping her brother with dementia in the mornings, ambulates without AD and independent with ADLs- sitting on couch to reach both BLEs to donn sock, daughter assists with some iADLs         OT Problem List: Decreased activity tolerance;Impaired balance (sitting and/or standing);Decreased knowledge of precautions;Pain;Increased edema;Decreased strength      OT Treatment/Interventions: Self-care/ADL training;Therapeutic exercise;Energy conservation;DME and/or AE instruction;Therapeutic activities;Cognitive remediation/compensation;Patient/family education;Balance training    OT Goals(Current goals can be found in the care plan section) Acute Rehab OT Goals Patient Stated Goal: to be more independent OT Goal Formulation: With patient Time For Goal Achievement: 03/26/20 Potential to Achieve Goals: Good ADL Goals Pt Will Perform Lower Body Dressing: with set-up;with adaptive equipment;sitting/lateral leans;sit to/from stand Pt Will Perform Tub/Shower Transfer: with min guard assist;shower seat;ambulating Pt/caregiver will Perform Home Exercise Program: Increased strength;Both right and left upper extremity;With Supervision Additional ADL Goal #1: Pt will verbalize and utilize 2-3 energy conservation techniques to utilize during functional ADL and mobility.  OT Frequency: Min 2X/week   Barriers to D/C:            Co-evaluation              AM-PAC OT "6 Clicks" Daily Activity     Outcome Measure Help from another person eating meals?: None Help from another person taking care of personal grooming?: A Little Help from another person toileting, which includes using toliet, bedpan, or urinal?: A Little Help from another person bathing (including washing,  rinsing, drying)?: A Lot Help from another person to put on and taking off regular upper body clothing?: A Little Help from another person to put on and taking off regular lower body  clothing?: A Lot 6 Click Score: 17   End of Session Equipment Utilized During Treatment: Gait belt;Rolling walker Nurse Communication: Mobility status  Activity Tolerance: Patient tolerated treatment well Patient left: in chair;with call bell/phone within reach;with chair alarm set;with family/visitor present  OT Visit Diagnosis: Unsteadiness on feet (R26.81);Muscle weakness (generalized) (M62.81);Pain Pain - part of body: (chest)                Time: [id number] OT Time Calculation (min): 29 min Charges:  OT General Charges $OT Visit: 1 Visit OT Evaluation $OT Eval Moderate Complexity: 1 Mod OT Treatments $Self Care/Home Management : 8-22 mins  Jefferey Pica, OTR/L Acute Rehabilitation Services Pager: [phone] Office: [phone]   Chanice Brenton C 03/12/2020, 12:56 PM

## 2020-03-12 NOTE — TOC Progression Note (Addendum)
Transition of Care [facility]) - Progression Note    Patient Details  Name: Rhonda Roberts MRN: [id number] Date of Birth: [birth year]  Transition of Care [facility] CM/SW Contact  Zenon Mayo, RN Phone Number: 03/12/2020, 3:43 PM  Clinical Narrative:    NCM spoke with patient, she will need charity HHPT, HHRN, with 3 n 1 and rolling walker at dc.  NCM informed her that [facility] is doing charity for [facility] services this week and she states this is fine.  Referral made to [facility] with [facility] for [facility], HHPT, for charity and referral made to [facility] for DME.  Patient states she will have 24 hr care at home when she is discharged and she will have transportation as well. Will need orders in for HHPT prior to dc.  Per Butch Penny with [facility] they can only take her for HHPT.     Expected Discharge Plan: Power Barriers to Discharge: Continued Medical Work up  Expected Discharge Plan and Services Expected Discharge Plan: [place]   Discharge Planning Services: CM Consult Post Acute Care Choice: [place] arrangements for the past 2 months: Single Family Home                 DME Arranged: 3-N-1, Environmental consultant rolling(charity) DME Agency: [employer] Date DME Agency Contacted: 03/12/20 Time DME Agency Contacted: [phone] Representative spoke with at DME Agency: [place]: RN, PT, Disease Management [place] Agency: [place] ([place]) Date [place]: 03/12/20 Time [place]: 1542 Representative spoke with at [place]: [place] ([place]) Interventions    Readmission Risk Interventions No flowsheet data found.

## 2020-03-13 ENCOUNTER — Inpatient Hospital Stay (HOSPITAL_COMMUNITY): Payer: Self-pay

## 2020-03-13 LAB — GLUCOSE, CAPILLARY
Glucose-Capillary: 96 mg/dL (ref 70–99)
Glucose-Capillary: 132 mg/dL — ABNORMAL HIGH (ref 70–99)
Glucose-Capillary: 104 mg/dL — ABNORMAL HIGH (ref 70–99)
Glucose-Capillary: 155 mg/dL — ABNORMAL HIGH (ref 70–99)

## 2020-03-13 LAB — BASIC METABOLIC PANEL
Anion gap: 11 (ref 5–15)
BUN: 31 mg/dL — ABNORMAL HIGH (ref 8–23)
CO2: 30 mmol/L (ref 22–32)
Calcium: 8.9 mg/dL (ref 8.9–10.3)
Chloride: 96 mmol/L — ABNORMAL LOW (ref 98–111)
Creatinine, Ser: 1.58 mg/dL — ABNORMAL HIGH (ref 0.44–1.00)
GFR calc Af Amer: 40 mL/min — ABNORMAL LOW (ref >60)
GFR calc non Af Amer: 34 mL/min — ABNORMAL LOW (ref >60)
Glucose, Bld: 102 mg/dL — ABNORMAL HIGH (ref 70–99)
Potassium: 4.1 mmol/L (ref 3.5–5.1)
Sodium: 137 mmol/L (ref 135–145)

## 2020-03-13 LAB — DIGOXIN LEVEL: Digoxin Level: 0.2 ng/mL — ABNORMAL LOW (ref 0.8–2.0)

## 2020-03-13 LAB — COOXEMETRY PANEL
Carboxyhemoglobin: 1.4 % (ref 0.5–1.5)
Methemoglobin: 0.8 % (ref 0.0–1.5)
O2 Saturation: 56.4 %
Total hemoglobin: 11 g/dL — ABNORMAL LOW (ref 12.0–16.0)

## 2020-03-13 LAB — CBC
HCT: 32.9 % — ABNORMAL LOW (ref 36.0–46.0)
Hemoglobin: 10.7 g/dL — ABNORMAL LOW (ref 12.0–15.0)
MCH: 28.4 pg (ref 26.0–34.0)
MCHC: 32.5 g/dL (ref 30.0–36.0)
MCV: 87.3 fL (ref 80.0–100.0)
Platelets: 353 10*3/uL (ref 150–400)
RBC: 3.77 MIL/uL — ABNORMAL LOW (ref 3.87–5.11)
RDW: 16.6 % — ABNORMAL HIGH (ref 11.5–15.5)
WBC: 13.7 10*3/uL — ABNORMAL HIGH (ref 4.0–10.5)
nRBC: 0.3 % — ABNORMAL HIGH (ref 0.0–0.2)

## 2020-03-13 NOTE — Progress Notes (Addendum)
Patient ID: Rhonda Roberts, female   DOB: 03-12-1956, 64 y.o.   MRN: [id number]     Advanced Heart Failure Rounding Note  PCP-Cardiologist: Skeet Latch, MD   Subjective:    - S/P CABG x4  4/14 - Extubated 4/15.  - Milrinone stopped 4/20.   Co-ox pending.   Bump in SCr/BUN. SCr 1.26>>1.58. BUN 23>>31.  CVP 3-5 Wt down 1 lb from yesterday.   Pacer wires removed today. NSR on tele.   OOB and sitting in chair. No cardiac complaints.    Objective:   Weight Range: 133.4 kg Body mass index is 53.79 kg/m.   Vital Signs:   Temp:  [97.6 F (36.4 C)-98.7 F (37.1 C)] 98.6 F (37 C) (04/21 0749) Pulse Rate:  [85-93] 90 (04/21 0749) Resp:  [14-24] 20 (04/21 0915) BP: (94-148)/(60-83) 148/70 (04/21 0915) SpO2:  [94 %-98 %] 94 % (04/21 0749) Weight:  [133.4 kg] 133.4 kg (04/21 0314) Last BM Date: 03/12/20  Weight change: Filed Weights   03/11/20 0500 03/12/20 0526 03/13/20 0314  Weight: (!) 137.1 kg 134.3 kg 133.4 kg    Intake/Output:   Intake/Output Summary (Last 24 hours) at 03/13/2020 0949 Last data filed at 03/13/2020 0848 Gross per 24 hour  Intake 710 ml  Output 1300 ml  Net -590 ml      Physical Exam   CVP 3-5 personally checked.  General: obese AAF, sitting up in chair  No resp difficulty HEENT: normal Neck: supple. no JVD. Carotids 2+ bilat; no bruits. No lymphadenopathy or thryomegaly appreciated. Cor: PMI nondisplaced. Regular rate & rhythm. No rubs, gallops or murmurs. Sternotomy site stable Lungs: CTAB, no wheezing  Abdomen: obese, soft, nontender, nondistended. No hepatosplenomegaly. No bruits or masses. Good bowel sounds. Extremities: no cyanosis, clubbing, rash, edema. + RUE PICC, left radial graft harvest site stable    Neuro: alert & orientedx3, cranial nerves grossly intact. moves all 4 extremities w/o difficulty. Affect pleasant    Telemetry   NSR 80s personally checked.   EKG    N/a   Labs    CBC Recent Labs     03/12/20 0510 03/13/20 0327  WBC 13.0* 13.7*  HGB 9.9* 10.7*  HCT 30.5* 32.9*  MCV 86.9 87.3  PLT 295 0000000   Basic Metabolic Panel Recent Labs    03/12/20 0510 03/13/20 0327  NA 137 137  K 3.9 4.1  CL 97* 96*  CO2 31 30  GLUCOSE 71 102*  BUN 23 31*  CREATININE 1.26* 1.58*  CALCIUM 8.5* 8.9   Liver Function Tests No results for input(s): AST, ALT, ALKPHOS, BILITOT, PROT, ALBUMIN in the last 72 hours. No results for input(s): LIPASE, AMYLASE in the last 72 hours. Cardiac Enzymes No results for input(s): CKTOTAL, CKMB, CKMBINDEX, TROPONINI in the last 72 hours.  BNP: BNP (last 3 results) No results for input(s): BNP in the last 8760 hours.  ProBNP (last 3 results) No results for input(s): PROBNP in the last 8760 hours.   D-Dimer No results for input(s): DDIMER in the last 72 hours. Hemoglobin A1C No results for input(s): HGBA1C in the last 72 hours. Fasting Lipid Panel No results for input(s): CHOL, HDL, LDLCALC, TRIG, CHOLHDL, LDLDIRECT in the last 72 hours. Thyroid Function Tests No results for input(s): TSH, T4TOTAL, T3FREE, THYROIDAB in the last 72 hours.  Invalid input(s): FREET3  Other results:   Imaging    No results found.   Medications:     Scheduled Medications: . amiodarone  200 mg  Oral BID  . aspirin EC  81 mg Oral Daily  . atorvastatin  80 mg Oral q1800  . bisacodyl  10 mg Oral Daily   Or  . bisacodyl  10 mg Rectal Daily  . Chlorhexidine Gluconate Cloth  6 each Topical Daily  . clopidogrel  75 mg Oral Daily  . digoxin  0.125 mg Oral Daily  . docusate sodium  200 mg Oral Daily  . enoxaparin (LOVENOX) injection  40 mg Subcutaneous Q24H  . insulin aspart  0-20 Units Subcutaneous TID AC & HS  . insulin detemir  32 Units Subcutaneous BID  . isosorbide dinitrate  10 mg Oral TID  . pantoprazole  40 mg Oral Daily  . potassium chloride  20 mEq Oral BID  . sacubitril-valsartan  1 tablet Oral BID  . sodium chloride flush  10-40 mL  Intracatheter Q12H  . spironolactone  25 mg Oral Daily  . torsemide  40 mg Oral Daily    Infusions: . sodium chloride Stopped (03/09/20 1512)  . sodium chloride      PRN Medications: sodium chloride, dextrose, metoprolol tartrate, ondansetron (ZOFRAN) IV, oxyCODONE, traMADol    Assessment/Plan   1. CAD: Admitted with NSTEMI.  LHC with 3VD, now s/p CABG x4 with LIMA-LAD, seq radial-D/OM, RIMA-PDA - No chest pain - Continue ASA, Plavix, isordil 10 mg tid (30 days post-op, for radial graft).  - On statin.  2. Acute systolic CHF: Ischemic cardiomyopathy.  Echo pre-op with EF 30%, normal RV, Grade IDD. Milrinone discontinued 4/20. Volume status stable CVP 3-5. SCr/BUN up. - check co-ox off milrinone - hold diuretics today w/ AKI and low CVP - reassess in am - continue Entresto 24-26 bid - continue spironolactone 25 qd - continue digoxin 0.125 (dig level ok, 0.2) - Follow BMP 3. DMII: Hgb A1C 10  - Continue SSI.   - Continue statin  4. UTI: Urine culture 03/04/20 showed E Coli  - Completed Ceftriaxone  Course  5. Anemia: expected post-surgical blood loss - improving, hgb trending up, 10.7 today  6. CKD: Stage 3a - Bump in SCr/BUN. SCr 1.25>>1.58 - Hold torsemide for now  - Follow BMP, will continue current doses of Entresto and spiro for now  7. Rhythm stable - unclear why she is on amio (unless for AF avoidance post-op based on PAPA BEAR trial-- amio was stopped 6 days after surgery). Discussed with Dr Orvan Seen. Cut amio to 200 mg twice a day - Should be able to stop amio as an outpatient.  9. HLD - LDL elevated at 159 mg/dL. Goal < 70. Not on statin PTA. Now on atorvastatin 80 qhd - Recheck FLP + HFTs in 6-8 weeks. - Consider future addition of Zetia vs PCSK9i if not at goal  8. Deconditioning - continue to ambulate w/ CR/PT   Length of Stay: [address], PA-C  03/13/2020, 9:49 AM  Patient seen with PA, agree with the above note.    CVP 3, co-ox 56%.   Creatinine up some to 1.58.  She has been walking more today, gradually getting stronger.    Continue current meds except will stop torsemide for now with low CVP and rising creatinine.   Loralie Champagne 03/13/2020 4:44 PM

## 2020-03-13 NOTE — Progress Notes (Signed)
CARDIAC REHAB PHASE I   PRE:  Rate/Rhythm: 83 SR  BP:  Sitting: 122/79      SaO2: 98 RA  MODE:  Ambulation: 100 ft   POST:  Rate/Rhythm: 98 SR  BP:  Sitting: 101/88    SaO2: 100 RA  Pt able to rise from seated position while maintaining sternal precautions. Pt then ambulated 165ft in hallway assist of one with front wheel walker and slow steady gait. Pt denies dizziness, does c/o some SOB and leg weakness. Pt helped to [facility] then back to recliner. Pt demonstrating 750 on IS. Encouraged continued ambulation and IS use. Front wheel walker and BSC at bedside for home use. Will continue to follow.  [id number] [name], RN BSN 03/13/2020 2:08 PM

## 2020-03-13 NOTE — Progress Notes (Signed)
   03/13/20 0805  Vitals  BP 116/60  MAP (mmHg) 78  ECG Heart Rate 89  Resp 19  MEWS Score  MEWS Temp 0  MEWS Systolic 0  MEWS Pulse 0  MEWS RR 0  MEWS LOC 0  MEWS Score 0  MEWS Score Color Green  removed pacer wires

## 2020-03-13 NOTE — Progress Notes (Signed)
Inpatient Rehabilitation Admissions Coordinator  I met with patient at bedside and again recommended home with [facility] with family support. We will sign off at this time.  Danne Baxter, RN, MSN Rehab Admissions Coordinator [phone] 03/13/2020 11:28 AM

## 2020-03-13 NOTE — Plan of Care (Signed)
  Problem: Education: Goal: Understanding of cardiac disease, CV risk reduction, and recovery process will improve Outcome: Progressing Goal: Individualized Educational Video(s) Outcome: Progressing   Problem: Activity: Goal: Ability to tolerate increased activity will improve Outcome: Progressing   Problem: Cardiac: Goal: Ability to achieve and maintain adequate cardiovascular perfusion will improve Outcome: Progressing   Problem: Health Behavior/Discharge Planning: Goal: Ability to safely manage health-related needs after discharge will improve Outcome: Progressing   Problem: Education: Goal: Knowledge of General Education information will improve Description: Including pain rating scale, medication(s)/side effects and non-pharmacologic comfort measures Outcome: Progressing   Problem: Health Behavior/Discharge Planning: Goal: Ability to manage health-related needs will improve Outcome: Progressing   Problem: Clinical Measurements: Goal: Ability to maintain clinical measurements within normal limits will improve Outcome: Progressing Goal: Will remain free from infection Outcome: Progressing Goal: Diagnostic test results will improve Outcome: Progressing Goal: Respiratory complications will improve Outcome: Progressing Goal: Cardiovascular complication will be avoided Outcome: Progressing   Problem: Activity: Goal: Risk for activity intolerance will decrease Outcome: Progressing   Problem: Nutrition: Goal: Adequate nutrition will be maintained Outcome: Progressing   Problem: Coping: Goal: Level of anxiety will decrease Outcome: Progressing   Problem: Elimination: Goal: Will not experience complications related to bowel motility Outcome: Progressing Goal: Will not experience complications related to urinary retention Outcome: Progressing   Problem: Pain Managment: Goal: General experience of comfort will improve Outcome: Progressing   Problem:  Safety: Goal: Ability to remain free from injury will improve Outcome: Progressing   Problem: Skin Integrity: Goal: Risk for impaired skin integrity will decrease Outcome: Progressing   Problem: Education: Goal: Understanding of CV disease, CV risk reduction, and recovery process will improve Outcome: Progressing Goal: Individualized Educational Video(s) Outcome: Progressing   Problem: Activity: Goal: Ability to return to baseline activity level will improve Outcome: Progressing   Problem: Cardiovascular: Goal: Ability to achieve and maintain adequate cardiovascular perfusion will improve Outcome: Progressing Goal: Vascular access site(s) Level 0-1 will be maintained Outcome: Progressing   Problem: Health Behavior/Discharge Planning: Goal: Ability to safely manage health-related needs after discharge will improve Outcome: Progressing   Problem: Education: Goal: Will demonstrate proper wound care and an understanding of methods to prevent future damage Outcome: Progressing Goal: Knowledge of disease or condition will improve Outcome: Progressing Goal: Knowledge of the prescribed therapeutic regimen will improve Outcome: Progressing Goal: Individualized Educational Video(s) Outcome: Progressing   Problem: Activity: Goal: Risk for activity intolerance will decrease Outcome: Progressing   Problem: Cardiac: Goal: Will achieve and/or maintain hemodynamic stability Outcome: Progressing   Problem: Clinical Measurements: Goal: Postoperative complications will be avoided or minimized Outcome: Progressing   Problem: Respiratory: Goal: Respiratory status will improve Outcome: Progressing   Problem: Skin Integrity: Goal: Wound healing without signs and symptoms of infection Outcome: Progressing Goal: Risk for impaired skin integrity will decrease Outcome: Progressing   Problem: Urinary Elimination: Goal: Ability to achieve and maintain adequate renal perfusion and  functioning will improve Outcome: Progressing

## 2020-03-13 NOTE — Progress Notes (Addendum)
      [address]       [place] [address]             [phone]    7 Days Post-Op Procedure(s) (LRB): CORONARY ARTERY BYPASS GRAFTING (CABG) using LIMA to LAD, RIMA to PLB, Left Radial Artery Graft: RAG to Diag1; RAG to OM1. (N/A) RADIAL ARTERY HARVEST (Left) TRANSESOPHAGEAL ECHOCARDIOGRAM (TEE) (N/A) INDOCYANINE GREEN FLUORESCENCE IMAGING (ICG) (N/A)   Subjective:  Patient having some nausea this morning.  Otherwise she is doing okay.  She states she knows her family is making arrangements for discharge, but she is unsure if they will be ready tomorrow for her to come home.  + ambulation   + BM  Objective: Vital signs in last 24 hours: Temp:  [97.6 F (36.4 C)-98.7 F (37.1 C)] 98.4 F (36.9 C) (04/21 0314) Pulse Rate:  [85-93] 86 (04/20 2343) Cardiac Rhythm: Normal sinus rhythm (04/21 0400) Resp:  [14-20] 14 (04/21 0400) BP: (98-128)/(56-83) 98/83 (04/21 0314) SpO2:  [96 %-98 %] 98 % (04/20 1121) Weight:  [133.4 kg] 133.4 kg (04/21 0314)  Hemodynamic parameters for last 24 hours: CVP:  [4 mmHg-7 mmHg] 6 mmHg  Intake/Output from previous day: 04/20 0701 - 04/21 0700 In: 747 [P.O.:710; I.V.:37] Out: 1300 [Urine:1300]  General appearance: alert, cooperative and no distress Heart: regular rate and rhythm Lungs: clear to auscultation bilaterally Abdomen: soft, non-tender; bowel sounds normal; no masses,  no organomegaly Extremities: edema trace Wound: clean and dry, staples in place on sternotomy, left radial site clean and dry, mild ecchymosis  Lab Results: Recent Labs    03/12/20 0510 03/13/20 0327  WBC 13.0* 13.7*  HGB 9.9* 10.7*  HCT 30.5* 32.9*  PLT 295 353   BMET:  Recent Labs    03/12/20 0510 03/13/20 0327  NA 137 137  K 3.9 4.1  CL 97* 96*  CO2 31 30  GLUCOSE 71 102*  BUN 23 31*  CREATININE 1.26* 1.58*  CALCIUM 8.5* 8.9    PT/INR: No results for input(s): LABPROT, INR in the last 72 hours. ABG    Component Value  Date/Time   PHART 7.429 03/08/2020 1715   HCO3 22.1 03/08/2020 1715   TCO2 22 03/07/2020 1242   ACIDBASEDEF 1.6 03/08/2020 1715   O2SAT 65.7 03/12/2020 0510   CBG (last 3)  Recent Labs    03/12/20 1538 03/12/20 2106 03/13/20 0627  GLUCAP 134* 172* 96    Assessment/Plan: S/P Procedure(s) (LRB): CORONARY ARTERY BYPASS GRAFTING (CABG) using LIMA to LAD, RIMA to PLB, Left Radial Artery Graft: RAG to Diag1; RAG to OM1. (N/A) RADIAL ARTERY HARVEST (Left) TRANSESOPHAGEAL ECHOCARDIOGRAM (TEE) (N/A) INDOCYANINE GREEN FLUORESCENCE IMAGING (ICG) (N/A)  1. CV- NSR, BP is labile ranging from 90s-120s- on Amiodarone, Digoxin, Isosordil, Entresto. Off milrinone with CO-ox at 67 2. Pulm- no acute issues, continue IS 3. Renal- slight bump in creatinine today to 1.56,  K is okay.. likely due to diuretic usage and entresto.. will defer to [facility] for adjustments if they feel necessary 4. DM- cbgs controlled 5. Dispo- patient stable, mild bump in creatinine level, repeat BMET in AM, will d/c EPW today, home health orders have been placed as well as DME orders... patient remains clinically stable likely ready for d/c in next 24-48 hours   LOS: 11 days    Ellwood Handler, PA-C  03/13/2020 Agree with assessment and plan.  Tan Clopper Z. Orvan Seen, [place]

## 2020-03-13 NOTE — Plan of Care (Signed)
  Problem: Education: Goal: Understanding of cardiac disease, CV risk reduction, and recovery process will improve Outcome: Progressing   Problem: Activity: Goal: Ability to tolerate increased activity will improve Outcome: Progressing   Problem: Education: Goal: Knowledge of General Education information will improve Description: Including pain rating scale, medication(s)/side effects and non-pharmacologic comfort measures Outcome: Progressing   Problem: Clinical Measurements: Goal: Cardiovascular complication will be avoided Outcome: Progressing   Problem: Activity: Goal: Risk for activity intolerance will decrease Outcome: Progressing   Problem: Nutrition: Goal: Adequate nutrition will be maintained Outcome: Progressing   Problem: Coping: Goal: Level of anxiety will decrease Outcome: Progressing   Problem: Pain Managment: Goal: General experience of comfort will improve Outcome: Progressing   Problem: Safety: Goal: Ability to remain free from injury will improve Outcome: Progressing   Problem: Activity: Goal: Ability to return to baseline activity level will improve Outcome: Progressing   Problem: Education: Goal: Knowledge of disease or condition will improve Outcome: Progressing   Problem: Education: Goal: Knowledge of the prescribed therapeutic regimen will improve Outcome: Progressing   Problem: Clinical Measurements: Goal: Postoperative complications will be avoided or minimized Outcome: Progressing

## 2020-03-13 NOTE — Progress Notes (Signed)
Physical Therapy Treatment Patient Details Name: Rhonda Roberts MRN: [id number] DOB: 28-Apr-1956 Today's Date: 03/13/2020    History of Present Illness Pt is a 64 y.o. woman with a history of type 2 DM, HTN, dyslipidemia, morbid obesity, GERD, and arthritis who presented with intermittent chest pain in the setting of nausea, vomiting, and diarrhea for the past week. Came to ED 4/10 and found to have elevated troponin and ischemic ECG changes indicative of NSTEMI. Pt is s/p 4/14 CABGx4 and TEE.     PT Comments    Pt making steady progress with functional mobility. All VSS throughout on RA. Pt's daughter present throughout as well. All questions answered at end of session. Pt would continue to benefit from skilled physical therapy services at this time while admitted and after d/c to address the below listed limitations in order to improve overall safety and independence with functional mobility.    Follow Up Recommendations  Home health PT;Supervision for mobility/OOB     Equipment Recommendations  Rolling walker with 5" wheels;3in1 (PT)    Recommendations for Other Services       Precautions / Restrictions Precautions Precautions: Sternal;Fall Precaution Comments: pt demonstrating good adherence to precautions Restrictions Other Position/Activity Restrictions: sternal precautions    Mobility  Bed Mobility               General bed mobility comments: pt OOB in recliner chair upon arrival  Transfers Overall transfer level: Needs assistance Equipment used: Rolling walker (2 wheeled) Transfers: Sit to/from Stand Sit to Stand: Min guard         General transfer comment: pt holding pillow with bilateral UEs during transition; min guard for stability  Ambulation/Gait Ambulation/Gait assistance: Min guard Gait Distance (Feet): 100 Feet Assistive device: Rolling walker (2 wheeled) Gait Pattern/deviations: Step-through pattern;Decreased stride length;Trunk flexed;Wide  base of support Gait velocity: slowed   General Gait Details: pt with very slow, cautious and guarded gait; cueing to maintain proximity to [employer] Mobility    Modified Rankin (Stroke Patients Only)       Balance Overall balance assessment: Mild deficits observed, not formally tested Sitting-balance support: Feet supported;Bilateral upper extremity supported;Single extremity supported;No upper extremity supported Sitting balance-Leahy Scale: Fair     Standing balance support: During functional activity;Single extremity supported;Bilateral upper extremity supported Standing balance-Leahy Scale: Poor                              Cognition Arousal/Alertness: Awake/alert Behavior During Therapy: WFL for tasks assessed/performed Overall Cognitive Status: Within Functional Limits for tasks assessed                                        Exercises General Exercises - Lower Extremity Ankle Circles/Pumps: AROM;Both;20 reps;Seated    General Comments        Pertinent Vitals/Pain Pain Assessment: Faces Faces Pain Scale: Hurts little more Pain Location: abdomen Pain Descriptors / Indicators: Pressure Pain Intervention(s): Monitored during session;Repositioned    Home Living                      Prior Function            PT Goals (current goals can now be found in the care plan section)  Acute Rehab PT Goals PT Goal Formulation: With patient Time For Goal Achievement: 03/24/20 Potential to Achieve Goals: Good Progress towards PT goals: Progressing toward goals    Frequency    Min 3X/week      PT Plan Current plan remains appropriate    Co-evaluation              AM-PAC PT "6 Clicks" Mobility   Outcome Measure  Help needed turning from your back to your side while in a flat bed without using bedrails?: A Little Help needed moving from lying on your back to sitting on the side of a  flat bed without using bedrails?: A Little Help needed moving to and from a bed to a chair (including a wheelchair)?: A Little Help needed standing up from a chair using your arms (e.g., wheelchair or bedside chair)?: A Little Help needed to walk in hospital room?: A Little Help needed climbing 3-5 steps with a railing? : A Little 6 Click Score: 18    End of Session   Activity Tolerance: Patient tolerated treatment well Patient left: in chair;with call bell/phone within reach;with family/visitor present Nurse Communication: Mobility status PT Visit Diagnosis: Other abnormalities of gait and mobility (R26.89);Muscle weakness (generalized) (M62.81);Difficulty in walking, not elsewhere classified (R26.2) Pain - part of body: (abdomen)     Time: [id number] PT Time Calculation (min) (ACUTE ONLY): 34 min  Charges:  $Gait Training: 8-22 mins $Therapeutic Activity: 8-22 mins                     Anastasio Champion, DPT  Acute Rehabilitation Services Pager [phone] Office [place] 03/13/2020, 12:34 PM

## 2020-03-14 LAB — CBC
HCT: 29.9 % — ABNORMAL LOW (ref 36.0–46.0)
Hemoglobin: 9.6 g/dL — ABNORMAL LOW (ref 12.0–15.0)
MCH: 28.4 pg (ref 26.0–34.0)
MCHC: 32.1 g/dL (ref 30.0–36.0)
MCV: 88.5 fL (ref 80.0–100.0)
Platelets: 360 10*3/uL (ref 150–400)
RBC: 3.38 MIL/uL — ABNORMAL LOW (ref 3.87–5.11)
RDW: 16.2 % — ABNORMAL HIGH (ref 11.5–15.5)
WBC: 11.8 10*3/uL — ABNORMAL HIGH (ref 4.0–10.5)
nRBC: 0 % (ref 0.0–0.2)

## 2020-03-14 LAB — GLUCOSE, CAPILLARY
Glucose-Capillary: 99 mg/dL (ref 70–99)
Glucose-Capillary: 135 mg/dL — ABNORMAL HIGH (ref 70–99)
Glucose-Capillary: 165 mg/dL — ABNORMAL HIGH (ref 70–99)
Glucose-Capillary: 185 mg/dL — ABNORMAL HIGH (ref 70–99)

## 2020-03-14 LAB — COOXEMETRY PANEL
Carboxyhemoglobin: 1.9 % — ABNORMAL HIGH (ref 0.5–1.5)
Methemoglobin: 1.4 % (ref 0.0–1.5)
O2 Saturation: 60.5 %
Total hemoglobin: 10.3 g/dL — ABNORMAL LOW (ref 12.0–16.0)

## 2020-03-14 LAB — BASIC METABOLIC PANEL WITH GFR
Anion gap: 11 (ref 5–15)
BUN: 35 mg/dL — ABNORMAL HIGH (ref 8–23)
CO2: 30 mmol/L (ref 22–32)
Calcium: 8.7 mg/dL — ABNORMAL LOW (ref 8.9–10.3)
Chloride: 93 mmol/L — ABNORMAL LOW (ref 98–111)
Creatinine, Ser: 1.52 mg/dL — ABNORMAL HIGH (ref 0.44–1.00)
GFR calc Af Amer: 42 mL/min — ABNORMAL LOW
GFR calc non Af Amer: 36 mL/min — ABNORMAL LOW
Glucose, Bld: 117 mg/dL — ABNORMAL HIGH (ref 70–99)
Potassium: 4.1 mmol/L (ref 3.5–5.1)
Sodium: 134 mmol/L — ABNORMAL LOW (ref 135–145)

## 2020-03-14 MED FILL — Calcium Chloride Inj 10%: INTRAVENOUS | Qty: 10 | Status: AC

## 2020-03-14 MED FILL — Electrolyte-R (PH 7.4) Solution: INTRAVENOUS | Qty: 4000 | Status: AC

## 2020-03-14 MED FILL — Heparin Sodium (Porcine) Inj 1000 Unit/ML: INTRAMUSCULAR | Qty: 10 | Status: AC

## 2020-03-14 MED FILL — Sodium Bicarbonate IV Soln 8.4%: INTRAVENOUS | Qty: 50 | Status: AC

## 2020-03-14 MED FILL — Sodium Chloride IV Soln 0.9%: INTRAVENOUS | Qty: 3000 | Status: AC

## 2020-03-14 MED FILL — Mannitol IV Soln 20%: INTRAVENOUS | Qty: 500 | Status: AC

## 2020-03-14 NOTE — Progress Notes (Signed)
      [address]       [place] [address]             [phone]      8 Days Post-Op Procedure(s) (LRB): CORONARY ARTERY BYPASS GRAFTING (CABG) using LIMA to LAD, RIMA to PLB, Left Radial Artery Graft: RAG to Diag1; RAG to OM1. (N/A) RADIAL ARTERY HARVEST (Left) TRANSESOPHAGEAL ECHOCARDIOGRAM (TEE) (N/A) INDOCYANINE GREEN FLUORESCENCE IMAGING (ICG) (N/A)   Subjective:  Patient has no new complaints.  She has spoken with her daughter who will help provide care for the patient.  They aren't ready for her to be discharged today.    Objective: Vital signs in last 24 hours: Temp:  [97.8 F (36.6 C)-98.6 F (37 C)] 98.6 F (37 C) (04/22 0310) Pulse Rate:  [80-90] 84 (04/22 0310) Cardiac Rhythm: Normal sinus rhythm (04/22 0700) Resp:  [14-24] 19 (04/22 0310) BP: (94-148)/(55-88) 114/67 (04/22 0310) SpO2:  [94 %-100 %] 100 % (04/22 0310) Weight:  [133.3 kg] 133.3 kg (04/22 0310)  Hemodynamic parameters for last 24 hours: CVP:  [5 mmHg-7 mmHg] 6 mmHg  Intake/Output from previous day: 04/21 0701 - 04/22 0700 In: 960 [P.O.:960] Out: 1050 [Urine:1050]  General appearance: alert, cooperative and no distress Heart: regular rate and rhythm Lungs: clear to auscultation bilaterally Abdomen: soft, non-tender; bowel sounds normal; no masses,  no organomegaly Extremities: edema trace Wound: clean and dry  Lab Results: Recent Labs    03/13/20 0327 03/14/20 0412  WBC 13.7* 11.8*  HGB 10.7* 9.6*  HCT 32.9* 29.9*  PLT 353 360   BMET:  Recent Labs    03/13/20 0327 03/14/20 0412  NA 137 134*  K 4.1 4.1  CL 96* 93*  CO2 30 30  GLUCOSE 102* 117*  BUN 31* 35*  CREATININE 1.58* 1.52*  CALCIUM 8.9 8.7*    PT/INR: No results for input(s): LABPROT, INR in the last 72 hours. ABG    Component Value Date/Time   PHART 7.429 03/08/2020 1715   HCO3 22.1 03/08/2020 1715   TCO2 22 03/07/2020 1242   ACIDBASEDEF 1.6 03/08/2020 1715   O2SAT 60.5 03/14/2020 0412     CBG (last 3)  Recent Labs    03/13/20 1634 03/13/20 2119 03/14/20 0620  GLUCAP 104* 155* 99    Assessment/Plan: S/P Procedure(s) (LRB): CORONARY ARTERY BYPASS GRAFTING (CABG) using LIMA to LAD, RIMA to PLB, Left Radial Artery Graft: RAG to Diag1; RAG to OM1. (N/A) RADIAL ARTERY HARVEST (Left) TRANSESOPHAGEAL ECHOCARDIOGRAM (TEE) (N/A) INDOCYANINE GREEN FLUORESCENCE IMAGING (ICG) (N/A)  1. CV- NSR, BP improved- on Amiodarone, Isosordil, Digoxin, Entresto 2. Pulm- no acute issues, continue IS 3. Renal- creatinine stable at 1.52, remains on Spironolactone, Demadex discontinued 4. DM- cbgs remain controlled 5. Dispo- patient stable, creatinine remains stable, will plan to d/c in AM if okay with [facility] team, [facility] home PT has been arranged, DME supplies have been delivered to the room.. patient may require medication assistance   LOS: 12 days    Ellwood Handler, PA-C  03/14/2020

## 2020-03-14 NOTE — Progress Notes (Signed)
Physical Therapy Treatment Patient Details Name: Rhonda Roberts MRN: [id number] DOB: 10-19-1956 Today's Date: 03/14/2020    History of Present Illness Pt is a 64 y.o. woman with a history of type 2 DM, HTN, dyslipidemia, morbid obesity, GERD, and arthritis who presented with intermittent chest pain in the setting of nausea, vomiting, and diarrhea for the past week. Came to ED 4/10 and found to have elevated troponin and ischemic ECG changes indicative of NSTEMI. Pt is s/p 4/14 CABGx4 and TEE.     PT Comments    Pt sitting EOB on arrival with maintained 100' of gait. Pt educated for need to progress gait and increase activity. Pt maintaining precautions and educated for all as well as bil LE HEP. Pt reports family is arranging supervision for return home. Will continue to follow.   HR 89-99 96% on RA BP 133/72 (91)    Follow Up Recommendations  Home health PT;Supervision for mobility/OOB     Equipment Recommendations  Rolling walker with 5" wheels;3in1 (PT)    Recommendations for Other Services       Precautions / Restrictions Precautions Precautions: Sternal;Fall Precaution Booklet Issued: Yes (comment) Precaution Comments: pt adhering to precautions but unable to recall. Educated for all with handout provided    Mobility  Bed Mobility               General bed mobility comments: pt sitting EOB on arrival  Transfers Overall transfer level: Needs assistance   Transfers: Sit to/from Stand Sit to Stand: Min guard         General transfer comment: good hand placement on thighs able to stand from bed and to chair with cues and increased time to scoot back on surface  Ambulation/Gait Ambulation/Gait assistance: Min guard Gait Distance (Feet): 100 Feet Assistive device: Rolling walker (2 wheeled) Gait Pattern/deviations: Step-through pattern;Decreased stride length;Trunk flexed;Wide base of support   Gait velocity interpretation: <1.8 ft/sec, indicate of risk  for recurrent falls General Gait Details: pt with very slow, cautious and guarded gait; cueing to maintain proximity to RW and extend trunk. Pt educated for need to increase distance and gait trials   Stairs             Wheelchair Mobility    Modified Rankin (Stroke Patients Only)       Balance Overall balance assessment: Mild deficits observed, not formally tested                                          Cognition Arousal/Alertness: Awake/alert Behavior During Therapy: WFL for tasks assessed/performed Overall Cognitive Status: Impaired/Different from baseline Area of Impairment: Memory                     Memory: Decreased recall of precautions                Exercises General Exercises - Lower Extremity Long Arc Quad: AROM;Both;Seated;15 reps Hip ABduction/ADduction: AROM;Both;Seated;15 reps Hip Flexion/Marching: AROM;Both;Seated;15 reps Toe Raises: AROM;Seated;Both;15 reps Heel Raises: AROM;Both;Seated;15 reps    General Comments        Pertinent Vitals/Pain Pain Score: 5  Pain Location: chest Pain Descriptors / Indicators: Aching;Sore Pain Intervention(s): Limited activity within patient's tolerance;Repositioned;Monitored during session    Home Living                      Prior Function  PT Goals (current goals can now be found in the care plan section) Progress towards PT goals: Progressing toward goals    Frequency    Min 3X/week      PT Plan Current plan remains appropriate    Co-evaluation              AM-PAC PT "6 Clicks" Mobility   Outcome Measure  Help needed turning from your back to your side while in a flat bed without using bedrails?: A Little Help needed moving from lying on your back to sitting on the side of a flat bed without using bedrails?: A Little Help needed moving to and from a bed to a chair (including a wheelchair)?: A Little Help needed standing up from a  chair using your arms (e.g., wheelchair or bedside chair)?: A Little Help needed to walk in hospital room?: A Little Help needed climbing 3-5 steps with a railing? : A Little 6 Click Score: 18    End of Session Equipment Utilized During Treatment: Gait belt Activity Tolerance: Patient tolerated treatment well Patient left: in chair;with call bell/phone within reach Nurse Communication: Mobility status PT Visit Diagnosis: Other abnormalities of gait and mobility (R26.89);Muscle weakness (generalized) (M62.81);Difficulty in walking, not elsewhere classified (R26.2)     Time: [id number] PT Time Calculation (min) (ACUTE ONLY): 23 min  Charges:  $Gait Training: 8-22 mins $Therapeutic Exercise: 8-22 mins                     Wladyslaw Henrichs P, PT Acute Rehabilitation Services Pager: [phone] Office: [place] 03/14/2020, 11:32 AM

## 2020-03-14 NOTE — Progress Notes (Signed)
Patient requires heavy duty bariatric 3 n 1 because she his heavy on the bottom and weight is not distributed evenly on normal 3 n 1.

## 2020-03-14 NOTE — TOC Progression Note (Addendum)
Transition of Care [facility]) - Progression Note    Patient Details  Name: Rhonda Roberts MRN: [id number] Date of Birth: 02-08-1956  [facility] of [facility]) CM/SW Contact  Zenon Mayo, RN Phone Number: 03/14/2020, 3:44 PM  Clinical Narrative:    NCM spoke with Meagan with HF pharmacy she states they are assisting patient with entresto patient ast application, she will get the 30 day free coupon also.  NCM also notified Melissa with [employer] to bring the bariatric 3 n 1 to room and take the other  3 n1 back it is too small.  Lenna Sciara states she will transfer it out.    Expected Discharge Plan: [place] Barriers to Discharge: Continued Medical Work up  Expected Discharge Plan and Services Expected Discharge Plan: [place]   Discharge Planning Services: CM Consult Post Acute Care Choice: [place] arrangements for the past 2 months: Single Family Home                 DME Arranged: 3-N-1, Environmental consultant rolling(charity) DME Agency: [facility] Date DME Agency Contacted: 03/12/20 Time DME Agency Contacted: [phone] Representative spoke with at DME Agency: [place]: RN, PT, Disease Management [place] Agency: [place] ([place]) Date [place]: 03/12/20 Time [place]: 1542 Representative spoke with at [place]: [place] ([place]) Interventions    Readmission Risk Interventions No flowsheet data found.

## 2020-03-14 NOTE — Progress Notes (Addendum)
Occupational Therapy Treatment Patient Details Name: Rhonda Roberts MRN: [id number] DOB: 05-10-1956 Today's Date: 03/14/2020    History of present illness Pt is a 64 y.o. woman with a history of type 2 DM, HTN, dyslipidemia, morbid obesity, GERD, and arthritis who presented with intermittent chest pain in the setting of nausea, vomiting, and diarrhea for the past week. Came to ED 4/10 and found to have elevated troponin and ischemic ECG changes indicative of NSTEMI. Pt is s/p 4/14 CABGx4 and TEE.    OT comments  Pt seen for OT follow up with focus on implementation of sternal precautions with BADL. Pt continues to need min cues to recall precautions. Daughter present and supportive throughout session. Pt completed functional mobility a household distance with RW at supervision level. She reported feeling nauseous and could not progress the distance she had originally desired. Educated pt on daughter on tub bench for home and how safely use and acquire, as well as hand held shower head. Continue to recommend HHOT to help reinforce sternal precautions with BADL at support independence. Will continue to follow.   Follow Up Recommendations  Home health OT;Supervision/Assistance - 24 hour    Equipment Recommendations  3 in 1 bedside commode    Recommendations for Other Services      Precautions / Restrictions Precautions Precautions: Sternal;Fall Precaution Booklet Issued: Yes (comment) Precaution Comments: pt adhering to precautions but unable to recall without cues       Mobility Bed Mobility               General bed mobility comments: up in chair  Transfers Overall transfer level: Needs assistance Equipment used: Rolling walker (2 wheeled) Transfers: Sit to/from Stand Sit to Stand: Min guard         General transfer comment: pt used heart pillow with trunk momentum to stand    Balance Overall balance assessment: Mild deficits observed, not formally tested                                          ADL either performed or assessed with clinical judgement   ADL Overall ADL's : Needs assistance/impaired                         Toilet Transfer: Min guard;Ambulation;BSC   Toileting- Water quality scientist and Hygiene: Min guard;Sit to/from stand;Sitting/lateral lean Toileting - Clothing Manipulation Details (indicate cue type and reason): cues to maintain sternal precautions     Functional mobility during ADLs: Supervision/safety;Cueing for safety;Rolling walker       Vision Baseline Vision/History: Wears glasses Wears Glasses: At all times Patient Visual Report: No change from baseline     Perception     Praxis      Cognition Arousal/Alertness: Awake/alert Behavior During Therapy: WFL for tasks assessed/performed Overall Cognitive Status: Impaired/Different from baseline Area of Impairment: Memory                     Memory: Decreased recall of precautions                  Exercises General Exercises - Lower Extremity Long Arc Quad: AROM;Both;Seated;15 reps Hip ABduction/ADduction: AROM;Both;Seated;15 reps Hip Flexion/Marching: AROM;Both;Seated;15 reps Toe Raises: AROM;Seated;Both;15 reps Heel Raises: AROM;Both;Seated;15 reps   Shoulder Instructions       General Comments      Pertinent Vitals/  Pain       Pain Assessment: Faces Pain Score: 5  Faces Pain Scale: Hurts little more Pain Location: chest Pain Descriptors / Indicators: Aching;Sore Pain Intervention(s): Limited activity within patient's tolerance;Monitored during session;Repositioned  Home Living                                          Prior Functioning/Environment              Frequency  Min 2X/week        Progress Toward Goals  OT Goals(current goals can now be found in the care plan section)  Progress towards OT goals: Progressing toward goals  Acute Rehab OT Goals Patient Stated  Goal: to be more independent OT Goal Formulation: With patient Time For Goal Achievement: 03/26/20 Potential to Achieve Goals: Good  Plan Discharge plan remains appropriate    Co-evaluation                 AM-PAC OT "6 Clicks" Daily Activity     Outcome Measure   Help from another person eating meals?: None Help from another person taking care of personal grooming?: A Little Help from another person toileting, which includes using toliet, bedpan, or urinal?: A Little Help from another person bathing (including washing, rinsing, drying)?: A Lot Help from another person to put on and taking off regular upper body clothing?: A Little Help from another person to put on and taking off regular lower body clothing?: A Lot 6 Click Score: 17    End of Session Equipment Utilized During Treatment: Rolling walker  OT Visit Diagnosis: Unsteadiness on feet (R26.81);Muscle weakness (generalized) (M62.81);Pain Pain - part of body: (chest incision)   Activity Tolerance Patient tolerated treatment well   Patient Left in chair;with call bell/phone within reach;with family/visitor present   Nurse Communication Mobility status        Time: [id number] OT Time Calculation (min): 28 min  Charges: OT General Charges $OT Visit: 1 Visit OT Treatments $Self Care/Home Management : 23-37 mins  Zenovia Jarred, MSOT, OTR/L [facility] Office Number: [phone] Pager: [phone]  Zenovia Jarred 03/14/2020, 1:46 PM

## 2020-03-14 NOTE — Progress Notes (Signed)
CARDIAC REHAB PHASE I   Offered to walk with pt. Pt states she has walked twice today and feels like she is continuing to make progress. D/c education completed with pt. Pt educated on importance of site care and monitoring incisions daily. Encouraged continued IS use, walks, and sternal precautions. Pt given in-the-tube sheet along with heart healthy and diabetic diets. Talked about daily weights, pt states her sister is getting a new scale for her to use. Pt states she has coverage at home between her daughter, sister, and nieces. Hesitant and nervous to d/c but has good support. Walker and BSC at bedside, but unsure if she will fit on the [facility]. RN made aware. Will refer to CRP II [place].   [id number] [name], RN BSN 03/14/2020 2:07 PM

## 2020-03-14 NOTE — Progress Notes (Signed)
CARDIAC REHAB PHASE I   Pt seen ambulating in hallway with OT. C/o nausea limiting distance. Will f/u later and encourage ambulation.  [name], RN BSN 03/14/2020 10:10AM

## 2020-03-14 NOTE — Progress Notes (Signed)
Patient ID: Rhonda Roberts, female   DOB: [birth year], 64 y.o.   MRN: [id number]     Advanced Heart Failure Rounding Note  PCP-Cardiologist: Skeet Latch, MD   Subjective:    - S/P CABG x4  4/14 - Extubated 4/15.  - Milrinone stopped 4/20.   Co-ox 61%, CVP about 6.  Creatinine stable 1.5.  OOB and sitting in chair. No dyspnea or chest pain.  Gets tired with walks.    Objective:   Weight Range: 133.3 kg Body mass index is 53.74 kg/m.   Vital Signs:   Temp:  [97.8 F (36.6 C)-98.6 F (37 C)] 98 F (36.7 C) (04/22 0749) Pulse Rate:  [80-90] 84 (04/22 0310) Resp:  [15-24] 21 (04/22 0821) BP: (105-148)/(55-88) 133/72 (04/22 0821) SpO2:  [95 %-100 %] 98 % (04/22 0821) Weight:  [133.3 kg] 133.3 kg (04/22 0310) Last BM Date: 03/12/20  Weight change: Filed Weights   03/12/20 0526 03/13/20 0314 03/14/20 0310  Weight: 134.3 kg 133.4 kg 133.3 kg    Intake/Output:   Intake/Output Summary (Last 24 hours) at 03/14/2020 0857 Last data filed at 03/14/2020 0829 Gross per 24 hour  Intake 960 ml  Output 1075 ml  Net -115 ml      Physical Exam   General: NAD Neck: No JVD, no thyromegaly or thyroid nodule.  Lungs: Clear to auscultation bilaterally with normal respiratory effort. CV: Nondisplaced PMI.  Heart regular S1/S2, no S3/S4, no murmur.  Trace ankle edema.   Abdomen: Soft, nontender, no hepatosplenomegaly, no distention.  Skin: Intact without lesions or rashes.  Neurologic: Alert and oriented x 3.  Psych: Normal affect. Extremities: No clubbing or cyanosis.  HEENT: Normal.    Telemetry   NSR 80s personally checked.   EKG    N/a   Labs    CBC Recent Labs    03/13/20 0327 03/14/20 0412  WBC 13.7* 11.8*  HGB 10.7* 9.6*  HCT 32.9* 29.9*  MCV 87.3 88.5  PLT 353 [id number]   Basic Metabolic Panel Recent Labs    03/13/20 0327 03/14/20 0412  NA 137 134*  K 4.1 4.1  CL 96* 93*  CO2 30 30  GLUCOSE 102* 117*  BUN 31* 35*  CREATININE 1.58* 1.52*    CALCIUM 8.9 8.7*   Liver Function Tests No results for input(s): AST, ALT, ALKPHOS, BILITOT, PROT, ALBUMIN in the last 72 hours. No results for input(s): LIPASE, AMYLASE in the last 72 hours. Cardiac Enzymes No results for input(s): CKTOTAL, CKMB, CKMBINDEX, TROPONINI in the last 72 hours.  BNP: BNP (last 3 results) No results for input(s): BNP in the last 8760 hours.  ProBNP (last 3 results) No results for input(s): PROBNP in the last 8760 hours.   D-Dimer No results for input(s): DDIMER in the last 72 hours. Hemoglobin A1C No results for input(s): HGBA1C in the last 72 hours. Fasting Lipid Panel No results for input(s): CHOL, HDL, LDLCALC, TRIG, CHOLHDL, LDLDIRECT in the last 72 hours. Thyroid Function Tests No results for input(s): TSH, T4TOTAL, T3FREE, THYROIDAB in the last 72 hours.  Invalid input(s): FREET3  Other results:   Imaging    DG Chest 2 View  Result Date: 03/13/2020 CLINICAL DATA:  Open heart surgery EXAM: CHEST - 2 VIEW COMPARISON:  03/11/2020 FINDINGS: No significant interval change in AP portable chest radiograph status post median sternotomy with cardiomegaly. Mild, diffuse interstitial opacity, likely edema. Right upper extremity PICC. IMPRESSION: No significant interval change in AP portable chest radiograph status post median  sternotomy. Cardiomegaly with mild, diffuse interstitial opacity, likely edema. No new airspace opacity. Electronically Signed   By: Eddie Candle M.D.   On: 03/13/2020 14:41     Medications:     Scheduled Medications: . amiodarone  200 mg Oral BID  . aspirin EC  81 mg Oral Daily  . atorvastatin  80 mg Oral q1800  . bisacodyl  10 mg Oral Daily   Or  . bisacodyl  10 mg Rectal Daily  . Chlorhexidine Gluconate Cloth  6 each Topical Daily  . clopidogrel  75 mg Oral Daily  . digoxin  0.125 mg Oral Daily  . docusate sodium  200 mg Oral Daily  . enoxaparin (LOVENOX) injection  40 mg Subcutaneous Q24H  . insulin aspart  0-20  Units Subcutaneous TID AC & HS  . insulin detemir  32 Units Subcutaneous BID  . isosorbide dinitrate  10 mg Oral TID  . pantoprazole  40 mg Oral Daily  . potassium chloride  20 mEq Oral BID  . sacubitril-valsartan  1 tablet Oral BID  . sodium chloride flush  10-40 mL Intracatheter Q12H  . spironolactone  25 mg Oral Daily    Infusions: . sodium chloride Stopped (03/09/20 1512)  . sodium chloride      PRN Medications: sodium chloride, dextrose, metoprolol tartrate, ondansetron (ZOFRAN) IV, oxyCODONE, traMADol    Assessment/Plan   1. CAD: Admitted with NSTEMI.  LHC with 3VD, now s/p CABG x4 with LIMA-LAD, seq radial-D/OM, RIMA-PDA.  No chest pain. - Continue ASA, Plavix, isordil 10 mg tid (30 days post-op, for radial graft).  - On statin.  2. Acute systolic CHF: Ischemic cardiomyopathy.  Echo pre-op with EF 30%, normal RV, Grade IDD. Milrinone discontinued 4/20. Volume status stable CVP 6.  Creatinine 1.5.  Co-ox 61%. Difficulty obtaining BP with readings fluctuating a lot but probably ok (most recent 131/65).  - No diuretic today with low CVP.  Can probably start low dose Lasix tomorrow.  - continue Entresto 24-26 bid - continue spironolactone 25 qd - continue digoxin 0.125 - Hopefully add Coreg tomorrow am.  3. DMII: Hgb A1C 10  - Continue SSI.   - Continue statin  4. UTI: Urine culture 03/04/20 showed E Coli  - Completed Ceftriaxone course  5. Anemia: expected post-surgical blood loss - stable.  6. CKD: Stage.  Creatinine stable 1.52. - No diuretic today, possibly low dose Lasix tomorrow.  7. Rhythm: Continue amiodarone 200 mg twice a day - Should be able to stop amio as an outpatient.  9. HLD: Atorvastatin 80 daily.  - Consider future addition of Zetia vs PCSK9i if LDL not at goal when checked.  8. Deconditioning - continue to ambulate w/ CR/PT   Length of Stay: 12  Loralie Champagne, MD  03/14/2020, 8:57 AM  Patient seen with PA, agree with the above note.

## 2020-03-15 LAB — GLUCOSE, CAPILLARY
Glucose-Capillary: 99 mg/dL (ref 70–99)
Glucose-Capillary: 99 mg/dL (ref 70–99)
Glucose-Capillary: 129 mg/dL — ABNORMAL HIGH (ref 70–99)

## 2020-03-15 MED ORDER — CLOPIDOGREL BISULFATE 75 MG PO TABS: 75.0000 mg | ORAL_TABLET | Freq: Every day | ORAL | 3 refills | Status: DC

## 2020-03-15 MED ORDER — CARVEDILOL 3.125 MG PO TABS: 3.1250 mg | ORAL_TABLET | Freq: Two times a day (BID) | ORAL | 2 refills | Status: DC

## 2020-03-15 MED ORDER — FUROSEMIDE 80 MG PO TABS: 80.0000 mg | ORAL_TABLET | Freq: Every day | ORAL | 2 refills | Status: DC

## 2020-03-15 MED ORDER — ISOSORBIDE DINITRATE 10 MG PO TABS: 10.0000 mg | ORAL_TABLET | Freq: Three times a day (TID) | ORAL | 0 refills | Status: DC

## 2020-03-15 MED ORDER — DIGOXIN 125 MCG PO TABS: 0.1250 mg | ORAL_TABLET | Freq: Every day | ORAL | 3 refills | Status: DC

## 2020-03-15 MED ORDER — SACUBITRIL-VALSARTAN 24-26 MG PO TABS: 1.0000 | ORAL_TABLET | Freq: Two times a day (BID) | ORAL | 3 refills | Status: DC

## 2020-03-15 MED ORDER — OXYCODONE HCL 5 MG PO TABS: 5.0000 mg | ORAL_TABLET | ORAL | 0 refills | Status: DC | PRN

## 2020-03-15 MED ORDER — AMIODARONE HCL 200 MG PO TABS: 200.0000 mg | ORAL_TABLET | Freq: Two times a day (BID) | ORAL | 1 refills | Status: DC

## 2020-03-15 MED ORDER — HYDRALAZINE HCL 25 MG PO TABS: 12.5000 mg | ORAL_TABLET | Freq: Three times a day (TID) | ORAL | Status: DC

## 2020-03-15 MED ORDER — FUROSEMIDE 80 MG PO TABS
80.0000 mg | ORAL_TABLET | Freq: Every day | ORAL | Status: DC
  Administered 2020-03-15: 80 mg via ORAL
  Filled 2020-03-15: qty 1

## 2020-03-15 MED ORDER — ATORVASTATIN CALCIUM 80 MG PO TABS: 80.0000 mg | ORAL_TABLET | Freq: Every day | ORAL | 3 refills | Status: DC

## 2020-03-15 MED ORDER — CARVEDILOL 3.125 MG PO TABS: 3.1250 mg | ORAL_TABLET | Freq: Two times a day (BID) | ORAL | Status: DC

## 2020-03-15 MED ORDER — SPIRONOLACTONE 25 MG PO TABS: 25.0000 mg | ORAL_TABLET | Freq: Every day | ORAL | 3 refills | Status: DC

## 2020-03-15 MED FILL — CLOPIDOGREL 75 MG TABLET: 75 | 30 days supply | Qty: 30 | Fill #0

## 2020-03-15 MED FILL — ISOSORBIDE DN 10 MG TABLET: 10 | 30 days supply | Qty: 90 | Fill #0

## 2020-03-15 MED FILL — oxyCODONE HCL 5 MG TABS: 5 | 4 days supply | Qty: 42 | Fill #0

## 2020-03-15 MED FILL — ENTRESTO 24 MG-26 MG TABLET: 24-26 | 30 days supply | Qty: 60 | Fill #0

## 2020-03-15 MED FILL — SPIRONOLACTONE 25 MG TABLET: 25 | 30 days supply | Qty: 30 | Fill #0

## 2020-03-15 MED FILL — CARVEDILOL 3.125 MG TABLET: 3.125 | 30 days supply | Qty: 60 | Fill #0

## 2020-03-15 MED FILL — ATORVASTATIN CALCIUM 80 MG: 80 | 30 days supply | Qty: 30 | Fill #0

## 2020-03-15 MED FILL — DIGOXIN 0.125 MG TABLET: 125 | 30 days supply | Qty: 30 | Fill #0

## 2020-03-15 MED FILL — AMIODARONE HCL 200 MG TABS: 200 | 30 days supply | Qty: 60 | Fill #0

## 2020-03-15 MED FILL — FUROSEMIDE 80 MG TAB: 80 | 30 days supply | Qty: 30 | Fill #0

## 2020-03-15 NOTE — TOC Transition Note (Addendum)
Transition of Care [facility]) - CM/SW Discharge Note   Patient Details  Name: Rhonda Roberts MRN: [id number] Date of Birth: 05/26/1956  Transition of Care [facility]) CM/SW Contact:  Zenon Mayo, RN Phone Number: 03/15/2020, 9:12 AM   Clinical Narrative:    Patient is for dc today, she will have HHPT with [facility] for charity and she has the DME in her room.  NCM assisted with medciations thru [employer].  Toc is filling the meds.  Meagan with HF pharmacy states they are assisting patient with entresto patient ast application, she will get the 30 day free coupon also.   Final next level of care: Home w Home Health Services Barriers to Discharge: No Barriers Identified   Patient Goals and CMS Choice Patient states their goals for this hospitalization and ongoing recovery are:: get fluid off      Discharge Placement                       Discharge Plan and Services   Discharge Planning Services: CM Consult Post Acute Care Choice: Home Health          DME Arranged: 3-N-1, Walker rolling(charity) DME Agency: [facility] Date DME Agency Contacted: 03/12/20 Time DME Agency Contacted: [phone] Representative spoke with at DME Agency: [place]: PT [place]: [place] ([place]) Date Elim: 03/12/20 Time [place]: [id number] Representative spoke with at [place]: [place] ([place]) Interventions     Readmission Risk Interventions No flowsheet data found.

## 2020-03-15 NOTE — Progress Notes (Signed)
CARDIAC REHAB PHASE I   Pt d/cing today. States some nerves, but understands what she needs to do to continue to progress. Reinforced importance IS use, walks, sternal precautions, and monitoring incisions daily. Pt continues to state good family support. Pt referred to CRP II [place] [name], RN BSN 03/15/2020 8:37 AM

## 2020-03-15 NOTE — Discharge Summary (Signed)
Addendum to previous discharge summary.  Medications were adjusted by Advanced heart failure team.  Updated med list is posted below.   Allergies as of 03/15/2020      Reactions   Sulfa Antibiotics Itching, Swelling, Rash      Medication List    TAKE these medications   amiodarone 200 MG tablet Commonly known as: PACERONE Take 1 tablet (200 mg total) by mouth 2 (two) times daily. For 7 days, then decrease to 200 mg daily   aspirin 81 MG chewable tablet Chew 81 mg by mouth daily.   atorvastatin 80 MG tablet Commonly known as: LIPITOR Take 1 tablet (80 mg total) by mouth daily at 6 PM.   carvedilol 3.125 MG tablet Commonly known as: COREG Take 1 tablet (3.125 mg total) by mouth 2 (two) times daily with a meal.   clopidogrel 75 MG tablet Commonly known as: PLAVIX Take 1 tablet (75 mg total) by mouth daily.   digoxin 0.125 MG tablet Commonly known as: LANOXIN Take 1 tablet (0.125 mg total) by mouth daily.   furosemide 80 MG tablet Commonly known as: LASIX Take 1 tablet (80 mg total) by mouth daily.   insulin NPH Human 100 UNIT/ML injection Commonly known as: NOVOLIN N Inject 80 Units into the skin daily. May do additional 80 units if evening blood sugar over 150   isosorbide dinitrate 10 MG tablet Commonly known as: ISORDIL Take 1 tablet (10 mg total) by mouth 3 (three) times daily.   multivitamin with minerals Tabs tablet Take 1 tablet by mouth every morning.   oxyCODONE 5 MG immediate release tablet Commonly known as: Oxy IR/ROXICODONE Take 1-2 tablets (5-10 mg total) by mouth every 4 (four) hours as needed for severe pain.   sacubitril-valsartan 24-26 MG Commonly known as: ENTRESTO Take 1 tablet by mouth 2 (two) times daily.   spironolactone 25 MG tablet Commonly known as: ALDACTONE Take 1 tablet (25 mg total) by mouth daily.   VITAMIN B 12 PO Take 2 tablets by mouth daily.   Vitamin D 50 MCG (2000 UT) Caps Take 2,000 Units by mouth every morning.             Durable Medical Equipment  (From admission, onward)         Start     Ordered   03/14/20 1613  For home use only DME 3 n 1  Once    Comments: HD   03/14/20 1612   03/13/20 0716  For home use only DME Walker rolling  Once    Question Answer Comment  Walker: With 5 Inch Wheels   Patient needs a walker to treat with the following condition S/P CABG x 3      03/13/20 0715          Ranferi Clingan, PA-C

## 2020-03-15 NOTE — Progress Notes (Addendum)
Patient ID: Rhonda Roberts, female   DOB: 1956-07-02, 64 y.o.   MRN: [id number]     Advanced Heart Failure Rounding Note  PCP-Cardiologist: Skeet Latch, MD   Subjective:    - S/P CABG x4  4/14 - Extubated 4/15.  - Milrinone stopped 4/20.   Feeling much better wants to go home.  Denies SOB   Objective:   Weight Range: 133.6 kg Body mass index is 53.87 kg/m.   Vital Signs:   Temp:  [98.3 F (36.8 C)-98.8 F (37.1 C)] 98.8 F (37.1 C) (04/23 0409) Pulse Rate:  [80-85] 81 (04/23 0409) Resp:  [14-24] 14 (04/23 0446) BP: (124-144)/(43-103) 144/71 (04/23 0409) SpO2:  [96 %-100 %] 99 % (04/23 0409) Weight:  [133.6 kg] 133.6 kg (04/23 0409) Last BM Date: 03/12/20  Weight change: Filed Weights   03/13/20 0314 03/14/20 0310 03/15/20 0409  Weight: 133.4 kg 133.3 kg 133.6 kg    Intake/Output:   Intake/Output Summary (Last 24 hours) at 03/15/2020 0930 Last data filed at 03/15/2020 0409 Gross per 24 hour  Intake 330 ml  Output 1200 ml  Net -870 ml      Physical Exam  CVP 9-10  General:  Sitting in the chair. No resp difficulty HEENT: normal Neck: supple. no JVD. Carotids 2+ bilat; no bruits. No lymphadenopathy or thryomegaly appreciated. Cor: PMI nondisplaced. Regular rate & rhythm. No rubs, gallops or murmurs. Lungs: clear Abdomen: obese, soft, nontender, nondistended. No hepatosplenomegaly. No bruits or masses. Good bowel sounds. Extremities: no cyanosis, clubbing, rash, edema. LUE edema radial incision approximated. RUE PICC Neuro: alert & orientedx3, cranial nerves grossly intact. moves all 4 extremities w/o difficulty. Affect pleasant    Telemetry   NSR 80s personally checked.   EKG    N/a   Labs    CBC Recent Labs    03/13/20 0327 03/14/20 0412  WBC 13.7* 11.8*  HGB 10.7* 9.6*  HCT 32.9* 29.9*  MCV 87.3 88.5  PLT 353 [id number]   Basic Metabolic Panel Recent Labs    03/13/20 0327 03/14/20 0412  NA 137 134*  K 4.1 4.1  CL 96* 93*  CO2 30  30  GLUCOSE 102* 117*  BUN 31* 35*  CREATININE 1.58* 1.52*  CALCIUM 8.9 8.7*   Liver Function Tests No results for input(s): AST, ALT, ALKPHOS, BILITOT, PROT, ALBUMIN in the last 72 hours. No results for input(s): LIPASE, AMYLASE in the last 72 hours. Cardiac Enzymes No results for input(s): CKTOTAL, CKMB, CKMBINDEX, TROPONINI in the last 72 hours.  BNP: BNP (last 3 results) No results for input(s): BNP in the last 8760 hours.  ProBNP (last 3 results) No results for input(s): PROBNP in the last 8760 hours.   D-Dimer No results for input(s): DDIMER in the last 72 hours. Hemoglobin A1C No results for input(s): HGBA1C in the last 72 hours. Fasting Lipid Panel No results for input(s): CHOL, HDL, LDLCALC, TRIG, CHOLHDL, LDLDIRECT in the last 72 hours. Thyroid Function Tests No results for input(s): TSH, T4TOTAL, T3FREE, THYROIDAB in the last 72 hours.  Invalid input(s): FREET3  Other results:   Imaging    No results found.   Medications:     Scheduled Medications: . amiodarone  200 mg Oral BID  . aspirin EC  81 mg Oral Daily  . atorvastatin  80 mg Oral q1800  . bisacodyl  10 mg Oral Daily   Or  . bisacodyl  10 mg Rectal Daily  . Chlorhexidine Gluconate Cloth  6 each Topical  Daily  . clopidogrel  75 mg Oral Daily  . digoxin  0.125 mg Oral Daily  . docusate sodium  200 mg Oral Daily  . enoxaparin (LOVENOX) injection  40 mg Subcutaneous Q24H  . insulin aspart  0-20 Units Subcutaneous TID AC & HS  . insulin detemir  32 Units Subcutaneous BID  . isosorbide dinitrate  10 mg Oral TID  . pantoprazole  40 mg Oral Daily  . potassium chloride  20 mEq Oral BID  . sacubitril-valsartan  1 tablet Oral BID  . sodium chloride flush  10-40 mL Intracatheter Q12H  . spironolactone  25 mg Oral Daily    Infusions: . sodium chloride Stopped (03/09/20 1512)  . sodium chloride      PRN Medications: sodium chloride, dextrose, metoprolol tartrate, ondansetron (ZOFRAN) IV,  oxyCODONE, traMADol    Assessment/Plan   1. CAD: Admitted with NSTEMI.  LHC with 3VD, now s/p CABG x4 with LIMA-LAD, seq radial-D/OM, RIMA-PDA.  No chest pain. - Continue ASA, Plavix - Continue isordil 10 mg tid (30 days post-op, for radial graft).  - On statin.  2. Acute systolic CHF: Ischemic cardiomyopathy.  Echo pre-op with EF 30%, normal RV, Grade IDD. Milrinone discontinued 4/20.  - CVP 9-10.  Start lasix 80 mg po daily.  - continue Entresto 24-26 bid- anticipate increasing as an outpatient.  - continue spironolactone 25 qd - continue digoxin 0.125 -Add coreg 3.125 mg twice a day.  3. DMII: Hgb A1C 10  - Continue SSI.   - Continue statin  4. UTI: Urine culture 03/04/20 showed E Coli  - Completed Ceftriaxone course  5. Anemia: expected post-surgical blood loss - stable.  6. CKD: Stage.  Creatinine stable 1.52. 7. Rhythm: On amio for A fib prevention. Amio taper in place. Stop at first post op visit if she remains in NSR.  9. HLD: Atorvastatin 80 daily. Plan to check lipids in July if LDL remains >70 will need PCSK9i if LDL not at goal when checked.  8. Deconditioning - continue to ambulate w/ CR/PT -->HH  HF Follow up set up. She has not medical insurance and will need 30 days medications. I have message HF SW Team to follow up on medications. Provided with 30 day free card for entresto and entresto med assist form completed.   Cardiac meds d/c (amio and isordil per CT surgery) ASA 81 mg daily  Plavix 75 mg daily Atorvastatin 80 mg daily Lasix 80 mg po daily Coreg 3.125 mg twice a day  Entresto 24-26 mg twice a day  Spironolactone 25 mg daily Digoxin 0.125 mg daily  Length of Stay: [place], NP  03/15/2020, 9:30 AM  Patient seen with PA, agree with the above note.  She is ready for home today.  Restart diuretic with rising CVP.  Creatinine stable at 1.5.  Agree with starting low dose Coreg.   We will arrange followup in CHF clinic.  She will need BMET in week  or so.  Please send her home on the above meds.   Loralie Champagne 03/15/2020 12:14 PM

## 2020-03-15 NOTE — Progress Notes (Signed)
PT Cancellation Note  Patient Details Name: Rhonda Roberts MRN: [id number] DOB: 28-Jun-1956   Cancelled Treatment:    Reason Eval/Treat Not Completed: Patient at procedure or test/unavailable   Dornell Grasmick B Niah Heinle 03/15/2020, 12:03 PM  Bayard Males, PT Acute Rehabilitation Services Pager: [phone] Office: [phone]

## 2020-03-15 NOTE — Progress Notes (Signed)
      [address]       [place] [address]             [phone]      9 Days Post-Op Procedure(s) (LRB): CORONARY ARTERY BYPASS GRAFTING (CABG) using LIMA to LAD, RIMA to PLB, Left Radial Artery Graft: RAG to Diag1; RAG to OM1. (N/A) RADIAL ARTERY HARVEST (Left) TRANSESOPHAGEAL ECHOCARDIOGRAM (TEE) (N/A) INDOCYANINE GREEN FLUORESCENCE IMAGING (ICG) (N/A)   Subjective:  Up in chair, no complaints.    Objective: Vital signs in last 24 hours: Temp:  [98 F (36.7 C)-98.8 F (37.1 C)] 98.8 F (37.1 C) (04/23 0409) Pulse Rate:  [80-85] 81 (04/23 0409) Cardiac Rhythm: Normal sinus rhythm (04/22 2030) Resp:  [14-24] 14 (04/23 0446) BP: (124-144)/(43-103) 144/71 (04/23 0409) SpO2:  [96 %-100 %] 99 % (04/23 0409) Weight:  [133.6 kg] 133.6 kg (04/23 0409)  Hemodynamic parameters for last 24 hours: CVP:  [6 mmHg] 6 mmHg  Intake/Output from previous day: 04/22 0701 - 04/23 0700 In: 570 [P.O.:540; I.V.:30] Out: 1575 [Urine:1575]  General appearance: alert, cooperative and no distress Heart: regular rate and rhythm Lungs: clear to auscultation bilaterally Abdomen: soft, non-tender; bowel sounds normal; no masses,  no organomegaly Extremities: edema trace Wound: Sternotomy clean and dry, staples remain in place, left radial site clean and dry, some small blisters lateral to incision  Lab Results: Recent Labs    03/13/20 0327 03/14/20 0412  WBC 13.7* 11.8*  HGB 10.7* 9.6*  HCT 32.9* 29.9*  PLT 353 360   BMET:  Recent Labs    03/13/20 0327 03/14/20 0412  NA 137 134*  K 4.1 4.1  CL 96* 93*  CO2 30 30  GLUCOSE 102* 117*  BUN 31* 35*  CREATININE 1.58* 1.52*  CALCIUM 8.9 8.7*    PT/INR: No results for input(s): LABPROT, INR in the last 72 hours. ABG    Component Value Date/Time   PHART 7.429 03/08/2020 1715   HCO3 22.1 03/08/2020 1715   TCO2 22 03/07/2020 1242   ACIDBASEDEF 1.6 03/08/2020 1715   O2SAT 60.5 03/14/2020 0412   CBG (last 3)    Recent Labs    03/14/20 2141 03/15/20 0615 03/15/20 0646  GLUCAP 185* 99 99    Assessment/Plan: S/P Procedure(s) (LRB): CORONARY ARTERY BYPASS GRAFTING (CABG) using LIMA to LAD, RIMA to PLB, Left Radial Artery Graft: RAG to Diag1; RAG to OM1. (N/A) RADIAL ARTERY HARVEST (Left) TRANSESOPHAGEAL ECHOCARDIOGRAM (TEE) (N/A) INDOCYANINE GREEN FLUORESCENCE IMAGING (ICG) (N/A)  1. CV- NSR, BP stable- continue Digoxin, Isordil, Amiodarone, Plavix, Entresto 2. Pulm- no acute issues, continue IS 3. Renal- creatinine stable, hypokalemia resolved, continue Spironolactone 4. DM-resume home insulin regimen at discharge 5. Dispo- patient stable, will d/c home today... patient wishes refills from [place] be sent to [facility] on [address]   LOS: 13 days    Ellwood Handler, PA-C  03/15/2020

## 2020-03-15 NOTE — Progress Notes (Signed)
Sutures removed per order  

## 2020-03-17 LAB — POCT I-STAT, CHEM 8
BUN: 25 mg/dL — ABNORMAL HIGH (ref 8–23)
Calcium, Ion: 1.64 mmol/L (ref 1.15–1.40)
Chloride: 104 mmol/L (ref 98–111)
Creatinine, Ser: 1 mg/dL (ref 0.44–1.00)
Glucose, Bld: 225 mg/dL — ABNORMAL HIGH (ref 70–99)
HCT: 20 % — ABNORMAL LOW (ref 36.0–46.0)
Hemoglobin: 6.8 g/dL — CL (ref 12.0–15.0)
Potassium: 5.1 mmol/L (ref 3.5–5.1)
Sodium: 134 mmol/L — ABNORMAL LOW (ref 135–145)
TCO2: 27 mmol/L (ref 22–32)

## 2020-03-18 ENCOUNTER — Other Ambulatory Visit: Payer: Self-pay | Admitting: *Deleted

## 2020-03-18 ENCOUNTER — Ambulatory Visit: Payer: Medicaid Other | Admitting: Cardiothoracic Surgery

## 2020-03-18 DIAGNOSIS — R112 Nausea with vomiting, unspecified: Secondary | ICD-10-CM

## 2020-03-18 DIAGNOSIS — Z9889 Other specified postprocedural states: Secondary | ICD-10-CM

## 2020-03-18 MED ORDER — ONDANSETRON HCL 4 MG PO TABS: 4.0000 mg | ORAL_TABLET | Freq: Three times a day (TID) | ORAL | 0 refills | Status: DC | PRN

## 2020-03-19 ENCOUNTER — Telehealth (HOSPITAL_COMMUNITY): Payer: Self-pay | Admitting: Pharmacist

## 2020-03-19 NOTE — Telephone Encounter (Signed)
Sent in Manufacturer's Assistance application to [employer] for Entresto.    Application pending, will continue to follow.  Marycarmen Hagey, PharmD, BCPS, BCCP, CPP Heart Failure Clinic Pharmacist [phone]  

## 2020-03-21 ENCOUNTER — Telehealth (HOSPITAL_COMMUNITY): Payer: Self-pay

## 2020-03-21 NOTE — Telephone Encounter (Signed)
Pt Medicaid is pending.

## 2020-03-21 NOTE — Telephone Encounter (Signed)
Attempted to call patient in regards to Cardiac Rehab - LM on VM 

## 2020-03-22 ENCOUNTER — Other Ambulatory Visit: Payer: Self-pay | Admitting: Cardiothoracic Surgery

## 2020-03-22 DIAGNOSIS — Z951 Presence of aortocoronary bypass graft: Secondary | ICD-10-CM

## 2020-03-25 ENCOUNTER — Ambulatory Visit
Admission: RE | Admit: 2020-03-25 | Discharge: 2020-03-25 | Disposition: A | Discharge status: Home / Self Care | Payer: No Typology Code available for payment source | Source: Ambulatory Visit | Attending: Cardiothoracic Surgery | Admitting: Cardiothoracic Surgery

## 2020-03-25 ENCOUNTER — Ambulatory Visit (INDEPENDENT_AMBULATORY_CARE_PROVIDER_SITE_OTHER): Payer: Self-pay | Admitting: Cardiothoracic Surgery

## 2020-03-25 ENCOUNTER — Other Ambulatory Visit: Payer: Self-pay

## 2020-03-25 ENCOUNTER — Other Ambulatory Visit (HOSPITAL_COMMUNITY): Payer: Self-pay | Admitting: Adult Health

## 2020-03-25 ENCOUNTER — Ambulatory Visit (HOSPITAL_COMMUNITY)
Admission: RE | Admit: 2020-03-25 | Discharge: 2020-03-25 | Disposition: A | Discharge status: Home / Self Care | Payer: Self-pay | Source: Ambulatory Visit | Attending: Adult Health | Admitting: Adult Health

## 2020-03-25 ENCOUNTER — Encounter (HOSPITAL_COMMUNITY): Payer: Self-pay

## 2020-03-25 VITALS — BP 150/70 | HR 84 | Wt 292.4 lb

## 2020-03-25 VITALS — BP 140/78 | HR 78 | Temp 97.6°F | Resp 20 | Ht 62.0 in | Wt 293.0 lb

## 2020-03-25 DIAGNOSIS — E785 Hyperlipidemia, unspecified: Secondary | ICD-10-CM | POA: Insufficient documentation

## 2020-03-25 DIAGNOSIS — I2510 Atherosclerotic heart disease of native coronary artery without angina pectoris: Secondary | ICD-10-CM

## 2020-03-25 DIAGNOSIS — Z87891 Personal history of nicotine dependence: Secondary | ICD-10-CM | POA: Insufficient documentation

## 2020-03-25 DIAGNOSIS — N1831 Chronic kidney disease, stage 3a: Secondary | ICD-10-CM | POA: Insufficient documentation

## 2020-03-25 DIAGNOSIS — Z951 Presence of aortocoronary bypass graft: Secondary | ICD-10-CM

## 2020-03-25 DIAGNOSIS — D649 Anemia, unspecified: Secondary | ICD-10-CM | POA: Insufficient documentation

## 2020-03-25 DIAGNOSIS — Z7902 Long term (current) use of antithrombotics/antiplatelets: Secondary | ICD-10-CM | POA: Insufficient documentation

## 2020-03-25 DIAGNOSIS — E1122 Type 2 diabetes mellitus with diabetic chronic kidney disease: Secondary | ICD-10-CM | POA: Insufficient documentation

## 2020-03-25 DIAGNOSIS — D500 Iron deficiency anemia secondary to blood loss (chronic): Secondary | ICD-10-CM

## 2020-03-25 DIAGNOSIS — M1990 Unspecified osteoarthritis, unspecified site: Secondary | ICD-10-CM | POA: Insufficient documentation

## 2020-03-25 DIAGNOSIS — R5381 Other malaise: Secondary | ICD-10-CM

## 2020-03-25 DIAGNOSIS — Z7982 Long term (current) use of aspirin: Secondary | ICD-10-CM | POA: Insufficient documentation

## 2020-03-25 DIAGNOSIS — Z6841 Body Mass Index (BMI) 40.0 and over, adult: Secondary | ICD-10-CM | POA: Insufficient documentation

## 2020-03-25 DIAGNOSIS — I255 Ischemic cardiomyopathy: Secondary | ICD-10-CM | POA: Insufficient documentation

## 2020-03-25 DIAGNOSIS — E669 Obesity, unspecified: Secondary | ICD-10-CM | POA: Insufficient documentation

## 2020-03-25 DIAGNOSIS — Z79899 Other long term (current) drug therapy: Secondary | ICD-10-CM | POA: Insufficient documentation

## 2020-03-25 DIAGNOSIS — I130 Hypertensive heart and chronic kidney disease with heart failure and stage 1 through stage 4 chronic kidney disease, or unspecified chronic kidney disease: Secondary | ICD-10-CM | POA: Insufficient documentation

## 2020-03-25 DIAGNOSIS — I25119 Atherosclerotic heart disease of native coronary artery with unspecified angina pectoris: Secondary | ICD-10-CM

## 2020-03-25 DIAGNOSIS — I5022 Chronic systolic (congestive) heart failure: Secondary | ICD-10-CM | POA: Insufficient documentation

## 2020-03-25 DIAGNOSIS — Z794 Long term (current) use of insulin: Secondary | ICD-10-CM | POA: Insufficient documentation

## 2020-03-25 LAB — BASIC METABOLIC PANEL
Anion gap: 13 (ref 5–15)
BUN: 23 mg/dL (ref 8–23)
CO2: 26 mmol/L (ref 22–32)
Calcium: 9.3 mg/dL (ref 8.9–10.3)
Chloride: 96 mmol/L — ABNORMAL LOW (ref 98–111)
Creatinine, Ser: 1.55 mg/dL — ABNORMAL HIGH (ref 0.44–1.00)
GFR calc Af Amer: 41 mL/min — ABNORMAL LOW (ref >60)
GFR calc non Af Amer: 35 mL/min — ABNORMAL LOW (ref >60)
Glucose, Bld: 162 mg/dL — ABNORMAL HIGH (ref 70–99)
Potassium: 4.7 mmol/L (ref 3.5–5.1)
Sodium: 135 mmol/L (ref 135–145)

## 2020-03-25 LAB — CBC
HCT: 34.7 % — ABNORMAL LOW (ref 36.0–46.0)
Hemoglobin: 10.9 g/dL — ABNORMAL LOW (ref 12.0–15.0)
MCH: 28 pg (ref 26.0–34.0)
MCHC: 31.4 g/dL (ref 30.0–36.0)
MCV: 89.2 fL (ref 80.0–100.0)
Platelets: 510 10*3/uL — ABNORMAL HIGH (ref 150–400)
RBC: 3.89 MIL/uL (ref 3.87–5.11)
RDW: 15.8 % — ABNORMAL HIGH (ref 11.5–15.5)
WBC: 8 10*3/uL (ref 4.0–10.5)
nRBC: 0 % (ref 0.0–0.2)

## 2020-03-25 LAB — BRAIN NATRIURETIC PEPTIDE: B Natriuretic Peptide: 597.6 pg/mL — ABNORMAL HIGH (ref 0.0–100.0)

## 2020-03-25 LAB — DIGOXIN LEVEL: Digoxin Level: 0.9 ng/mL (ref 0.8–2.0)

## 2020-03-25 MED ORDER — ENTRESTO 49-51 MG PO TABS
1.0000 | ORAL_TABLET | Freq: Two times a day (BID) | ORAL | 6 refills | Status: DC
Start: 2020-03-25 — End: 2020-12-12

## 2020-03-25 MED FILL — ISOSORBIDE MN ER 30 MG TAB: 30 | 34 days supply | Qty: 34 | Fill #0

## 2020-03-25 NOTE — Progress Notes (Signed)
Heart and Vascular Care Navigation  03/25/2020  Rhonda Roberts 1956/01/26 [id number]  Reason for Referral: to help get PCP and assist with medication cost concerns.                                                                                                    Assessment:    Pt put on a large amount of new medications following recent hospital stay but does not have insurance and unsure how she will pay for medications moving forward (currently still on medications that were provided by [facility] pharmacy at discharge).      For PCP family is agreeable to [facility] as it is the closest option- CSW referred to case manager at [facility] and am awaiting appt time.                          HRT/VAS Care Coordination    Outpatient Care Team  Social Worker   Social Worker Name:  Tammy Sours- Advanced HF Clinic- [phone]   Living arrangements for the past 2 months  Single Family Home   Lives with:  Adult Children   Patient Current Insurance Coverage  Medicaid Pending applied with hospital [facility] department but only appears to be for retro coverage- pt likely over income limit for ongoing   Patient Has Concern With Paying Medical Bills  Yes   Medical Bill Referrals:  retro medicaid pending   Does Patient Have Prescription Coverage?  No   Patient Prescription Assistance Programs  Heart Failure Fund- had clinic staff send in eligible medications to outpatient pharmacy; Patient Assistance Programs   Patient Assistance Programs Medications  application for entresto assistance through [employer] completed 03/25/20   Home Assistive Devices/Equipment  None   DME Agency  [facility]   [place] ([facility])      Social History:                                                                             SDOH Screenings   Alcohol Screen:   . Last Alcohol Screening Score (AUDIT):   Depression (PHQ2-9):   . PHQ-2 Score:   Financial Resource Strain:   . Difficulty of Paying Living Expenses:   Food  Insecurity:   . Worried About Charity fundraiser in the Last Year:   . [place] in the Last Year:   Housing:   . Last Housing Risk Score:   Physical Activity:   . Days of Exercise per Week:   . Minutes of Exercise per Session:   Social Connections:   . Frequency of Communication with Friends and Family:   . Frequency of Social Gatherings with Friends and Family:   . Attends Religious Services:   . Active  Member of Clubs or Organizations:   . Attends Archivist Meetings:   Marland Kitchen Marital Status:   Stress:   . Feeling of Stress :   Tobacco Use: Medium Risk  . Smoking Tobacco Use: Former Smoker  . Smokeless Tobacco Use: Never Used  Transportation Needs:   . Film/video editor (Medical):   Marland Kitchen Lack of Transportation (Non-Medical):      Other Care Navigation Interventions:     Provided Pharmacy assistance resources Heart Failure Fund, Patient Assistance Programs   Follow-up plan:  CSW to complete [employer] application and submit for review.  Pt to call [place] to inquire about remaining medication cost prior to running out so we can assess if further assistance is needed.  CSW to reach out to pt once [facility] returns message regarding establish PCP appt.  Jorge Ny, LCSW Clinical Social Worker Advanced Heart Failure Clinic Desk#: [phone] Cell#: [phone]

## 2020-03-25 NOTE — Progress Notes (Signed)
PCP: None  Primary HF Cardiologist: Dr Aundra Dubin  CT Surgeon: Dr Orvan Seen   HPI: [name] is a 64 year old with history of DMII, HTN, dyslipidemia, obesity, GERD, diverticulosis, and arthritis.   Presented to [facility] ED on 03/01/2020 with N/V and intermittent chest pain. EKG with ST depression inferior and anterolatera leads. HS Trop [phone]. LHC today as noted below with 3 vessel disease, low EF, and elevated LVEPD. Post cath she was acutely short of breath. Placed on bipap, nitro drip at 10 mcg, and diuresed with IV lasix. CT surgery consulted. Once tuned up she underwent CABGx4. Gradually drips stopped. HF meds started. Discharged to home with her daughter. Discharged to home 03/15/20.  Today she returns for Post HF follow up.Overall feeling fine. Mild SOB with exertion.  Denies PND/Orthopnea. Walking at least 3 times in the house.Using her walker.  BP at at home 125/73. Appetite ok. No fever or chills. Weight at home 287-291 pounds. Taking all medications. Followed by [facility] PT.She is working on her [employer]. She does not have PCP and recently had Hg A1c10.   ECHO 03/04/20 EF 20-25%Grade II DD   LHC 03/04/20  LVEPD 36   99% LAD  80% mid circumflex, 70% ostial circumflex, 99% small ramus. 95% mid LAD  Mid 99% PDA   LVEDP 38 mmHg. EF less than 30%. Global hypokinesis. Anterior wall worsened over walls. ROS: All systems negative except as listed in HPI, PMH and Problem List.  SH:  Social History   Socioeconomic History  . Marital status: Single    Spouse name: Not on file  . Number of children: Not on file  . Years of education: Not on file  . Highest education level: Not on file  Occupational History  . Occupation: retired  Tobacco Use  . Smoking status: Former Smoker    Quit date: 11/23/2004    Years since quitting: 15.3  . Smokeless tobacco: Never Used  Substance and Sexual Activity  . Alcohol use: No  . Drug use: No  . Sexual activity: Not on file  Other  Topics Concern  . Not on file  Social History Narrative  . Not on file   Social Determinants of Health   Financial Resource Strain:   . Difficulty of Paying Living Expenses:   Food Insecurity:   . Worried About Charity fundraiser in the Last Year:   . Arboriculturist in the Last Year:   Transportation Needs:   . Film/video editor (Medical):   Marland Kitchen Lack of Transportation (Non-Medical):   Physical Activity:   . Days of Exercise per Week:   . Minutes of Exercise per Session:   Stress:   . Feeling of Stress :   Social Connections:   . Frequency of Communication with Friends and Family:   . Frequency of Social Gatherings with Friends and Family:   . Attends Religious Services:   . Active Member of Clubs or Organizations:   . Attends Archivist Meetings:   Marland Kitchen Marital Status:   Intimate Partner Violence:   . Fear of Current or Ex-Partner:   . Emotionally Abused:   Marland Kitchen Physically Abused:   . Sexually Abused:     FH: No family history on file.  Past Medical History:  Diagnosis Date  . Diabetes mellitus without complication ([place])   . GERD (gastroesophageal reflux disease)   . Headache   . Hypertension     Current Outpatient Medications  Medication Sig Dispense  Refill  . amiodarone (PACERONE) 200 MG tablet Take 1 tablet (200 mg total) by mouth 2 (two) times daily. For 7 days, then decrease to 200 mg daily (Patient taking differently: Take 200 mg by mouth daily. ) 60 tablet 1  . aspirin 81 MG chewable tablet Chew 81 mg by mouth daily.    Marland Kitchen atorvastatin (LIPITOR) 80 MG tablet Take 1 tablet (80 mg total) by mouth daily at 6 PM. 30 tablet 3  . carvedilol (COREG) 3.125 MG tablet Take 1 tablet (3.125 mg total) by mouth 2 (two) times daily with a meal. 60 tablet 2  . Cholecalciferol (VITAMIN D) 2000 UNITS CAPS Take 2,000 Units by mouth every morning.     . clopidogrel (PLAVIX) 75 MG tablet Take 1 tablet (75 mg total) by mouth daily. 30 tablet 3  . Cyanocobalamin (VITAMIN B  12 PO) Take 2 tablets by mouth daily.     . digoxin (LANOXIN) 0.125 MG tablet Take 1 tablet (0.125 mg total) by mouth daily. 30 tablet 3  . furosemide (LASIX) 80 MG tablet Take 1 tablet (80 mg total) by mouth daily. 30 tablet 2  . insulin NPH Human (NOVOLIN N) 100 UNIT/ML injection Inject 80 Units into the skin daily. May do additional 80 units if evening blood sugar over 150    . isosorbide dinitrate (ISORDIL) 10 MG tablet Take 1 tablet (10 mg total) by mouth 3 (three) times daily. 90 tablet 0  . Multiple Vitamin (MULTIVITAMIN WITH MINERALS) TABS Take 1 tablet by mouth every morning.    . ondansetron (ZOFRAN) 4 MG tablet Take 1 tablet (4 mg total) by mouth every 8 (eight) hours as needed for up to 20 doses for nausea or vomiting. 20 tablet 0  . oxyCODONE (OXY IR/ROXICODONE) 5 MG immediate release tablet Take 1-2 tablets (5-10 mg total) by mouth every 4 (four) hours as needed for severe pain. 42 tablet 0  . sacubitril-valsartan (ENTRESTO) 24-26 MG Take 1 tablet by mouth 2 (two) times daily. 60 tablet 3  . spironolactone (ALDACTONE) 25 MG tablet Take 1 tablet (25 mg total) by mouth daily. 30 tablet 3   No current facility-administered medications for this encounter.    Vitals:   03/25/20 1336  BP: (!) 150/70  Pulse: 84  SpO2: 95%  Weight: 132.6 kg (292 lb 6.4 oz)   Wt Readings from Last 3 Encounters:  03/25/20 132.6 kg (292 lb 6.4 oz)  03/25/20 132.9 kg (293 lb)  03/15/20 133.6 kg (294 lb 8.6 oz)     PHYSICAL EXAM: General:  Ambulated in the clinic with a walker. No resp difficulty HEENT: normal Neck: supple. JVP flat. Carotids 2+ bilaterally; no bruits. No lymphadenopathy or thryomegaly appreciated. Cor: PMI normal. Regular rate & rhythm. No rubs, gallops or murmurs. Sternal incision with steri strips  Lungs: clear Abdomen: obese, soft, nontender, nondistended. No hepatosplenomegaly. No bruits or masses. Good bowel sounds. Extremities: no cyanosis, clubbing, rash, edema. LUE radial  incision approximated.  Neuro: alert & orientedx3, cranial nerves grossly intact. Moves all 4 extremities w/o difficulty. Affect pleasant.   ECG: SR 76 bom    ASSESSMENT & PLAN: 1. CAD:  LHC with 3VD, now s/p CABG x4 with LIMA-LAD, seq radial-D/OM, RIMA-PDA.  No chest pain. - Continue ASA, Plavix - Continue isordil 10 mg tid for total of 30 day then will switch to imdur 30 mg daily.   - On statin.  2. Chronic Systolic CHF: Ischemic cardiomyopathy.  Echo pre-op with 03/04/20 EF 30%,  normal RV, Grade IDD. -NYHA III.  Volume status stable. Continue lasix 80 mg daily.  - Increase entresto 49-51 mg twice a day. Set up with patient assistance.   - continue spironolactone 25 qd - continue digoxin 0.125 -Continue coreg 3.125 mg twice a day. - Plan to repeat ECHO the end of July.   3. DMII: 02/2020 Hgb A1C 10  -   On insulin  - Continue statin - Refer to PCP today   4. . Anemia: -No bleeding issues. Check CBC   5. CKD: Stage IIIa.  Creatinine stable 1.52. Check BMET 6. Rhythm: On amio for A fib prevention. Amio taper in place. In NSR today.  Will stop amiodarone.  7. HLD: Atorvastatin 80 daily. Plan to check lipids in July if LDL remains >70 will need PCSK9i if LDL not at goal when checked.  8. Deconditioning - Continue HHPT  Check BMET and CBC.  Referred to HF SW. No insurance/no PCP. Discussed medication changes and plan for the next 3 moths.   Cande Mastropietro NP-C  8:51 PM

## 2020-03-25 NOTE — Progress Notes (Signed)
Cardiology Clinic Note   Patient Name: NAMIRAH SANTAANA Date of Encounter: 03/26/2020  Primary Care Provider:  Suella Broad, FNP Primary Cardiologist:  Skeet Latch, MD  Patient Profile    [name] 64 year old female presents today for follow-up evaluation of her chest pain status post CABG x4.  Past Medical History    Past Medical History:  Diagnosis Date  . Diabetes mellitus without complication ([place])   . GERD (gastroesophageal reflux disease)   . Headache   . Hypertension    Past Surgical History:  Procedure Laterality Date  . ABDOMINAL HYSTERECTOMY    . CESAREAN SECTION  1987  . COLONOSCOPY WITH PROPOFOL N/A 05/08/2016   Procedure: COLONOSCOPY WITH PROPOFOL;  Surgeon: Carol Ada, MD;  Location: [facility];  Service: Endoscopy;  Laterality: N/A;  . CORONARY ARTERY BYPASS GRAFT N/A 03/06/2020   Procedure: CORONARY ARTERY BYPASS GRAFTING (CABG) using LIMA to LAD, RIMA to PLB, Left Radial Artery Graft: RAG to [place]; RAG to [place].;  Surgeon: Wonda Olds, MD;  Location: [place];  Service: Open Heart Surgery;  Laterality: N/A;  BILATERAL IMA  . LEFT HEART CATH AND CORONARY ANGIOGRAPHY N/A 03/04/2020   Procedure: LEFT HEART CATH AND CORONARY ANGIOGRAPHY;  Surgeon: Belva Crome, MD;  Location: [place] [facility];  Service: Cardiovascular;  Laterality: N/A;  . RADIAL ARTERY HARVEST Left 03/06/2020   Procedure: RADIAL ARTERY HARVEST;  Surgeon: Wonda Olds, MD;  Location: [place];  Service: Open Heart Surgery;  Laterality: Left;  . TEE WITHOUT CARDIOVERSION N/A 03/06/2020   Procedure: TRANSESOPHAGEAL ECHOCARDIOGRAM (TEE);  Surgeon: Wonda Olds, MD;  Location: [place];  Service: Open Heart Surgery;  Laterality: N/A;    Allergies  Allergies  Allergen Reactions  . Sulfa Antibiotics Itching, Swelling and Rash    History of Present Illness    Ms. Lillard has a PMH of hypertension, dyslipidemia, diabetes mellitus type 2, obesity, diverticulosis,  arthritis, and GERD.  She presented to the [facility] emergency department on 03/01/2020 with nausea vomiting and intermittent chest pain.  Her EKG showed ST depression in inferior and anterior lateral leads.  Her high-sensitivity troponins were elevated x3.  A cardiac catheterization showed multivessel disease, low EF and elevated LVEDP.  Post cardiac catheterization she was placed on BiPAP due to being acutely short of breath.  She received IV diuresis IV nitroglycerin and CVTS was consulted.  She underwent CABG x4.    She was discharged home on 03/15/2020.She is also being followed by the advanced heart failure team.   She was seen in follow-up by Darrick Grinder, NP on 03/25/2020.  During that time she was noted to have mild DOE.  She was working with home health PT and walking at least 3 times daily in her house with a walker.  Her blood pressure was 125/73.  Her weight.  Between 287-291 pounds and she was compliant with her medication.  She presents the clinic today for follow-up evaluation and states she feels well.  She has been working with physical therapy 2-3 times per week and walking on her own 3 times per day.  She is weighing herself daily and is monitoring for weight gain of 3 pounds overnight or 5 pounds in 1 week.  She states that she saw heart failure yesterday and surgery.  She feels that her recovery is going well at this point.  She states that she received information about low-sodium diet.  I will give her the salty 6  today.  I will have her follow-up with Dr. Oval Linsey in 3 months.  Today she denies chest pain, shortness of breath, lower extremity edema, fatigue, palpitations, melena, hematuria, hemoptysis, diaphoresis, weakness, presyncope, syncope, orthopnea, and PND.   Home Medications    Prior to Admission medications   Medication Sig Start Date End Date Taking? Authorizing Provider  amiodarone (PACERONE) 200 MG tablet Take 1 tablet (200 mg total) by mouth 2 (two) times daily. For 7  days, then decrease to 200 mg daily 03/15/20   Barrett, Lodema Hong, PA-C  aspirin 81 MG chewable tablet Chew 81 mg by mouth daily.    [provider]  atorvastatin (LIPITOR) 80 MG tablet Take 1 tablet (80 mg total) by mouth daily at 6 PM. 03/15/20   Barrett, Erin R, PA-C  carvedilol (COREG) 3.125 MG tablet Take 1 tablet (3.125 mg total) by mouth 2 (two) times daily with a meal. 03/15/20   Atkins, Glenice Bow, MD  Cholecalciferol (VITAMIN D) 2000 UNITS CAPS Take 2,000 Units by mouth every morning.     [provider]  clopidogrel (PLAVIX) 75 MG tablet Take 1 tablet (75 mg total) by mouth daily. 03/15/20   Barrett, Erin R, PA-C  Cyanocobalamin (VITAMIN B 12 PO) Take 2 tablets by mouth daily.     [provider]  digoxin (LANOXIN) 0.125 MG tablet Take 1 tablet (0.125 mg total) by mouth daily. 03/15/20   Barrett, Erin R, PA-C  furosemide (LASIX) 80 MG tablet Take 1 tablet (80 mg total) by mouth daily. 03/15/20   Wonda Olds, MD  insulin NPH Human (NOVOLIN N) 100 UNIT/ML injection Inject 80 Units into the skin daily. May do additional 80 units if evening blood sugar over 150    [provider]  isosorbide dinitrate (ISORDIL) 10 MG tablet Take 1 tablet (10 mg total) by mouth 3 (three) times daily. 03/15/20   Barrett, Erin R, PA-C  Multiple Vitamin (MULTIVITAMIN WITH MINERALS) TABS Take 1 tablet by mouth every morning.    [provider]  ondansetron (ZOFRAN) 4 MG tablet Take 1 tablet (4 mg total) by mouth every 8 (eight) hours as needed for up to 20 doses for nausea or vomiting. 03/18/20   Wonda Olds, MD  oxyCODONE (OXY IR/ROXICODONE) 5 MG immediate release tablet Take 1-2 tablets (5-10 mg total) by mouth every 4 (four) hours as needed for severe pain. 03/15/20   Barrett, Erin R, PA-C  sacubitril-valsartan (ENTRESTO) 24-26 MG Take 1 tablet by mouth 2 (two) times daily. 03/15/20   Barrett, Lodema Hong, PA-C  spironolactone (ALDACTONE) 25 MG tablet Take 1 tablet (25 mg  total) by mouth daily. 03/15/20   Barrett, Lodema Hong, PA-C    Family History    No family history on file. has no family status information on file.   Social History    Social History   Socioeconomic History  . Marital status: Single    Spouse name: Not on file  . Number of children: Not on file  . Years of education: Not on file  . Highest education level: Not on file  Occupational History  . Occupation: retired  Tobacco Use  . Smoking status: Former Smoker    Quit date: 11/23/2004    Years since quitting: 15.3  . Smokeless tobacco: Never Used  Substance and Sexual Activity  . Alcohol use: No  . Drug use: No  . Sexual activity: Not on file  Other Topics Concern  . Not on file  Social History Narrative  . Not on file   Social Determinants of Health   Financial Resource Strain:   . Difficulty of Paying Living Expenses:   Food Insecurity:   . Worried About Charity fundraiser in the Last Year:   . Arboriculturist in the Last Year:   Transportation Needs:   . Film/video editor (Medical):   Marland Kitchen Lack of Transportation (Non-Medical):   Physical Activity:   . Days of Exercise per Week:   . Minutes of Exercise per Session:   Stress:   . Feeling of Stress :   Social Connections:   . Frequency of Communication with Friends and Family:   . Frequency of Social Gatherings with Friends and Family:   . Attends Religious Services:   . Active Member of Clubs or Organizations:   . Attends Archivist Meetings:   Marland Kitchen Marital Status:   Intimate Partner Violence:   . Fear of Current or Ex-Partner:   . Emotionally Abused:   Marland Kitchen Physically Abused:   . Sexually Abused:      Review of Systems    General:  No chills, fever, night sweats or weight changes.  Cardiovascular:  No chest pain, dyspnea on exertion, edema, orthopnea, palpitations, paroxysmal nocturnal dyspnea. Dermatological: No rash, lesions/masses Respiratory: No cough, dyspnea Urologic: No hematuria,  dysuria Abdominal:   No nausea, vomiting, diarrhea, bright red blood per rectum, melena, or hematemesis Neurologic:  No visual changes, wkns, changes in mental status. All other systems reviewed and are otherwise negative except as noted above.  Physical Exam    VS:  BP 109/67   Pulse 74   Ht 5\' 2"  (1.575 m)   Wt 295 lb 9.6 oz (134.1 kg)   SpO2 96%   BMI 54.07 kg/m  , BMI Body mass index is 54.07 kg/m. GEN: Well nourished, well developed, in no acute distress. HEENT: normal. Neck: Supple, no JVD, carotid bruits, or masses. Cardiac: RRR, no murmurs, rubs, or gallops. No clubbing, cyanosis, edema.  Radials/DP/PT 2+ and equal bilaterally.  Respiratory:  Respirations regular and unlabored, clear to auscultation bilaterally. GI: Soft, nontender, nondistended, BS + x 4. MS: no deformity or atrophy. Skin: warm and dry, no rash. Neuro:  Strength and sensation are intact. Psych: Normal affect.  Accessory Clinical Findings    ECG personally reviewed by me today-normal sinus rhythm ST and T wave abnormality consider lateral ischemia 74 bpm- No acute changes  EKG 03/25/2020 Normal sinus rhythm T wave abnormality consider lateral ischemia 76 bpm  Echocardiogram 03/04/2020 IMPRESSIONS    1. Left ventricular ejection fraction, by estimation, is 25 to 30%. The  left ventricle has severely decreased function. The left ventricle  demonstrates regional wall motion abnormalities (see scoring  diagram/findings for description). There is mild left  ventricular hypertrophy of the basal-septal segment. Left ventricular  diastolic parameters are consistent with Grade I diastolic dysfunction  (impaired relaxation). Elevated left ventricular end-diastolic pressure.  2. Right ventricular systolic function is normal. The right ventricular  size is normal. Tricuspid regurgitation signal is inadequate for assessing  PA pressure.  3. Trivial mitral valve regurgitation.  4. The aortic valve is  tricuspid.  Assessment & Plan   1.  Coronary artery disease-no chest pain today.  Status post left heart cath that showed multivessel disease and status post CABG x4  Continue aspirin, Plavix, Isordil, statin Heart healthy low-sodium diet-salty 6 given Increase physical activity as tolerated-working with home health PT  Chronic systolic  congestive heart failure-no increased work of breathing today, euvolemic.  Echocardiogram 03/04/2020 showed LVEF 30%, G1 DD. Continue spironolactone, digoxin, carvedilol Plans to repeat echocardiogram at the end of July  Atrial fibrillation-EKG 03/25/2020 showed normal sinus rhythm.  Heart rate today 74 bpm Continue amiodarone Avoid triggers caffeine, chocolate, EtOH etc. Heart healthy low-sodium diet Increase physical activity as tolerated  Hyperlipidemia-03/02/2020: Cholesterol 216; HDL 36; LDL Cholesterol 159; Triglycerides 106; VLDL 21 Continue atorvastatin Heart healthy low-sodium high-fiber diet Increase physical activity as tolerated Repeat lipid and LFTs 7/21  CKD stage III-creatinine 1.52 on 03/14/2020 Followed by PCP  Disposition: Follow-up with Dr. Oval Linsey in 3 months.  Jossie Ng. Arel Tippen NP-C    03/26/2020, 10:22 AM [address] Office [phone] Fax [phone]

## 2020-03-25 NOTE — Patient Instructions (Addendum)
INCREASE Entresto to 49/51 mg, one tab twice daily  Labs today We will only contact you if something comes back abnormal or we need to make some changes. Otherwise no news is good news!   Your physician recommends that you schedule a follow-up appointment in: 2 weeks  in the Advanced Practitioners (PA/NP) Clinic    Do the following things EVERYDAY: 1) Weigh yourself in the morning before breakfast. Write it down and keep it in a log. 2) Take your medicines as prescribed 3) Eat low salt foods--Limit salt (sodium) to 2000 mg per day.  4) Stay as active as you can everyday 5) Limit all fluids for the day to less than 2 liters  At the [facility], you and your health needs are our priority. As part of our continuing mission to provide you with exceptional heart care, we have created designated Provider Care Teams. These Care Teams include your primary Cardiologist (physician) and Advanced Practice Providers (APPs- Physician Assistants and Nurse Practitioners) who all work together to provide you with the care you need, when you need it.   You may see any of the following providers on your designated Care Team at your next follow up: Marland Kitchen Dr Glori Bickers . Dr Loralie Champagne . Darrick Grinder, NP . Lyda Jester, PA . Audry Riles, PharmD   Please be sure to bring in all your medications bottles to every appointment.

## 2020-03-25 NOTE — Progress Notes (Signed)
[address]       [place] [address]             [phone]     CARDIOTHORACIC SURGERY OFFICE NOTE  Referring Provider is Skeet Latch, MD Primary Cardiologist is Skeet Latch, MD PCP is Suella Broad, FNP   HPI:  64 yo lady underwent CABG 03/06/20 with "all arterial" strategy and did well afterwards. No presents for 1st outpatient clinic appointment. No complaints of chest pain or SOB. Denies f/c, n/v.    Current Outpatient Medications  Medication Sig Dispense Refill  . amiodarone (PACERONE) 200 MG tablet Take 1 tablet (200 mg total) by mouth 2 (two) times daily. For 7 days, then decrease to 200 mg daily 60 tablet 1  . aspirin 81 MG chewable tablet Chew 81 mg by mouth daily.    Marland Kitchen atorvastatin (LIPITOR) 80 MG tablet Take 1 tablet (80 mg total) by mouth daily at 6 PM. 30 tablet 3  . carvedilol (COREG) 3.125 MG tablet Take 1 tablet (3.125 mg total) by mouth 2 (two) times daily with a meal. 60 tablet 2  . Cholecalciferol (VITAMIN D) 2000 UNITS CAPS Take 2,000 Units by mouth every morning.     . clopidogrel (PLAVIX) 75 MG tablet Take 1 tablet (75 mg total) by mouth daily. 30 tablet 3  . Cyanocobalamin (VITAMIN B 12 PO) Take 2 tablets by mouth daily.     . digoxin (LANOXIN) 0.125 MG tablet Take 1 tablet (0.125 mg total) by mouth daily. 30 tablet 3  . furosemide (LASIX) 80 MG tablet Take 1 tablet (80 mg total) by mouth daily. 30 tablet 2  . insulin NPH Human (NOVOLIN N) 100 UNIT/ML injection Inject 80 Units into the skin daily. May do additional 80 units if evening blood sugar over 150    . isosorbide dinitrate (ISORDIL) 10 MG tablet Take 1 tablet (10 mg total) by mouth 3 (three) times daily. 90 tablet 0  . Multiple Vitamin (MULTIVITAMIN WITH MINERALS) TABS Take 1 tablet by mouth every morning.    . ondansetron (ZOFRAN) 4 MG tablet Take 1 tablet (4 mg total) by mouth every 8 (eight) hours as needed for up to 20 doses for nausea or vomiting. 20  tablet 0  . oxyCODONE (OXY IR/ROXICODONE) 5 MG immediate release tablet Take 1-2 tablets (5-10 mg total) by mouth every 4 (four) hours as needed for severe pain. 42 tablet 0  . sacubitril-valsartan (ENTRESTO) 24-26 MG Take 1 tablet by mouth 2 (two) times daily. 60 tablet 3  . spironolactone (ALDACTONE) 25 MG tablet Take 1 tablet (25 mg total) by mouth daily. 30 tablet 3   No current facility-administered medications for this visit.      Physical Exam:   BP 140/78   Pulse 78   Temp 97.6 F (36.4 C) (Skin)   Resp 20   Ht 5\' 2"  (1.575 m)   Wt 132.9 kg   SpO2 94%   BMI 53.59 kg/m   General:  Well-appearing, NAD  Chest:   cta  CV:   rrr  Incisions:  Healing well; staples removed from sternal incision  Extremities:  Mild edema  Diagnostic Tests:  CXR with clear lung fields   Impression:  Doing very well after CABG  Plan: Medication regimen changes per Adv Heart Failure team (appointment today at 1330) F/u in 2 weeks with repeat CXR Continue sternal precautions in the meantime  I spent in excess of 20 minutes during  the conduct of this office consultation and >50% of this time involved direct face-to-face encounter with the patient for counseling and/or coordination of their care.  Level 2                 10 minutes Level 3                 15 minutes Level 4                 25 minutes Level 5                 40 minutes  B. Murvin Natal, MD 03/25/2020 12:05 PM

## 2020-03-25 NOTE — Progress Notes (Signed)
Medication Samples have been provided to the patient.  Drug name: entresto       Strength: 49/51        Qty:  56 LOT: [id number]  Exp.Date: 04/2021  Dosing instructions: one tab twice daily  The patient has been instructed regarding the correct time, dose, and frequency of taking this medication, including desired effects and most common side effects.   Garlan Fair M 2:24 PM 03/25/2020

## 2020-03-26 ENCOUNTER — Ambulatory Visit (INDEPENDENT_AMBULATORY_CARE_PROVIDER_SITE_OTHER): Payer: Self-pay | Admitting: General Practice

## 2020-03-26 ENCOUNTER — Telehealth (HOSPITAL_COMMUNITY): Payer: Self-pay | Admitting: Licensed Clinical Social Worker

## 2020-03-26 ENCOUNTER — Encounter: Payer: Self-pay | Admitting: General Practice

## 2020-03-26 VITALS — BP 109/67 | HR 74 | Ht 62.0 in | Wt 295.6 lb

## 2020-03-26 DIAGNOSIS — I214 Non-ST elevation (NSTEMI) myocardial infarction: Secondary | ICD-10-CM

## 2020-03-26 DIAGNOSIS — N1831 Chronic kidney disease, stage 3a: Secondary | ICD-10-CM

## 2020-03-26 DIAGNOSIS — I25119 Atherosclerotic heart disease of native coronary artery with unspecified angina pectoris: Secondary | ICD-10-CM

## 2020-03-26 DIAGNOSIS — Z951 Presence of aortocoronary bypass graft: Secondary | ICD-10-CM

## 2020-03-26 DIAGNOSIS — E1165 Type 2 diabetes mellitus with hyperglycemia: Secondary | ICD-10-CM

## 2020-03-26 DIAGNOSIS — I5021 Acute systolic (congestive) heart failure: Secondary | ICD-10-CM

## 2020-03-26 DIAGNOSIS — E7800 Pure hypercholesterolemia, unspecified: Secondary | ICD-10-CM

## 2020-03-26 NOTE — Telephone Encounter (Signed)
Completed [employer] application for [employer] assistance faxed in for review- fax confirmation received- awaiting determination  Jorge Ny, [facility] Desk#: [phone] Cell#: [phone]

## 2020-03-26 NOTE — Patient Instructions (Signed)
Medication Instructions:  Your physician recommends that you continue on your current medications as directed. Please refer to the Current Medication list given to you today.  *If you need a refill on your cardiac medications before your next appointment, please call your pharmacy*   Follow-Up: At [facility], you and your health needs are our priority.  As part of our continuing mission to provide you with exceptional heart care, we have created designated Provider Care Teams.  These Care Teams include your primary Cardiologist (physician) and Advanced Practice Providers (APPs -  Physician Assistants and Nurse Practitioners) who all work together to provide you with the care you need, when you need it.  We recommend signing up for the patient portal called "MyChart".  Sign up information is provided on this After Visit Summary.  MyChart is used to connect with patients for Virtual Visits (Telemedicine).  Patients are able to view lab/test results, encounter notes, upcoming appointments, etc.  Non-urgent messages can be sent to your provider as well.   To learn more about what you can do with MyChart, go to [email or web address].    Your next appointment:   3 month(s)  The format for your next appointment:   In Person  Provider:   Skeet Latch, MD   Other Instructions  Please review the [place] sheet given to you today.  Your physician recommends that you weigh, daily, at the same time every day, and in the same amount of clothing. Please record your daily weights on the handout provided and bring it to your next appointment.   Please continue with Physical Therapy.

## 2020-03-28 ENCOUNTER — Telehealth: Payer: Self-pay | Admitting: Cardiovascular Disease

## 2020-03-28 NOTE — Telephone Encounter (Signed)
LMTCB

## 2020-03-28 NOTE — Telephone Encounter (Signed)
New Message    Pt c/o BP issue: STAT if pt c/o blurred vision, one-sided weakness or slurred speech  1. What are your last 5 BP readings? 162/92 This morning   2. Are you having any other symptoms (ex. Dizziness, headache, blurred vision, passed out)? No   3. What is your BP issue? Pts caregiver is calling and says the pt had an elevated bp reading this morning and they were told at her appt yesterday to call if her BP elevates     Please call back

## 2020-03-28 NOTE — Telephone Encounter (Signed)
Follow up  Patient is calling in to report blood pressure since it has been a little over an hour. Blood pressure is now 100/66 HR 82.

## 2020-03-28 NOTE — Telephone Encounter (Signed)
Returned call to [employer] - 162/92 and HR 75 - this is before any AM medications Reviewed with Hattie all the meds that patient takes that can impact BP Asked that she have patient take normal AM meds and call back in about 1 hour with updated reading  5/6am - 162/92 (asked patient to repeat now, as this is not a normal reading for her and they are questioning if they checked right) 5/5am - 96/56 before meds 5/4 - Rhonda Ou FNP visit   Patient/caregiver will call back in about 1 hour with update on BP

## 2020-04-01 ENCOUNTER — Other Ambulatory Visit: Payer: Self-pay

## 2020-04-01 ENCOUNTER — Ambulatory Visit: Payer: MEDICAID | Attending: Family Medicine | Admitting: Family Medicine

## 2020-04-01 ENCOUNTER — Encounter: Payer: Self-pay | Admitting: Family Medicine

## 2020-04-01 VITALS — BP 126/80 | HR 85 | Temp 97.9°F | Ht 62.0 in | Wt 293.6 lb

## 2020-04-01 DIAGNOSIS — Z79899 Other long term (current) drug therapy: Secondary | ICD-10-CM

## 2020-04-01 DIAGNOSIS — E1165 Type 2 diabetes mellitus with hyperglycemia: Secondary | ICD-10-CM

## 2020-04-01 DIAGNOSIS — N183 Chronic kidney disease, stage 3 unspecified: Secondary | ICD-10-CM

## 2020-04-01 DIAGNOSIS — Z9889 Other specified postprocedural states: Secondary | ICD-10-CM

## 2020-04-01 DIAGNOSIS — I5022 Chronic systolic (congestive) heart failure: Secondary | ICD-10-CM

## 2020-04-01 DIAGNOSIS — R112 Nausea with vomiting, unspecified: Secondary | ICD-10-CM

## 2020-04-01 DIAGNOSIS — I25119 Atherosclerotic heart disease of native coronary artery with unspecified angina pectoris: Secondary | ICD-10-CM

## 2020-04-01 DIAGNOSIS — Z789 Other specified health status: Secondary | ICD-10-CM

## 2020-04-01 DIAGNOSIS — Z951 Presence of aortocoronary bypass graft: Secondary | ICD-10-CM

## 2020-04-01 DIAGNOSIS — E7800 Pure hypercholesterolemia, unspecified: Secondary | ICD-10-CM

## 2020-04-01 DIAGNOSIS — Z8679 Personal history of other diseases of the circulatory system: Secondary | ICD-10-CM

## 2020-04-01 MED ORDER — OXYCODONE HCL 5 MG PO TABS: 5.0000 mg | ORAL_TABLET | Freq: Four times a day (QID) | ORAL | 0 refills | Status: DC | PRN

## 2020-04-01 MED ORDER — ONDANSETRON HCL 4 MG PO TABS: 4.0000 mg | ORAL_TABLET | Freq: Three times a day (TID) | ORAL | 0 refills | Status: DC | PRN

## 2020-04-01 NOTE — Patient Instructions (Signed)
Chronic Kidney Disease, Adult Chronic kidney disease (CKD) happens when the kidneys are damaged over a long period of time. The kidneys are two organs that help with:  Getting rid of waste and extra fluid from the blood.  Making hormones that maintain the amount of fluid in your tissues and blood vessels.  Making sure that the body has the right amount of fluids and chemicals. Most of the time, CKD does not go away, but it can usually be controlled. Steps must be taken to slow down the kidney damage or to stop it from getting worse. If this is not done, the kidneys may stop working. Follow these instructions at home: Medicines  Take over-the-counter and prescription medicines only as told by your doctor. You may need to change the amount of medicines you take.  Do not take any new medicines unless your doctor says it is okay. Many medicines can make your kidney damage worse.  Do not take any vitamin and supplements unless your doctor says it is okay. Many vitamins and supplements can make your kidney damage worse. General instructions  Follow a diet as told by your doctor. You may need to stay away from: ? Alcohol. ? Salty foods. ? Foods that are high in:  Potassium.  Calcium.  Protein.  Do not use any products that contain nicotine or tobacco, such as cigarettes and e-cigarettes. If you need help quitting, ask your doctor.  Keep track of your blood pressure at home. Tell your doctor about any changes.  If you have diabetes, keep track of your blood sugar as told by your doctor.  Try to stay at a healthy weight. If you need help, ask your doctor.  Exercise at least 30 minutes a day, 5 days a week.  Stay up-to-date with your shots (immunizations) as told by your doctor.  Keep all follow-up visits as told by your doctor. This is important. Contact a doctor if:  Your symptoms get worse.  You have new symptoms. Get help right away if:  You have symptoms of end-stage  kidney disease. These may include: ? Headaches. ? Numbness in your hands or feet. ? Easy bruising. ? Having hiccups often. ? Chest pain. ? Shortness of breath. ? Stopping of menstrual periods in women.  You have a fever.  You have very little pee (urine).  You have pain or bleeding when you pee. Summary  Chronic kidney disease (CKD) happens when the kidneys are damaged over a long period of time.  Most of the time, this condition does not go away, but it can usually be controlled. Steps must be taken to slow down the kidney damage or to stop it from getting worse.  Treatment may include a combination of medicines and lifestyle changes. This information is not intended to replace advice given to you by your health care provider. Make sure you discuss any questions you have with your health care provider. Document Revised: 10/22/2017 Document Reviewed: 12/14/2016 Elsevier Patient Education  [place].  Heart Failure, Diagnosis  Heart failure means that your heart is not able to pump blood in the right way. This makes it hard for your body to work well. Heart failure is usually a long-term (chronic) condition. You must take good care of yourself and follow your treatment plan from your doctor. What are the causes? This condition may be caused by:  High blood pressure.  Build up of cholesterol and fat in the arteries.  Heart attack. This injures the heart  muscle.  Heart valves that do not open and close properly.  Damage of the heart muscle. This is also called cardiomyopathy.  Lung disease.  Abnormal heart rhythms. What increases the risk? The risk of heart failure goes up as a person ages. This condition is also more likely to develop in people who:  Are overweight.  Are female.  Smoke or chew tobacco.  Abuse alcohol or illegal drugs.  Have taken medicines that can damage the heart.  Have diabetes.  Have abnormal heart rhythms.  Have thyroid  problems.  Have low blood counts (anemia). What are the signs or symptoms? Symptoms of this condition include:  Shortness of breath.  Coughing.  Swelling of the feet, ankles, legs, or belly.  Losing weight for no reason.  Trouble breathing.  Waking from sleep because of the need to sit up and get more air.  Rapid heartbeat.  Being very tired.  Feeling dizzy, or feeling like you may pass out (faint).  Having no desire to eat.  Feeling like you may vomit (nauseous).  Peeing (urinating) more at night.  Feeling confused. How is this treated?     This condition may be treated with:  Medicines. These can be given to treat blood pressure and to make the heart muscles stronger.  Changes in your daily life. These may include eating a healthy diet, staying at a healthy body weight, quitting tobacco and illegal drug use, or doing exercises.  Surgery. Surgery can be done to open blocked valves, or to put devices in the heart, such as pacemakers.  A donor heart (heart transplant). You will receive a healthy heart from a donor. Follow these instructions at home:  Treat other conditions as told by your doctor. These may include high blood pressure, diabetes, thyroid disease, or abnormal heart rhythms.  Learn as much as you can about heart failure.  Get support as you need it.  Keep all follow-up visits as told by your doctor. This is important. Summary  Heart failure means that your heart is not able to pump blood in the right way.  This condition is caused by high blood pressure, heart attack, or damage of the heart muscle.  Symptoms of this condition include shortness of breath and swelling of the feet, ankles, legs, or belly. You may also feel very tired or feel like you may vomit.  You may be treated with medicines, surgery, or changes in your daily life.  Treat other health conditions as told by your doctor. This information is not intended to replace advice  given to you by your health care provider. Make sure you discuss any questions you have with your health care provider. Document Revised: 01/27/2019 Document Reviewed: 01/27/2019 Elsevier Patient Education  [place].

## 2020-04-01 NOTE — Progress Notes (Signed)
Subjective:  Patient ID: Rhonda Roberts, female    DOB: [birth year]  Age: 65 y.o. 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  Discharge diagnoses of status post CABG x4, acute respiratory failure, NSTEMI, acute systolic heart failure, CAD involving native coronary artery of native as well as above mentioned issues.  She has followed up with cardiology on 03/26/2020.         She reports that she is participating in home physical therapy and feels that she is gradually improving.  She does have some fatigue but this is much better than during her hospitalization.  She continues to have some mild shortness of breath with exertion.  She denies any issues with increased lower extremity swelling.  She is aware of signs and symptoms of increased fluid buildup related to her congestive heart failure.  She does weigh herself on a daily basis.  She continues to have some pain at the site of her surgery, mainly over the middle of the chest.  Pain is worse with movements of her upper extremity.  She reports that she was provided with a walker by physical therapy which she does not believe that she needs but she states that a physical therapist actually told her that the walker is to help her not walk as quickly to help prevent falls/injury.  Patient states that she is used to doing things for herself.  She reports that she has had diabetes for  many years but after losing her insurance, she was not able to continue to follow-up with her doctor for monitoring of her blood sugars.  She does check her blood sugars at home and states that morning blood sugars, fasting are generally in the 140s or less and are sometimes slightly higher later in the day/evening.  She is aware of signs and symptoms of both high blood sugars and low blood sugars.  She reports no recent issues with blood sugars either being too high or too low.  She denies any current urinary frequency other than with the use of her fluid pill and she denies increased thirst at this time.  She is taking all of her medications and denies headaches or dizziness related to her blood pressure. She is taking all of her medications and she recently saw her cardiologist and was told that she was doing well.   Past Medical History:  Diagnosis Date  . Diabetes mellitus without complication ([place])   . GERD (gastroesophageal reflux disease)   . Headache   . Hypertension     Past Surgical History:  Procedure Laterality Date  . ABDOMINAL HYSTERECTOMY    . CESAREAN SECTION  1987  . COLONOSCOPY WITH PROPOFOL N/A 05/08/2016   Procedure: COLONOSCOPY WITH PROPOFOL;  Surgeon: Carol Ada, MD;  Location: [facility];  Service: Endoscopy;  Laterality: N/A;  . CORONARY ARTERY BYPASS GRAFT N/A 03/06/2020   Procedure: CORONARY ARTERY BYPASS GRAFTING (CABG) using LIMA to LAD, RIMA to PLB, Left Radial Artery Graft: RAG to [place]; RAG to [place].;  Surgeon: Wonda Olds, MD;  Location: [facility] OR;  Service: Open Heart Surgery;  Laterality: N/A;  BILATERAL IMA  . LEFT HEART CATH AND CORONARY ANGIOGRAPHY N/A 03/04/2020   Procedure: LEFT HEART CATH AND CORONARY ANGIOGRAPHY;  Surgeon: Belva Crome, MD;  Location: [place] [facility];  Service: Cardiovascular;  Laterality: N/A;  . RADIAL ARTERY HARVEST Left 03/06/2020   Procedure: RADIAL ARTERY HARVEST;  Surgeon: Wonda Olds, MD;  Location: [place];  Service:  Open Heart Surgery;  Laterality: Left;  . TEE WITHOUT CARDIOVERSION N/A 03/06/2020   Procedure: TRANSESOPHAGEAL ECHOCARDIOGRAM (TEE);  Surgeon: Wonda Olds, MD;  Location: [place];  Service: Open Heart Surgery;  Laterality: N/A;      Social History   Tobacco Use  . Smoking status: Former Smoker    Quit date: 11/23/2004    Years since quitting: 15.4  . Smokeless tobacco: Never Used  Substance Use Topics  . Alcohol use: No    ROS Review of Systems  Constitutional: Positive for fatigue (Improving status post hospitalization). Negative for chills and fever.  HENT: Negative for sore throat and trouble swallowing.   Eyes: Negative for photophobia and visual disturbance.  Respiratory: Positive for shortness of breath (Mild shortness of breath with exertion). Negative for cough.   Cardiovascular: Negative for chest pain and palpitations.       Pain is over the sternal area/postsurgical site, no left-sided chest pain  Gastrointestinal: Negative for abdominal pain, constipation, diarrhea and nausea.  Endocrine: Negative for polydipsia, polyphagia and polyuria.  Genitourinary: Positive for frequency (Sometimes a fluid pill). Negative for dysuria.  Musculoskeletal: Negative for arthralgias and back pain.  Skin: Positive for wound (Healing sternal postsurgical scar). Negative for rash.  Neurological: Negative for dizziness and headaches.  Hematological: Negative for adenopathy. Does not bruise/bleed easily.    Objective:   Today's Vitals: BP 126/80   Pulse 85   Temp 97.9 F (36.6 C) (Temporal)   Ht 5\' 2"  (1.575 m)   Wt 293 lb 9.6 oz (133.2 kg)   SpO2 99%   BMI 53.70 kg/m   Physical Exam Vitals and nursing note reviewed.  Constitutional:      General: She is not in acute distress.    Appearance: Normal appearance. She is obese. She is not ill-appearing.     Comments: Well-nourished well-developed obese older female in no acute distress.  Patient is seated on exam chair and has  a nearby walker.  She reports that she does not really need to use a walker however this was provided by physical therapy to actually make her walk slower.  Neck:     Vascular: No carotid bruit.     Comments: No JVD Cardiovascular:     Rate and Rhythm: Normal rate and regular rhythm.     Pulses:          Dorsalis pedis pulses are 1+ on the right side and 1+ on the left side.       Posterior tibial pulses are 1+ on the right side and 1+ on the left side.  Pulmonary:     Effort: Pulmonary effort is normal.     Breath sounds: Normal breath sounds.  Abdominal:     Palpations: Abdomen is soft.     Tenderness: There is no abdominal tenderness. There is no right CVA tenderness, left CVA tenderness, guarding or rebound.  Musculoskeletal:        General: Tenderness present.     Cervical back: Normal range of motion and neck supple. No  tenderness.     Right lower leg: No edema.     Left lower leg: No edema.     Comments: No appreciable lower extremity edema on exam; patient with discomfort with upper extremity movements that cause stretching of the sternum.   Feet:     Right foot:     Skin integrity: Skin integrity normal.     Left foot:     Skin integrity: Skin integrity normal.  Lymphadenopathy:     Cervical: No cervical adenopathy.  Skin:    General: Skin is warm and dry.     Comments: Healing postsurgical mid chest scar over the sternal area.  No inflammation/erythema or drainage noted  Neurological:     General: No focal deficit present.     Mental Status: She is alert and oriented to person, place, and time.  Psychiatric:        Mood and Affect: Mood normal.        Behavior: Behavior normal.     Assessment & Plan:  1. Coronary artery disease involving native coronary artery of native heart with angina pectoris [facility]) She is status post hospitalization from 03/01/2020 through 03/15/2020 at [facility] after presenting to the emergency department at [facility] with chest pain  which have been occurring off and on for more than a week in addition to nausea vomiting and diarrhea.  Patient had improvement in her GI symptoms but continued to have chest pressure and presented to the emergency department and was found to have abnormal EKG and abnormal troponin levels at which time she was transported to [facility] for heart cath.  Multivessel CAD was found on heart cath which patient had four-vessel CABG.  She also had flash pulmonary edema after her heart cath which required a stay in the ICU.  At this time, patient is stable and reports no chest pain other than musculoskeletal sternal pain associated with CABG surgery.  She has had cardiology follow-up status post hospitalization and reports compliance with medications.  She will have comprehensive metabolic panel in follow-up of medications for treatment of her coronary artery disease including statin therapy and diuretic medications for CHF treatment - Comprehensive metabolic panel  2. S/P CABG x 4 Patient with sternal chest pain status post CABG and patient is given additional refill of oxycodone to take as needed for severe pain.  She is made aware that this can cause constipation and she should use stool softeners and over-the-counter laxatives as needed if she does more than 2 days without a bowel movement - oxyCODONE (OXY IR/ROXICODONE) 5 MG immediate release tablet; Take 1-2 tablets (5-10 mg total) by mouth every 6 (six) hours as needed for severe pain.  Dispense: 30 tablet; Refill: 0  3. Type 2 diabetes mellitus with hyperglycemia, unspecified whether long term insulin use (HCC) Hemoglobin A1c done on 03/02/2020 was elevated to 10.0.  Current hospital admission records, patient has been without insurance and therefore has had no recent medical coverage in order to follow-up with her primary care provider regarding her diabetes but had continued to take insulin.  There was some confusion as to patient's insulin regimen on  discussion with the patient.  Of the hospital discharge instructions.  Patient reports that she has been on insulin long-term and is aware of signs of hyper and hypoglycemia.  Per hospital discharge records she is to inject 80 units of NPH once daily but may increase to 80 units twice daily if her evening blood sugars are  over 150 per the hospital discharge records but she is advised to closely monitor her blood sugars, follow-up by low carbohydrate diet and be careful with administration of nighttime insulin and making sure that she does not skip meals.  She will have a comprehensive metabolic panel at today's visit. - Comprehensive metabolic panel  4. Pure hypercholesterolemia Patient is currently on atorvastatin and most recent lipid panel on 03/02/2020 showed cholesterol 216, HDL of 36, LDL of 159 and triglycerides 106.  Low-fat diet and continued exercise/physical activity encouraged.  5. Stage 3 chronic kidney disease, unspecified whether stage 3a or 3b CKD Patient with stage III chronic kidney disease with most recent creatinine of 1.52 on 03/14/2020 and patient will have repeat of electrolytes as part of comprehensive metabolic panel and CBC in follow-up of her chronic kidney disease.  She is aware of the need to make sure that her blood pressure remains controlled as well as the need to avoid nonsteroidal anti-inflammatories. - Comprehensive metabolic panel - CBC  6. Encounter for long-term current use of medication Comprehensive metabolic panel in follow-up of long-term use of statin medication as well as medication for treatment of CHF and diabetes - Comprehensive metabolic panel  7. History of atrial fibrillation less than 8 weeks after coronary artery bypass graft Patient with atrial fibrillation after recent hospitalization for coronary artery disease requiring CABG.  At today's visit, she appears to be a regular sinus rhythm.  On review of chart, she had EKG done on 02/10/2020 showing  normal sinus rhythm.  She is to remain on amiodarone per cardiology notes.  8. Chronic systolic heart failure (HCC) Stable. Patient has followed up with cardiology on 03/26/2020 s/p recent hospitalization. She reports that she is weighing herself on a daily basis. Most recent EF of 20% of 2021.  Continue spironolactone,  digoxin and carvedilol as per cardiology recommendations.  9. Post-operative nausea and vomiting Refill provided for Zofran to take as needed as patient still with some occasional nausea  10. Need for follow-up by social worker Will have social worker follow-up with patient regarding patient's Medicaid status and to see if their are any other resources from which the patient may benefit  Outpatient Encounter Medications as of 04/01/2020  Medication Sig  . aspirin 81 MG chewable tablet Chew 81 mg by mouth daily.  . Cholecalciferol (VITAMIN D) 2000 UNITS CAPS Take 2,000 Units by mouth every morning.   . Cyanocobalamin (VITAMIN B 12 PO) Take 2 tablets by mouth daily.   . insulin NPH Human (NOVOLIN N) 100 UNIT/ML injection Inject 80 Units into the skin daily. May do additional 80 units if evening blood sugar over 150  . Multiple Vitamin (MULTIVITAMIN WITH MINERALS) TABS Take 1 tablet by mouth every morning.  Marland Kitchen oxyCODONE (OXY IR/ROXICODONE) 5 MG immediate release tablet Take 1-2 tablets (5-10 mg total) by mouth every 6 (six) hours as needed for severe pain.  . sacubitril-valsartan (ENTRESTO) 49-51 MG Take 1 tablet by mouth 2 (two) times daily.  . [DISCONTINUED] amiodarone (PACERONE) 200 MG tablet Take 1 tablet (200 mg total) by mouth 2 (two) times daily. For 7 days, then decrease to 200 mg daily (Patient taking differently: Take 200 mg by mouth daily. )  . [DISCONTINUED] atorvastatin (LIPITOR) 80 MG tablet Take 1 tablet (80 mg total) by mouth daily at 6 PM.  . [DISCONTINUED] carvedilol (COREG) 3.125 MG tablet Take 1 tablet (3.125 mg total) by mouth 2 (two) times daily with a meal.  .  [DISCONTINUED] clopidogrel (  PLAVIX) 75 MG tablet Take 1 tablet (75 mg total) by mouth daily.  . [DISCONTINUED] digoxin (LANOXIN) 0.125 MG tablet Take 1 tablet (0.125 mg total) by mouth daily.  . [DISCONTINUED] furosemide (LASIX) 80 MG tablet Take 1 tablet (80 mg total) by mouth daily.  . [DISCONTINUED] isosorbide dinitrate (ISORDIL) 10 MG tablet Take 1 tablet (10 mg total) by mouth 3 (three) times daily.  . [DISCONTINUED] ondansetron (ZOFRAN) 4 MG tablet Take 1 tablet (4 mg total) by mouth every 8 (eight) hours as needed for up to 20 doses for nausea or vomiting.  . [DISCONTINUED] ondansetron (ZOFRAN) 4 MG tablet Take 1 tablet (4 mg total) by mouth every 8 (eight) hours as needed for up to 20 doses for nausea or vomiting.  . [DISCONTINUED] oxyCODONE (OXY IR/ROXICODONE) 5 MG immediate release tablet Take 1-2 tablets (5-10 mg total) by mouth every 4 (four) hours as needed for severe pain.  . [DISCONTINUED] spironolactone (ALDACTONE) 25 MG tablet Take 1 tablet (25 mg total) by mouth daily.   No facility-administered encounter medications on file as of 04/01/2020.    An After Visit Summary was printed and given to the patient.   Follow-up: Return in about 6 weeks (around 05/13/2020) for chronic issues- sooner if needed.   Greater than 30 minutes of face to face time was spent with the patient at today's visit as well as an additional 18 minutes for chart review and creation of today's office note  Devita Nies MD

## 2020-04-01 NOTE — Progress Notes (Signed)
HFU 

## 2020-04-02 ENCOUNTER — Ambulatory Visit: Payer: Self-pay | Attending: Family Medicine

## 2020-04-02 ENCOUNTER — Telehealth: Payer: Self-pay | Admitting: *Deleted

## 2020-04-02 NOTE — Telephone Encounter (Signed)
Opened in Error.

## 2020-04-03 LAB — COMPREHENSIVE METABOLIC PANEL WITH GFR
ALT: 31 IU/L (ref 0–32)
AST: 31 IU/L (ref 0–40)
Albumin/Globulin Ratio: 1.1 — ABNORMAL LOW (ref 1.2–2.2)
Albumin: 3.6 g/dL — ABNORMAL LOW (ref 3.8–4.8)
Alkaline Phosphatase: 168 IU/L — ABNORMAL HIGH (ref 39–117)
BUN/Creatinine Ratio: 15 (ref 12–28)
BUN: 22 mg/dL (ref 8–27)
Bilirubin Total: 0.4 mg/dL (ref 0.0–1.2)
CO2: 25 mmol/L (ref 20–29)
Calcium: 9.3 mg/dL (ref 8.7–10.3)
Chloride: 96 mmol/L (ref 96–106)
Creatinine, Ser: 1.47 mg/dL — ABNORMAL HIGH (ref 0.57–1.00)
GFR calc Af Amer: 43 mL/min/1.73 — ABNORMAL LOW
GFR calc non Af Amer: 37 mL/min/1.73 — ABNORMAL LOW
Globulin, Total: 3.4 g/dL (ref 1.5–4.5)
Glucose: 262 mg/dL — ABNORMAL HIGH (ref 65–99)
Potassium: 4.9 mmol/L (ref 3.5–5.2)
Sodium: 135 mmol/L (ref 134–144)
Total Protein: 7 g/dL (ref 6.0–8.5)

## 2020-04-03 LAB — CBC
Hematocrit: 33 % — ABNORMAL LOW (ref 34.0–46.6)
Hemoglobin: 10.7 g/dL — ABNORMAL LOW (ref 11.1–15.9)
MCH: 28.2 pg (ref 26.6–33.0)
MCHC: 32.4 g/dL (ref 31.5–35.7)
MCV: 87 fL (ref 79–97)
Platelets: 370 x10E3/uL (ref 150–450)
RBC: 3.8 x10E6/uL (ref 3.77–5.28)
RDW: 14.5 % (ref 11.7–15.4)
WBC: 8 x10E3/uL (ref 3.4–10.8)

## 2020-04-05 ENCOUNTER — Other Ambulatory Visit: Payer: Self-pay | Admitting: Cardiothoracic Surgery

## 2020-04-05 DIAGNOSIS — Z951 Presence of aortocoronary bypass graft: Secondary | ICD-10-CM

## 2020-04-08 ENCOUNTER — Other Ambulatory Visit: Payer: Self-pay

## 2020-04-08 ENCOUNTER — Ambulatory Visit
Admission: RE | Admit: 2020-04-08 | Discharge: 2020-04-08 | Disposition: A | Discharge status: Home / Self Care | Payer: No Typology Code available for payment source | Source: Ambulatory Visit | Attending: Cardiothoracic Surgery | Admitting: Cardiothoracic Surgery

## 2020-04-08 ENCOUNTER — Ambulatory Visit (INDEPENDENT_AMBULATORY_CARE_PROVIDER_SITE_OTHER): Payer: Self-pay | Admitting: Cardiothoracic Surgery

## 2020-04-08 VITALS — BP 140/78 | HR 83 | Temp 97.6°F | Resp 20 | Ht 62.0 in | Wt 290.0 lb

## 2020-04-08 DIAGNOSIS — I2510 Atherosclerotic heart disease of native coronary artery without angina pectoris: Secondary | ICD-10-CM

## 2020-04-08 DIAGNOSIS — Z951 Presence of aortocoronary bypass graft: Secondary | ICD-10-CM

## 2020-04-08 NOTE — Progress Notes (Signed)
PCP: None  Primary HF Cardiologist: Dr Aundra Dubin  CT Surgeon: Dr Orvan Seen   HPI: [name] is a 64 year old with history of DMII, HTN, dyslipidemia, obesity, GERD, diverticulosis, and arthritis.   Presented to [facility] ED on 03/01/2020 with N/V and intermittent chest pain. EKG with ST depression inferior and anterolatera leads. HS Trop [phone]. LHC today as noted below with 3 vessel disease, low EF, and elevated LVEPD. Post cath she was acutely short of breath. Placed on bipap, nitro drip at 10 mcg, and diuresed with IV lasix. CT surgery consulted. Once tuned up she underwent CABGx4. Gradually drips stopped. HF meds started. Discharged to home with her daughter. Discharged to home 03/15/20.  Today she returns for HF follow up. Last visit entresto was increased.  Earlier this week she saw Dr Orvan Seen and amio, lasix , and isordil stopped. Overall feeling ok. Main complaint has been intermittent nausea. Rarely short of breath. Denies PND/Orthopnea.Walking 3 times a day in her house. Using a walker. Appetite ok. No fever or chills. Weight at home 289-292  pounds. Taking all medications. Lives with daughter.   ECHO 03/04/20: EF 20-25%Grade II DD   LHC 03/04/20  LVEPD 36   99% LAD  80% mid circumflex, 70% ostial circumflex, 99% small ramus. 95% mid LAD  Mid 99% PDA   LVEDP 38 mmHg. EF less than 30%. Global hypokinesis. Anterior wall worsened over walls. ROS: All systems negative except as listed in HPI, PMH and Problem List.  SH:  Social History   Socioeconomic History  . Marital status: Single    Spouse name: Not on file  . Number of children: Not on file  . Years of education: Not on file  . Highest education level: Not on file  Occupational History  . Occupation: retired  Tobacco Use  . Smoking status: Former Smoker    Quit date: 11/23/2004    Years since quitting: 15.3  . Smokeless tobacco: Never Used  Substance and Sexual Activity  . Alcohol use: No  . Drug use: No  . Sexual  activity: Not on file  Other Topics Concern  . Not on file  Social History Narrative  . Not on file   Social Determinants of Health   Financial Resource Strain:   . Difficulty of Paying Living Expenses:   Food Insecurity:   . Worried About Charity fundraiser in the Last Year:   . Arboriculturist in the Last Year:   Transportation Needs:   . Film/video editor (Medical):   Marland Kitchen Lack of Transportation (Non-Medical):   Physical Activity:   . Days of Exercise per Week:   . Minutes of Exercise per Session:   Stress:   . Feeling of Stress :   Social Connections:   . Frequency of Communication with Friends and Family:   . Frequency of Social Gatherings with Friends and Family:   . Attends Religious Services:   . Active Member of Clubs or Organizations:   . Attends Archivist Meetings:   Marland Kitchen Marital Status:   Intimate Partner Violence:   . Fear of Current or Ex-Partner:   . Emotionally Abused:   Marland Kitchen Physically Abused:   . Sexually Abused:     FH: No family history on file.  Past Medical History:  Diagnosis Date  . Diabetes mellitus without complication ([place])   . GERD (gastroesophageal reflux disease)   . Headache   . Hypertension     Current Outpatient Medications  Medication Sig Dispense Refill  . aspirin 81 MG chewable tablet Chew 81 mg by mouth daily.    Marland Kitchen atorvastatin (LIPITOR) 80 MG tablet Take 1 tablet (80 mg total) by mouth daily at 6 PM. 30 tablet 3  . carvedilol (COREG) 3.125 MG tablet Take 1 tablet (3.125 mg total) by mouth 2 (two) times daily with a meal. 60 tablet 2  . Cholecalciferol (VITAMIN D) 2000 UNITS CAPS Take 2,000 Units by mouth every morning.     . clopidogrel (PLAVIX) 75 MG tablet Take 1 tablet (75 mg total) by mouth daily. 30 tablet 3  . Cyanocobalamin (VITAMIN B 12 PO) Take 2 tablets by mouth daily.     . digoxin (LANOXIN) 0.125 MG tablet Take 1 tablet (0.125 mg total) by mouth daily. 30 tablet 3  . insulin NPH Human (NOVOLIN N) 100  UNIT/ML injection Inject 80 Units into the skin daily. May do additional 80 units if evening blood sugar over 150    . Multiple Vitamin (MULTIVITAMIN WITH MINERALS) TABS Take 1 tablet by mouth every morning.    . ondansetron (ZOFRAN) 4 MG tablet Take 1 tablet (4 mg total) by mouth every 8 (eight) hours as needed for up to 20 doses for nausea or vomiting. 20 tablet 0  . oxyCODONE (OXY IR/ROXICODONE) 5 MG immediate release tablet Take 1-2 tablets (5-10 mg total) by mouth every 6 (six) hours as needed for severe pain. 30 tablet 0  . sacubitril-valsartan (ENTRESTO) 49-51 MG Take 1 tablet by mouth 2 (two) times daily. 60 tablet 6  . spironolactone (ALDACTONE) 25 MG tablet Take 1 tablet (25 mg total) by mouth daily. 30 tablet 3   No current facility-administered medications for this encounter.    Vitals:   04/09/20 1117  BP: 138/78  Pulse: 90  SpO2: 99%  Weight: 132.5 kg (292 lb)   Wt Readings from Last 3 Encounters:  04/09/20 132.5 kg (292 lb)  04/08/20 131.5 kg (290 lb)  04/01/20 133.2 kg (293 lb 9.6 oz)     PHYSICAL EXAM: General:  Well appearing. No resp difficulty HEENT: normal Neck: supple. no JVD. Carotids 2+ bilat; no bruits. No lymphadenopathy or thryomegaly appreciated. Cor: PMI nondisplaced. Regular rate & rhythm. No rubs, gallops or murmurs. Sternal scar intact  Lungs: clear Abdomen: Obese, soft, nontender, nondistended. No hepatosplenomegaly. No bruits or masses. Good bowel sounds. Extremities: no cyanosis, clubbing, rash, edema. LUE scar  Neuro: alert & orientedx3, cranial nerves grossly intact. moves all 4 extremities w/o difficulty. Affect pleasant  ECG: NSR 82 bpm PR 202 ms   ASSESSMENT & PLAN: 1. CAD:  LHC with 3VD, now s/p CABG x4 with LIMA-LAD, seq radial-D/OM, RIMA-PDA.  No chest pain. - Continue ASA, Plavix - Off isordil - On statin.  2. Chronic Systolic CHF: Ischemic cardiomyopathy.  Echo pre-op with 03/04/20 EF 30%, normal RV, Grade IDD. NYHA II.  Volume  status stable. Continue lasix as needed.  - Continue entresto 49-51 mg twice a day.  - continue spironolactone 25 qd - continue digoxin 0.125, check dig level.  -Continue coreg 3.125 mg twice a day.  - Plan to repeat ECHO the end of July.   - Check BMET today.   3. DMII: 02/2020 Hgb A1C 10  -   On insulin  - Continue statin - Has follow up with PCP   4. . Anemia: -No bleeding issues.   5. CKD: Stage IIIa.  Creatinine stable 1.52.  - Check BMET   6. HLD: Atorvastatin  80 daily. Having some leg pain. Watch closely.  Plan to check lipids in July if LDL remains >70 will need PCSK9i if LDL not at goal when checked.   7. Deconditioning - Completed HHPT - Start cardiac rehab soon.    Follow up in 4 weeks with APP and 8-10 weeks with Dr Aundra Dubin and an ECHO.    Kiffany Schelling NP-C  11:20 AM

## 2020-04-08 NOTE — Progress Notes (Signed)
[address]       [place] [address]             [phone]     CARDIOTHORACIC SURGERY OFFICE NOTE  Referring Provider is Skeet Latch, MD Primary Cardiologist is Skeet Latch, MD PCP is Antony Blackbird, MD   HPI: 64 yo lady s/p CABG presents for 2nd post-operative visit. No complaints of chest pain or shortness of breath. Denies edema; has been increasing exercise capacity.   Current Outpatient Medications  Medication Sig Dispense Refill  . aspirin 81 MG chewable tablet Chew 81 mg by mouth daily.    Marland Kitchen atorvastatin (LIPITOR) 80 MG tablet Take 1 tablet (80 mg total) by mouth daily at 6 PM. 30 tablet 3  . carvedilol (COREG) 3.125 MG tablet Take 1 tablet (3.125 mg total) by mouth 2 (two) times daily with a meal. 60 tablet 2  . Cholecalciferol (VITAMIN D) 2000 UNITS CAPS Take 2,000 Units by mouth every morning.     . clopidogrel (PLAVIX) 75 MG tablet Take 1 tablet (75 mg total) by mouth daily. 30 tablet 3  . Cyanocobalamin (VITAMIN B 12 PO) Take 2 tablets by mouth daily.     . digoxin (LANOXIN) 0.125 MG tablet Take 1 tablet (0.125 mg total) by mouth daily. 30 tablet 3  . insulin NPH Human (NOVOLIN N) 100 UNIT/ML injection Inject 80 Units into the skin daily. May do additional 80 units if evening blood sugar over 150    . Multiple Vitamin (MULTIVITAMIN WITH MINERALS) TABS Take 1 tablet by mouth every morning.    . ondansetron (ZOFRAN) 4 MG tablet Take 1 tablet (4 mg total) by mouth every 8 (eight) hours as needed for up to 20 doses for nausea or vomiting. 20 tablet 0  . oxyCODONE (OXY IR/ROXICODONE) 5 MG immediate release tablet Take 1-2 tablets (5-10 mg total) by mouth every 6 (six) hours as needed for severe pain. 30 tablet 0  . sacubitril-valsartan (ENTRESTO) 49-51 MG Take 1 tablet by mouth 2 (two) times daily. 60 tablet 6  . spironolactone (ALDACTONE) 25 MG tablet Take 1 tablet (25 mg total) by mouth daily. 30 tablet 3   No current  facility-administered medications for this visit.      Physical Exam:   BP 140/78   Pulse 83   Temp 97.6 F (36.4 C) (Skin)   Resp 20   Ht 5\' 2"  (1.575 m)   Wt 131.5 kg   SpO2 96% Comment: RA  BMI 53.04 kg/m   General:  Well-appearing, NAD  Chest:   cta  CV:   rrr  Incisions:  Well-healed  Extremities:  No edema  Diagnostic Tests:  CXR with clear lung fields   Impression: Doing very well after CABG  Plan:  F/u as needed with thoracic surgery Ok to drive car Ok to liberalize activity to include lifting and light house work   I spent in excess of 20 minutes during the conduct of this office consultation and >50% of this time involved direct face-to-face encounter with the patient for counseling and/or coordination of their care.  Level 2                 10 minutes Level 3                 15 minutes Level 4                 25 minutes Level 5  40 minutes  B. Murvin Natal, MD 04/08/2020 3:04 PM

## 2020-04-09 ENCOUNTER — Ambulatory Visit (HOSPITAL_COMMUNITY)
Admission: RE | Admit: 2020-04-09 | Discharge: 2020-04-09 | Disposition: A | Discharge status: Home / Self Care | Payer: PRIVATE HEALTH INSURANCE | Source: Ambulatory Visit | Attending: Internal Medicine | Admitting: Internal Medicine

## 2020-04-09 ENCOUNTER — Encounter (HOSPITAL_COMMUNITY): Payer: Self-pay

## 2020-04-09 VITALS — BP 138/78 | HR 90 | Wt 292.0 lb

## 2020-04-09 DIAGNOSIS — E1122 Type 2 diabetes mellitus with diabetic chronic kidney disease: Secondary | ICD-10-CM | POA: Insufficient documentation

## 2020-04-09 DIAGNOSIS — E785 Hyperlipidemia, unspecified: Secondary | ICD-10-CM | POA: Insufficient documentation

## 2020-04-09 DIAGNOSIS — R5381 Other malaise: Secondary | ICD-10-CM | POA: Diagnosis not present

## 2020-04-09 DIAGNOSIS — D649 Anemia, unspecified: Secondary | ICD-10-CM | POA: Insufficient documentation

## 2020-04-09 DIAGNOSIS — Z951 Presence of aortocoronary bypass graft: Secondary | ICD-10-CM | POA: Diagnosis not present

## 2020-04-09 DIAGNOSIS — Z7902 Long term (current) use of antithrombotics/antiplatelets: Secondary | ICD-10-CM | POA: Insufficient documentation

## 2020-04-09 DIAGNOSIS — N1831 Chronic kidney disease, stage 3a: Secondary | ICD-10-CM | POA: Diagnosis not present

## 2020-04-09 DIAGNOSIS — E669 Obesity, unspecified: Secondary | ICD-10-CM | POA: Diagnosis not present

## 2020-04-09 DIAGNOSIS — Z87891 Personal history of nicotine dependence: Secondary | ICD-10-CM | POA: Insufficient documentation

## 2020-04-09 DIAGNOSIS — I2510 Atherosclerotic heart disease of native coronary artery without angina pectoris: Secondary | ICD-10-CM | POA: Diagnosis not present

## 2020-04-09 DIAGNOSIS — M79606 Pain in leg, unspecified: Secondary | ICD-10-CM | POA: Insufficient documentation

## 2020-04-09 DIAGNOSIS — Z794 Long term (current) use of insulin: Secondary | ICD-10-CM | POA: Diagnosis not present

## 2020-04-09 DIAGNOSIS — Z79899 Other long term (current) drug therapy: Secondary | ICD-10-CM | POA: Insufficient documentation

## 2020-04-09 DIAGNOSIS — Z7982 Long term (current) use of aspirin: Secondary | ICD-10-CM | POA: Insufficient documentation

## 2020-04-09 DIAGNOSIS — I5022 Chronic systolic (congestive) heart failure: Secondary | ICD-10-CM | POA: Diagnosis not present

## 2020-04-09 DIAGNOSIS — I130 Hypertensive heart and chronic kidney disease with heart failure and stage 1 through stage 4 chronic kidney disease, or unspecified chronic kidney disease: Secondary | ICD-10-CM | POA: Diagnosis not present

## 2020-04-09 LAB — BASIC METABOLIC PANEL
Anion gap: 11 (ref 5–15)
BUN: 20 mg/dL (ref 8–23)
CO2: 29 mmol/L (ref 22–32)
Calcium: 9.4 mg/dL (ref 8.9–10.3)
Chloride: 97 mmol/L — ABNORMAL LOW (ref 98–111)
Creatinine, Ser: 1.54 mg/dL — ABNORMAL HIGH (ref 0.44–1.00)
GFR calc Af Amer: 41 mL/min — ABNORMAL LOW (ref >60)
GFR calc non Af Amer: 35 mL/min — ABNORMAL LOW (ref >60)
Glucose, Bld: 266 mg/dL — ABNORMAL HIGH (ref 70–99)
Potassium: 4.5 mmol/L (ref 3.5–5.1)
Sodium: 137 mmol/L (ref 135–145)

## 2020-04-09 LAB — DIGOXIN LEVEL: Digoxin Level: 1.2 ng/mL (ref 0.8–2.0)

## 2020-04-09 NOTE — Patient Instructions (Signed)
No medication changes today!  Labs today We will only contact you if something comes back abnormal or we need to make some changes. Otherwise no news is good news!   Your physician has requested that you have an echocardiogram. Echocardiography is a painless test that uses sound waves to create images of your heart. It provides your doctor with information about the size and shape of your heart and how well your heart's chambers and valves are working. This procedure takes approximately one hour. There are no restrictions for this procedure.   Your physician recommends that you schedule a follow-up appointment in: 4 weeks with the Nurse Practitioner   Your physician recommends that you schedule a follow-up appointment in: 10 weeks with Dr Aundra Dubin  Please call office at [phone] option 2 if you have any questions or concerns.   At the [facility], you and your health needs are our priority. As part of our continuing mission to provide you with exceptional heart care, we have created designated Provider Care Teams. These Care Teams include your primary Cardiologist (physician) and Advanced Practice Providers (APPs- Physician Assistants and Nurse Practitioners) who all work together to provide you with the care you need, when you need it.   You may see any of the following providers on your designated Care Team at your next follow up: Marland Kitchen Dr Glori Bickers . Dr Loralie Champagne . Darrick Grinder, NP . Lyda Jester, PA . Audry Riles, PharmD   Please be sure to bring in all your medications bottles to every appointment.

## 2020-04-09 NOTE — Telephone Encounter (Signed)
Pt has appt today and will address B/P at that time was unable to reach pt, but spoke with pt's sister Hattie./cy

## 2020-04-10 ENCOUNTER — Telehealth (HOSPITAL_COMMUNITY): Payer: Self-pay | Admitting: Cardiology

## 2020-04-10 NOTE — Telephone Encounter (Signed)
Pt aware and voiced understanding 

## 2020-04-10 NOTE — Telephone Encounter (Signed)
-----   Message from Conrad [place], NP sent at 04/09/2020  5:17 PM EDT ----- Please call. Stop digoxin

## 2020-04-11 ENCOUNTER — Other Ambulatory Visit (HOSPITAL_COMMUNITY): Payer: Self-pay | Admitting: Adult Health

## 2020-04-11 ENCOUNTER — Telehealth (HOSPITAL_COMMUNITY): Payer: Self-pay | Admitting: Licensed Clinical Social Worker

## 2020-04-11 ENCOUNTER — Other Ambulatory Visit (HOSPITAL_COMMUNITY): Payer: Self-pay

## 2020-04-11 MED ORDER — SPIRONOLACTONE 25 MG PO TABS: 25.0000 mg | ORAL_TABLET | Freq: Every day | ORAL | 6 refills | Status: DC

## 2020-04-11 MED ORDER — ATORVASTATIN CALCIUM 80 MG PO TABS: 80.0000 mg | ORAL_TABLET | Freq: Every day | ORAL | 6 refills | Status: DC

## 2020-04-11 MED ORDER — CLOPIDOGREL BISULFATE 75 MG PO TABS: 75.0000 mg | ORAL_TABLET | Freq: Every day | ORAL | 6 refills | Status: DC

## 2020-04-11 MED ORDER — CARVEDILOL 3.125 MG PO TABS: 3.1250 mg | ORAL_TABLET | Freq: Two times a day (BID) | ORAL | 6 refills | Status: DC

## 2020-04-11 MED FILL — ATORVASTATIN 80 MG TABLET: 80 | 30 days supply | Qty: 30 | Fill #0

## 2020-04-11 MED FILL — CLOPIDOGREL 75 MG TABLET: 75 | 30 days supply | Qty: 30 | Fill #0

## 2020-04-11 MED FILL — SPIRONOLACTONE 25 MG TABS: 25 | 30 days supply | Qty: 30 | Fill #0

## 2020-04-11 MED FILL — CARVEDILOL 3.125 MG TABLET: 3.125 | 30 days supply | Qty: 60 | Fill #0

## 2020-04-11 NOTE — Telephone Encounter (Signed)
CSW received call from patient stating she needs refills on her medications. Patient states she can't afford and listed the 4 meds needing refills. CSW arranged with Clinic RN to send refills into the [facility] and notified patient of pharmacy location and pick up time. Patient grateful for the assistance. CSW available as needed. Raquel Sarna, [place], [place]

## 2020-04-13 ENCOUNTER — Other Ambulatory Visit: Payer: Self-pay | Admitting: Family Medicine

## 2020-04-13 DIAGNOSIS — R112 Nausea with vomiting, unspecified: Secondary | ICD-10-CM

## 2020-04-15 ENCOUNTER — Telehealth (HOSPITAL_COMMUNITY): Payer: Self-pay | Admitting: *Deleted

## 2020-04-15 ENCOUNTER — Encounter: Payer: Self-pay | Admitting: Family Medicine

## 2020-04-15 ENCOUNTER — Ambulatory Visit: Payer: No Typology Code available for payment source | Admitting: Family Medicine

## 2020-04-15 DIAGNOSIS — E7800 Pure hypercholesterolemia, unspecified: Secondary | ICD-10-CM

## 2020-04-15 NOTE — Telephone Encounter (Signed)
Pt would like atorvastatin changed to another medication. Pt said it is causing a spike in her blood sugar and pain in legs.   Routed to [facility] for advice

## 2020-04-16 NOTE — Telephone Encounter (Signed)
[phone] (M) Pt aware and voiced understanding

## 2020-04-18 ENCOUNTER — Ambulatory Visit: Payer: Medicaid Other | Admitting: Nurse Practitioner

## 2020-04-23 ENCOUNTER — Telehealth: Payer: Self-pay

## 2020-04-23 NOTE — Telephone Encounter (Signed)
1) Medication(s) Requested (by name): oxycodone  2) Pharmacy of Choice: [facility] on [address]  3) Special Requests:  Pt ph [phone]   Approved medications will be sent to the pharmacy, we will reach out if there is an issue.  Requests made after 3pm may not be addressed until the following business day!  If a patient is unsure of the name of the medication(s) please note and ask patient to call back when they are able to provide all info, do not send to responsible party until all information is available!

## 2020-04-24 NOTE — Telephone Encounter (Signed)
Per chart review, this was not supposed to be a long-term prescription. Patient can discuss with provider at her appointment on 04/29/2020.

## 2020-04-29 ENCOUNTER — Ambulatory Visit (INDEPENDENT_AMBULATORY_CARE_PROVIDER_SITE_OTHER): Payer: PRIVATE HEALTH INSURANCE | Admitting: Pharmacist

## 2020-04-29 ENCOUNTER — Telehealth: Payer: Self-pay | Admitting: Pharmacist

## 2020-04-29 ENCOUNTER — Ambulatory Visit (INDEPENDENT_AMBULATORY_CARE_PROVIDER_SITE_OTHER): Payer: PRIVATE HEALTH INSURANCE | Admitting: Nurse Practitioner

## 2020-04-29 ENCOUNTER — Encounter: Payer: Self-pay | Admitting: Nurse Practitioner

## 2020-04-29 ENCOUNTER — Other Ambulatory Visit: Payer: Self-pay

## 2020-04-29 VITALS — BP 148/74 | HR 93 | Temp 97.9°F | Ht 62.0 in | Wt 281.0 lb

## 2020-04-29 DIAGNOSIS — Z794 Long term (current) use of insulin: Secondary | ICD-10-CM

## 2020-04-29 DIAGNOSIS — Z6841 Body Mass Index (BMI) 40.0 and over, adult: Secondary | ICD-10-CM

## 2020-04-29 DIAGNOSIS — I5021 Acute systolic (congestive) heart failure: Secondary | ICD-10-CM

## 2020-04-29 DIAGNOSIS — I10 Essential (primary) hypertension: Secondary | ICD-10-CM

## 2020-04-29 DIAGNOSIS — E1165 Type 2 diabetes mellitus with hyperglycemia: Secondary | ICD-10-CM

## 2020-04-29 DIAGNOSIS — Z951 Presence of aortocoronary bypass graft: Secondary | ICD-10-CM

## 2020-04-29 DIAGNOSIS — I25119 Atherosclerotic heart disease of native coronary artery with unspecified angina pectoris: Secondary | ICD-10-CM

## 2020-04-29 DIAGNOSIS — E7800 Pure hypercholesterolemia, unspecified: Secondary | ICD-10-CM

## 2020-04-29 DIAGNOSIS — E782 Mixed hyperlipidemia: Secondary | ICD-10-CM

## 2020-04-29 LAB — POCT GLYCOSYLATED HEMOGLOBIN (HGB A1C)
HbA1c POC (<> result, manual entry): 8.4 % (ref 4.0–5.6)
HbA1c, POC (controlled diabetic range): 8.4 % — AB (ref 0.0–7.0)
HbA1c, POC (prediabetic range): 8.4 % — AB (ref 5.7–6.4)
Hemoglobin A1C: 8.4 % — AB (ref 4.0–5.6)

## 2020-04-29 LAB — POCT URINALYSIS DIPSTICK
Bilirubin, UA: NEGATIVE
Glucose, UA: NEGATIVE
Ketones, UA: NEGATIVE
Nitrite, UA: NEGATIVE
Protein, UA: POSITIVE — AB
Spec Grav, UA: 1.02 (ref 1.010–1.025)
Urobilinogen, UA: 0.2 E.U./dL
pH, UA: 5.5 (ref 5.0–8.0)

## 2020-04-29 LAB — GLUCOSE, POCT (MANUAL RESULT ENTRY): POC Glucose: 229 mg/dl — AB (ref 70–99)

## 2020-04-29 MED ORDER — OXYCODONE HCL 5 MG PO TABS: 5.0000 mg | ORAL_TABLET | Freq: Four times a day (QID) | ORAL | 0 refills | Status: DC | PRN

## 2020-04-29 MED ORDER — SITAGLIPTIN PHOSPHATE 25 MG PO TABS
25.0000 mg | ORAL_TABLET | Freq: Every day | ORAL | 2 refills | Status: DC
Start: 2020-04-29 — End: 2020-06-17

## 2020-04-29 MED ORDER — ROSUVASTATIN CALCIUM 20 MG PO TABS
20.0000 mg | ORAL_TABLET | Freq: Every day | ORAL | 3 refills | Status: DC
Start: 2020-04-29 — End: 2020-06-17

## 2020-04-29 NOTE — Patient Instructions (Addendum)
It was a pleasure meeting you today.  Stop taking atorvastatin Start taking rosuvastatin (Crestor) 20mg  daily  We will check your cholesterol levels again in about 1 month.  We may have to start you on another medication to lower your cholesterol to your goal of <55. Your LDL cholesterol was 159 in April 2021. The medications we may start next would be ezetimibe (Zetia) or Praluent or Repatha.  Please call us if you have any questions. [phone]

## 2020-04-29 NOTE — Progress Notes (Addendum)
[place], [place]  [address] Phone:  [phone]   Fax:  [phone]   New Patient Office Visit  Subjective:  Patient ID: Rhonda Roberts, female    DOB: 20-Mar-1956  Age: 64 y.o. MRN: [id number]  CC:  Chief Complaint  Patient presents with  . New Patient (Initial Visit)    establish care , discuss dm     HPI Rhonda Roberts presents to establish care. She  has a past medical history of Diabetes mellitus without complication ([place]), GERD (gastroesophageal reflux disease), Headache, and Hypertension.   She is here today to establish care.  On 03/01/2020 she presented to the emergency room with intermittent chest pains with nausea, vomiting and diarrhea.  She felt like the symptoms started on Easter and progressively got worse. In the Emergency room troponin were elevated [id number] she was later transferred from [facility] to [facility].  She was diagnosed with a non-STEMI .  She underwent a cardiac cath on 03/04/2020.  It was noted at that time that she needed multivessel coronary bypass grafting for severe LV dysfunction.  She developed flash pulmonary edema placed on BIPAP. On 03/06/20 she underwent CABG bilateral radial artery harvest. She was later discharged on 03/15/20. She was discharged with home health and PT. She is being followed by Heart failure team. She has followed up closely with cardiology.  She established care with [facility] but desired to change providers. She was a previous patient of [facility] FNP however was lost to follow up due to retirement and some issues.   She admits that she is currently some sinus symptoms coughing and sneezing. She was told by cardiology that she can not start any new medications.  She did not resting well. She admits that she is up several times at night to use the bathroom.   Diabetes Mellitus Patient presents for follow up of diabetes. Current symptoms include:  hyperglycemia and polyuria. Symptoms have stabilized. Patient denies foot ulcerations, hypoglycemia , increased appetite, nausea, paresthesia of the feet, polydipsia and vomiting. Evaluation to date has included: fasting blood sugar, fasting lipid panel, hemoglobin A1C and microalbuminuria.  Home sugars: BGs are running  consistent with Hgb A1C. Current treatment: Continued insulin which has been somewhat effective.   Hypertension Patient is here for evaluation of elevated blood pressures. Cardiac symptoms: none. Patient denies chest pain, dyspnea, fatigue, irregular heart beat, palpitations and syncope. Cardiovascular risk factors: diabetes mellitus, dyslipidemia, obesity (BMI >= 30 kg/m2) and sedentary lifestyle. Use of agents associated with hypertension: none. History of target organ damage: angina/ prior myocardial infarction, chronic kidney disease, heart failure, left ventricular hypertrophy and prior coronary revascularization. She is concern about her potasium.  She was told by cardiology that she should be on a low potassium diet.  Her daughter is a Biomedical scientist and is aware of foods that she should avoid.   She is wondering if she is able to go out to different events.  She would like to start going back to church. However she has only been running to the store and not getting out.  She continues to use her walker but is able to walk around her home unassisted. She is sexually active and would like to know when she can restart activity with her boyfriend.   Skin Ulcer Patient complains of a skin ulcer. The ulcer is located on the buttocks. Ulcer has been present for several days. She admits that she  has had uses some nuskin to that area. Pain is rated as 0/10. Interventions to date: none. Patient denies tobacco use. Patient admits to having a history of diabetes.    Past Medical History:  Diagnosis Date  . Diabetes mellitus without complication ([place])   . GERD (gastroesophageal reflux disease)   .  Headache   . Hypertension     Past Surgical History:  Procedure Laterality Date  . ABDOMINAL HYSTERECTOMY    . CESAREAN SECTION  1987  . COLONOSCOPY WITH PROPOFOL N/A 05/08/2016   Procedure: COLONOSCOPY WITH PROPOFOL;  Surgeon: Carol Ada, MD;  Location: [facility];  Service: Endoscopy;  Laterality: N/A;  . CORONARY ARTERY BYPASS GRAFT N/A 03/06/2020   Procedure: CORONARY ARTERY BYPASS GRAFTING (CABG) using LIMA to LAD, RIMA to PLB, Left Radial Artery Graft: RAG to [place]; RAG to [place].;  Surgeon: Wonda Olds, MD;  Location: [facility];  Service: Open Heart Surgery;  Laterality: N/A;  BILATERAL IMA  . LEFT HEART CATH AND CORONARY ANGIOGRAPHY N/A 03/04/2020   Procedure: LEFT HEART CATH AND CORONARY ANGIOGRAPHY;  Surgeon: Belva Crome, MD;  Location: [facility];  Service: Cardiovascular;  Laterality: N/A;  . RADIAL ARTERY HARVEST Left 03/06/2020   Procedure: RADIAL ARTERY HARVEST;  Surgeon: Wonda Olds, MD;  Location: [facility];  Service: Open Heart Surgery;  Laterality: Left;  . TEE WITHOUT CARDIOVERSION N/A 03/06/2020   Procedure: TRANSESOPHAGEAL ECHOCARDIOGRAM (TEE);  Surgeon: Wonda Olds, MD;  Location: [facility];  Service: Open Heart Surgery;  Laterality: N/A;    Family History  Family history unknown: Yes    Social History   Socioeconomic History  . Marital status: Single    Spouse name: Not on file  . Number of children: Not on file  . Years of education: Not on file  . Highest education level: Not on file  Occupational History  . Occupation: retired  Tobacco Use  . Smoking status: Former Smoker    Quit date: 11/23/2004    Years since quitting: 15.4  . Smokeless tobacco: Never Used  Substance and Sexual Activity  . Alcohol use: No  . Drug use: No  . Sexual activity: Not on file  Other Topics Concern  . Not on file  Social History Narrative  . Not on file   Social Determinants of Health   Financial Resource Strain:   . Difficulty of Paying Living  Expenses:   Food Insecurity:   . Worried About Charity fundraiser in the Last Year:   . Arboriculturist in the Last Year:   Transportation Needs:   . Film/video editor (Medical):   Marland Kitchen Lack of Transportation (Non-Medical):   Physical Activity:   . Days of Exercise per Week:   . Minutes of Exercise per Session:   Stress:   . Feeling of Stress :   Social Connections:   . Frequency of Communication with Friends and Family:   . Frequency of Social Gatherings with Friends and Family:   . Attends Religious Services:   . Active Member of Clubs or Organizations:   . Attends Archivist Meetings:   Marland Kitchen Marital Status:   Intimate Partner Violence:   . Fear of Current or Ex-Partner:   . Emotionally Abused:   Marland Kitchen Physically Abused:   . Sexually Abused:     ROS Review of Systems  Objective:   Today's Vitals: BP (!) 148/74 (BP Location: Left Arm, Patient Position: Sitting)   Pulse  93   Temp 97.9 F (36.6 C) (Temporal)   Ht 5\' 2"  (1.575 m)   Wt 281 lb (127.5 kg)   SpO2 100%   BMI 51.40 kg/m   Physical Exam Constitutional:      Appearance: She is obese.  HENT:     Head: Normocephalic and atraumatic.     Mouth/Throat:     Mouth: Mucous membranes are moist.     Pharynx: Oropharynx is clear.  Eyes:     Extraocular Movements: Extraocular movements intact.     Pupils: Pupils are equal, round, and reactive to light.  Cardiovascular:     Rate and Rhythm: Rhythm irregular.     Pulses: Normal pulses.     Heart sounds: No murmur.  Pulmonary:     Effort: Pulmonary effort is normal.     Breath sounds: Normal breath sounds.  Musculoskeletal:        General: Normal range of motion.     Cervical back: Normal range of motion and neck supple.     Right lower leg: Edema (1-2+ pitting) present.     Left lower leg: Edema (1-2+ pitting) present.     Comments: Walker in use  Skin:    Capillary Refill: Capillary refill takes 2 to 3 seconds.     Findings: Erythema present.            Comments: Midsternum Left forearm  Healing surgical incision   Medial upper abdominal lap site scant amount of drainage noted on bandaid  Neurological:     General: No focal deficit present.     Mental Status: She is alert and oriented to person, place, and time.     Assessment & Plan:   Problem List Items Addressed This Visit      Cardiovascular and Mediastinum   Acute systolic HF (heart failure) ([place])   Coronary artery disease involving native coronary artery of native heart with angina pectoris (HCC)     Endocrine   Type 2 diabetes mellitus with hyperglycemia (HCC) - Primary (Chronic)   Relevant Medications   sitaGLIPtin (JANUVIA) 25 MG tablet   Other Relevant Orders   POCT Urinalysis Dipstick (Completed)   POCT glycosylated hemoglobin (Hb A1C) (Completed)   Glucose (CBG) (Completed)     Other   S/P CABG x 4   Relevant Medications   oxyCODONE (OXY IR/ROXICODONE) 5 MG immediate release tablet   Hyperlipidemia (Chronic)   Morbid obesity with BMI of 50.0-59.9, adult (HCC) (Chronic)   Relevant Medications   sitaGLIPtin (JANUVIA) 25 MG tablet    Other Visit Diagnoses    Hypertension, unspecified type       Continue with current regimen Hold spironolactone as directed by cardiology      Outpatient Encounter Medications as of 04/29/2020  Medication Sig  . aspirin 81 MG chewable tablet Chew 81 mg by mouth daily.  . carvedilol (COREG) 3.125 MG tablet Take 1 tablet (3.125 mg total) by mouth 2 (two) times daily with a meal.  . Cholecalciferol (VITAMIN D) 2000 UNITS CAPS Take 2,000 Units by mouth every morning.   . clopidogrel (PLAVIX) 75 MG tablet Take 1 tablet (75 mg total) by mouth daily.  . Cyanocobalamin (VITAMIN B 12 PO) Take 2 tablets by mouth daily.   . insulin NPH Human (NOVOLIN N) 100 UNIT/ML injection Inject 80 Units into the skin daily. May do additional 80 units if evening blood sugar over 150  . Multiple Vitamin (MULTIVITAMIN WITH MINERALS) TABS Take 1  tablet by  mouth every morning.  . ondansetron (ZOFRAN) 4 MG tablet TAKE 1 TABLET BY MOUTH EVERY 8 HOURS AS NEEDED FOR NAUSEA OR VOMITING  . sacubitril-valsartan (ENTRESTO) 49-51 MG Take 1 tablet by mouth 2 (two) times daily.  Marland Kitchen spironolactone (ALDACTONE) 25 MG tablet Take 1 tablet (25 mg total) by mouth daily.  Marland Kitchen oxyCODONE (OXY IR/ROXICODONE) 5 MG immediate release tablet Take 1-2 tablets (5-10 mg total) by mouth every 6 (six) hours as needed for severe pain.  . sitaGLIPtin (JANUVIA) 25 MG tablet Take 1 tablet (25 mg total) by mouth daily.  . [DISCONTINUED] atorvastatin (LIPITOR) 80 MG tablet Take 1 tablet (80 mg total) by mouth daily at 6 PM. (Patient not taking: Reported on 04/29/2020)  . [DISCONTINUED] oxyCODONE (OXY IR/ROXICODONE) 5 MG immediate release tablet Take 1-2 tablets (5-10 mg total) by mouth every 6 (six) hours as needed for severe pain. (Patient not taking: Reported on 04/29/2020)   No facility-administered encounter medications on file as of 04/29/2020.    Follow-up: Return in about 6 weeks (around 06/10/2020).   Vevelyn Francois, NP

## 2020-04-29 NOTE — Patient Instructions (Signed)
Diabetes Mellitus and Nutrition, Adult When you have diabetes (diabetes mellitus), it is very important to have healthy eating habits because your blood sugar (glucose) levels are greatly affected by what you eat and drink. Eating healthy foods in the appropriate amounts, at about the same times every day, can help you:  Control your blood glucose.  Lower your risk of heart disease.  Improve your blood pressure.  Reach or maintain a healthy weight. Every person with diabetes is different, and each person has different needs for a meal plan. Your health care provider may recommend that you work with a diet and nutrition specialist (dietitian) to make a meal plan that is best for you. Your meal plan may vary depending on factors such as:  The calories you need.  The medicines you take.  Your weight.  Your blood glucose, blood pressure, and cholesterol levels.  Your activity level.  Other health conditions you have, such as heart or kidney disease. How do carbohydrates affect me? Carbohydrates, also called carbs, affect your blood glucose level more than any other type of food. Eating carbs naturally raises the amount of glucose in your blood. Carb counting is a method for keeping track of how many carbs you eat. Counting carbs is important to keep your blood glucose at a healthy level, especially if you use insulin or take certain oral diabetes medicines. It is important to know how many carbs you can safely have in each meal. This is different for every person. Your dietitian can help you calculate how many carbs you should have at each meal and for each snack. Foods that contain carbs include:  Bread, cereal, rice, pasta, and crackers.  Potatoes and corn.  Peas, beans, and lentils.  Milk and yogurt.  Fruit and juice.  Desserts, such as cakes, cookies, ice cream, and candy. How does alcohol affect me? Alcohol can cause a sudden decrease in blood glucose (hypoglycemia),  especially if you use insulin or take certain oral diabetes medicines. Hypoglycemia can be a life-threatening condition. Symptoms of hypoglycemia (sleepiness, dizziness, and confusion) are similar to symptoms of having too much alcohol. If your health care provider says that alcohol is safe for you, follow these guidelines:  Limit alcohol intake to no more than 1 drink per day for nonpregnant women and 2 drinks per day for men. One drink equals 12 oz of beer, 5 oz of wine, or 1 oz of hard liquor.  Do not drink on an empty stomach.  Keep yourself hydrated with water, diet soda, or unsweetened iced tea.  Keep in mind that regular soda, juice, and other mixers may contain a lot of sugar and must be counted as carbs. What are tips for following this plan?  Reading food labels  Start by checking the serving size on the "Nutrition Facts" label of packaged foods and drinks. The amount of calories, carbs, fats, and other nutrients listed on the label is based on one serving of the item. Many items contain more than one serving per package.  Check the total grams (g) of carbs in one serving. You can calculate the number of servings of carbs in one serving by dividing the total carbs by 15. For example, if a food has 30 g of total carbs, it would be equal to 2 servings of carbs.  Check the number of grams (g) of saturated and trans fats in one serving. Choose foods that have low or no amount of these fats.  Check the number of   milligrams (mg) of salt (sodium) in one serving. Most people should limit total sodium intake to less than 2,300 mg per day.  Always check the nutrition information of foods labeled as "low-fat" or "nonfat". These foods may be higher in added sugar or refined carbs and should be avoided.  Talk to your dietitian to identify your daily goals for nutrients listed on the label. Shopping  Avoid buying canned, premade, or processed foods. These foods tend to be high in fat, sodium,  and added sugar.  Shop around the outside edge of the grocery store. This includes fresh fruits and vegetables, bulk grains, fresh meats, and fresh dairy. Cooking  Use low-heat cooking methods, such as baking, instead of high-heat cooking methods like deep frying.  Cook using healthy oils, such as olive, canola, or sunflower oil.  Avoid cooking with butter, cream, or high-fat meats. Meal planning  Eat meals and snacks regularly, preferably at the same times every day. Avoid going long periods of time without eating.  Eat foods high in fiber, such as fresh fruits, vegetables, beans, and whole grains. Talk to your dietitian about how many servings of carbs you can eat at each meal.  Eat 4-6 ounces (oz) of lean protein each day, such as lean meat, chicken, fish, eggs, or tofu. One oz of lean protein is equal to: ? 1 oz of meat, chicken, or fish. ? 1 egg. ?  cup of tofu.  Eat some foods each day that contain healthy fats, such as avocado, nuts, seeds, and fish. Lifestyle  Check your blood glucose regularly.  Exercise regularly as told by your health care provider. This may include: ? 150 minutes of moderate-intensity or vigorous-intensity exercise each week. This could be brisk walking, biking, or water aerobics. ? Stretching and doing strength exercises, such as yoga or weightlifting, at least 2 times a week.  Take medicines as told by your health care provider.  Do not use any products that contain nicotine or tobacco, such as cigarettes and e-cigarettes. If you need help quitting, ask your health care provider.  Work with a Social worker or diabetes educator to identify strategies to manage stress and any emotional and social challenges. Questions to ask a health care provider  Do I need to meet with a diabetes educator?  Do I need to meet with a dietitian?  What number can I call if I have questions?  When are the best times to check my blood glucose? Where to find more  information:  American Diabetes Association: diabetes.org  Academy of Nutrition and Dietetics: www.eatright.[employer] of Diabetes and Digestive and Kidney Diseases (NIH): [employer] Summary  A healthy meal plan will help you control your blood glucose and maintain a healthy lifestyle.  Working with a diet and nutrition specialist (dietitian) can help you make a meal plan that is best for you.  Keep in mind that carbohydrates (carbs) and alcohol have immediate effects on your blood glucose levels. It is important to count carbs and to use alcohol carefully. This information is not intended to replace advice given to you by your health care provider. Make sure you discuss any questions you have with your health care provider. Document Revised: 10/22/2017 Document Reviewed: 12/14/2016 Elsevier Patient Education  [place].   Hyperkalemia Hyperkalemia is when you have too much potassium in your blood. Potassium helps your body in many ways, but having too much can cause problems. If there is too much potassium in your blood, it can  affect how your heart works. Potassium is normally removed from your body by your kidneys. Many things can cause the amount in your blood to be high. Medicines and other treatments can be used to bring the amount to a normal level. Treatment may need to be done in the hospital. Follow these instructions at home:   Take over-the-counter and prescription medicines only as told by your doctor.  Do not take any of the following unless your doctor says it is okay: ? Supplements. ? Natural products. ? Herbs. ? Vitamins.  Limit how much alcohol you drink as told by your doctor.  Do not use drugs. If you need help quitting, ask your doctor.  If you have kidney disease, you may need to follow a low-potassium diet. A food specialist (dietitian) can help you.  Keep all follow-up visits as told by your doctor. This is  important. Contact a doctor if:  Your heartbeat is not regular or is very slow.  You feel dizzy (light-headed).  You feel weak.  You feel sick to your stomach (nauseous).  You have tingling in your hands or feet.  You lose feeling (have numbness) in your hands or feet. Get help right away if:  You are short of breath.  You have chest pain.  You pass out (faint).  You cannot move your muscles. Summary  Hyperkalemia is when you have too much potassium in your blood.  Take over-the-counter and prescription medicines only as told by your doctor.  Limit how much alcohol you drink as told by your doctor.  Contact a doctor if your heartbeat is not regular. This information is not intended to replace advice given to you by your health care provider. Make sure you discuss any questions you have with your health care provider. Document Revised: 10/25/2017 Document Reviewed: 10/25/2017 Elsevier Patient Education  [place].

## 2020-04-29 NOTE — Progress Notes (Signed)
Patient ID: Rhonda Roberts                 DOB: [birth year]                    MRN: [id number]     HPI: Rhonda Roberts is a 64 y.o. female patient referred to lipid clinic by Lyda Jester, PA-C. PMH is significant for DMII, HTN, dyslipidemia, obesity, GERD, diverticulosis, and arthritis. She recently presented to [facility] on 03/01/20 with an NSTEMI and underwent CABG x4.   She started taking atorvastatin in April 2021 after her NSTEMI. She endorsed leg pain that began a couple weeks after starting the atorvastatin and noted 90% improvement since stopping the medication about 2 weeks ago. She also noted that her blood sugar seemed to spike up to 200-300s while taking atorvastatin.   Current Lipid Medications: None Intolerances: atorvastatin (Spike in blood sugar and pain in legs) Risk Factors: CAD, DMII LDL goal: <55 (ASCVD + DMII)  Diet: low sodium diet; she eats some meat ([place] chicken and fish); she only eats food that are baked, boiled, or grilled. She also eats salads, vegetables, and fruits. The only starches she eats now are rice and potatoes. Drinks water only.   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   Labs (03/02/20): TC 216, HDL 36, LDL 159, TG 106  Past Medical History:  Diagnosis Date  . Diabetes mellitus without complication ([place])   . GERD (gastroesophageal reflux disease)   . Headache   . Hypertension     Current Outpatient Medications on File Prior to Visit  Medication Sig Dispense Refill  . aspirin 81 MG chewable tablet Chew 81 mg by mouth daily.    . carvedilol (COREG) 3.125 MG tablet Take 1 tablet (3.125 mg  total) by mouth 2 (two) times daily with a meal. 60 tablet 6  . Cholecalciferol (VITAMIN D) 2000 UNITS CAPS Take 2,000 Units by mouth every morning.     . clopidogrel (PLAVIX) 75 MG tablet Take 1 tablet (75 mg total) by mouth daily. 30 tablet 6  . Cyanocobalamin (VITAMIN B 12 PO) Take 2 tablets by mouth daily.     . insulin NPH Human (NOVOLIN N) 100 UNIT/ML injection Inject 80 Units into the skin daily. May do additional 80 units if evening blood sugar over 150    . Multiple Vitamin (MULTIVITAMIN WITH MINERALS) TABS Take 1 tablet by mouth every morning.    . ondansetron (ZOFRAN) 4 MG tablet TAKE 1 TABLET BY MOUTH EVERY 8 HOURS AS NEEDED FOR NAUSEA OR VOMITING 20 tablet 1  . oxyCODONE (OXY IR/ROXICODONE) 5 MG immediate release tablet Take 1-2 tablets (5-10 mg total) by mouth every 6 (six) hours as needed for severe pain. 30 tablet 0  . sacubitril-valsartan (ENTRESTO) 49-51 MG Take 1 tablet by mouth 2 (two) times daily. 60 tablet 6  . sitaGLIPtin (JANUVIA) 25 MG tablet Take 1 tablet (25 mg total) by mouth daily. 30 tablet 2  . spironolactone (ALDACTONE) 25 MG tablet Take 1 tablet (25 mg total) by mouth daily. 30 tablet 6   No current facility-administered medications  on file prior to visit.    Allergies  Allergen Reactions  . Sulfa Antibiotics Itching, Swelling and Rash    Assessment/Plan:  1. CAD: LHC with 3VD, now s/p CABG x4 with LIMA-LAD, seq radial-D/OM, RIMA-PDA. No chest pain. - Continue aspirin + clopidogrel - Initiate rosuvastatin 20 mg daily   2. Hyperlipidemia:  - LDL uncontrolled at 159 on 03/02/20 - Initiate rosuvastatin as above. Check lipid panel in ~5 weeks. - Will plan to add ezetimibe or PCSK9i at next visit depending on LDL lowering needed. Discussed both of these options today and pt was agreeable to plan. Discussed PCSK9i injection technique.   Kennon Holter, PharmD PGY1 Ambulatory Care Pharmacy Resident

## 2020-04-29 NOTE — Telephone Encounter (Signed)
Patient called stating that her rosuvastatin was going to be $400 due to deductible. I found patient a good rx coupon for [facility] where she was for $21.65. coupon sent to her phone.

## 2020-04-30 ENCOUNTER — Telehealth: Payer: Self-pay | Admitting: Nurse Practitioner

## 2020-04-30 NOTE — Telephone Encounter (Signed)
Pt called wanting a cheaper alternative to diabetes med Januvia.

## 2020-04-30 NOTE — Telephone Encounter (Signed)
Forwarding to provider.

## 2020-05-01 ENCOUNTER — Other Ambulatory Visit: Payer: Self-pay | Admitting: Nurse Practitioner

## 2020-05-01 MED ORDER — METFORMIN HCL 500 MG PO TABS
500.0000 mg | ORAL_TABLET | Freq: Two times a day (BID) | ORAL | 3 refills | Status: DC
Start: 2020-05-01 — End: 2020-06-17

## 2020-05-01 NOTE — Telephone Encounter (Signed)
She can do metformin unless she had side effects in the past

## 2020-05-01 NOTE — Telephone Encounter (Signed)
Called and spoke with patient she said that she  okay with metformin, she hadn't  any issue with this medication before . The Januvia will cost her $400.00 , she would like the metformin sent to [facility] on [address]

## 2020-05-01 NOTE — Telephone Encounter (Signed)
sent 

## 2020-05-02 ENCOUNTER — Telehealth (HOSPITAL_COMMUNITY): Payer: Self-pay | Admitting: Pharmacy Technician

## 2020-05-02 NOTE — Telephone Encounter (Signed)
Called and inform the patient

## 2020-05-02 NOTE — Telephone Encounter (Signed)
Received a message that patient was needing help with Entresto.   Saw that CSW sent a patient assistance application in April. Called and spoke with [employer], patient was approved for assistance from 03/20/20-03/20/21.   I called and gave her the phone number to the foundation.  Patient called back, stating she would get the medication Monday. She will be by today to pick up the sample to get her through until her shipment arrives.  Charlann Boxer, CPhT

## 2020-05-02 NOTE — Telephone Encounter (Signed)
Patient returned call stating that she received a call from Kathlee Nations stating that she had been approved through [employer] for patient assistance for her Delene Loll. Patient stated that they stated it would be about 2 weeks before her shipment arrived and she wanted to know if she could receive some samples until then. 2 bottles of sample meds were left up front to picked up before 5pm. Patient was very appreciative.    Medication Samples have been provided to the patient.  Drug name: Delene Loll       Strength: 49-51mg         Qty: 2  LOT: [id number]  Exp.Date: 03/2022  Dosing instructions: take 1 tab po bid  The patient has been instructed regarding the correct time, dose, and frequency of taking this medication, including desired effects and most common side effects.   Blanche Gallien R Taahir Grisby 5:39 AM 05/02/2020

## 2020-05-10 MED FILL — SPIRONOLACTONE 25 MG TABS: 25 | 30 days supply | Qty: 30 | Fill #1

## 2020-05-10 MED FILL — CLOPIDOGREL 75 MG TABLET: 75 | 30 days supply | Qty: 30 | Fill #1

## 2020-05-10 MED FILL — CARVEDILOL 3.125 MG TABLET: 3.125 | 30 days supply | Qty: 60 | Fill #1

## 2020-05-13 ENCOUNTER — Encounter (HOSPITAL_COMMUNITY): Payer: PRIVATE HEALTH INSURANCE

## 2020-05-13 ENCOUNTER — Ambulatory Visit: Payer: No Typology Code available for payment source | Admitting: Family Medicine

## 2020-05-14 ENCOUNTER — Telehealth (HOSPITAL_COMMUNITY): Payer: Self-pay | Admitting: Vascular Surgery

## 2020-05-14 NOTE — Telephone Encounter (Signed)
Left pt message that 05/20/20 appt has been cancel due to schedule changes

## 2020-05-20 ENCOUNTER — Encounter (HOSPITAL_COMMUNITY): Payer: PRIVATE HEALTH INSURANCE

## 2020-05-22 ENCOUNTER — Telehealth (HOSPITAL_COMMUNITY): Payer: Self-pay | Admitting: Vascular Surgery

## 2020-05-22 NOTE — Telephone Encounter (Signed)
Pt left message on scheduling line, pt would like appt next week, 05/20/20 appt was canceled due to PA/NP scheduling changes , she has appt w/ DB 7/26, pt has some questions about physical activity and her arm .. please advise

## 2020-05-22 NOTE — Telephone Encounter (Signed)
Spoke with patient she does not have any complaints at this time. She wanted to know if she could resume normal activities as in cleaning her house and intercourse with her fiance. Pt denies chest pain,fatigue, or shortness of breath. Per Amy Clegg,NP pt may resume normal activity but advised to contact our office if she experiences any symptoms. Pt ok to keep her follow up in July.

## 2020-05-23 ENCOUNTER — Telehealth: Payer: Self-pay | Admitting: Licensed Clinical Social Worker

## 2020-05-23 NOTE — Telephone Encounter (Signed)
Call placed to patient regarding [facility] referral. LCSW left message requesting a return call.  

## 2020-05-24 ENCOUNTER — Telehealth: Payer: Self-pay | Admitting: Nurse Practitioner

## 2020-05-24 NOTE — Telephone Encounter (Signed)
Pt called in oxycodone 5mg 

## 2020-05-27 ENCOUNTER — Other Ambulatory Visit: Payer: Self-pay | Admitting: Family Medicine

## 2020-05-27 DIAGNOSIS — Z951 Presence of aortocoronary bypass graft: Secondary | ICD-10-CM

## 2020-06-03 ENCOUNTER — Other Ambulatory Visit: Payer: Self-pay

## 2020-06-03 ENCOUNTER — Encounter: Payer: PRIVATE HEALTH INSURANCE | Attending: Cardiothoracic Surgery | Admitting: Registered"

## 2020-06-03 ENCOUNTER — Encounter: Payer: Self-pay | Admitting: Registered"

## 2020-06-03 ENCOUNTER — Other Ambulatory Visit: Payer: PRIVATE HEALTH INSURANCE | Admitting: *Deleted

## 2020-06-03 DIAGNOSIS — E7800 Pure hypercholesterolemia, unspecified: Secondary | ICD-10-CM

## 2020-06-03 DIAGNOSIS — E1165 Type 2 diabetes mellitus with hyperglycemia: Secondary | ICD-10-CM | POA: Diagnosis present

## 2020-06-03 LAB — LIPID PANEL
Chol/HDL Ratio: 3.5 ratio (ref 0.0–4.4)
Cholesterol, Total: 131 mg/dL (ref 100–199)
HDL: 37 mg/dL — ABNORMAL LOW (ref >39)
LDL Chol Calc (NIH): 77 mg/dL (ref 0–99)
Triglycerides: 88 mg/dL (ref 0–149)
VLDL Cholesterol Cal: 17 mg/dL (ref 5–40)

## 2020-06-03 NOTE — Patient Instructions (Addendum)
Consider adding protein to your fruit snacks Consider trying [place] yogurt again Consider talking to your MD about your insulin regime. You can use the graph to have that conversation. Consider looking through the list of medications since your MD may be adding an oral medication at your next visit.

## 2020-06-03 NOTE — Progress Notes (Signed)
Diabetes Self-Management Education  Visit Type: First/Initial  Appt. Start Time: 1105 Appt. End Time: 1210  06/03/2020  [name], identified by name and date of birth, is a 64 y.o. female with a diagnosis of Diabetes: Type 2.   This patient is accompanied in the office by her daughter.  ASSESSMENT  There were no vitals taken for this visit. There is no height or weight on file to calculate BMI.   Pt states she has been following the original education guidelines from when she was diagnosed in 2006. Pt reports measuring carbohydrate choices and staying around 45 g per meal. Patient's daughter has been preparing most of her meals. Pt states she had a hard time getting used to low sodium foods.  Pt states since she had heart surgery she was told she cannot take metformin and needs to follow a low-sodium diet. Pt states she is also concerned about getting too much potassium. RD did not address potassium this visit. If MD would like another visit specific to this, will cover at another time.  Patient's meals sound pretty well balanced. Pt reported snacks could use a little more protein.  Pt reports she had to switch from insulin pens to using NPH due to cost. Pt states she is comfortable using syringes. Pt reports insulin based on CBGs. If FBS or nighttime blood sugar is 180 mg/dL or high, patient will inject 80 units.  FBS: 130-179 Evening CBGs: 140-300 mg/dL.   Physical activity: Patient states she is motivated to continue her routine of 2x/day for 20 min due to heart health and blood sugar control.   Diabetes Self-Management Education - 06/03/20 1121      Visit Information   Visit Type First/Initial      Initial Visit   Diabetes Type Type 2    Are you currently following a meal plan? Yes    What type of meal plan do you follow? low sodium/low carb    Are you taking your medications as prescribed? Yes    Date Diagnosed 2006      Health Coping   How would you rate your  overall health? Fair      Psychosocial Assessment   Patient Belief/Attitude about Diabetes Motivated to manage diabetes    How often do you need to have someone help you when you read instructions, pamphlets, or other written materials from your doctor or pharmacy? 1 - Never    What is the last grade level you completed in school? some college      Complications   Last HgB A1C per patient/outside source 8.4 %    How often do you check your blood sugar? 1-2 times/day    Fasting Blood glucose range (mg/dL) 70-129;130-179   98-170   Have you had a dilated eye exam in the past 12 months? No    Have you had a dental exam in the past 12 months? No    Are you checking your feet? Yes    How many days per week are you checking your feet? 4      Dietary Intake   Breakfast cereal OR boiled egg 3x week, breakfast meat OR grits, meat    Snack (morning) fruit OR crackers OR 100 calories snacks OR vanilla, yogurt    Lunch chicken fish or [place], vegetables, 3//4 c rice or potatoes    Snack (afternoon) fruit OR sugar free jello    Dinner grilled chicken salad OR sandwich    Snack (evening) fruit  OR 1/2 cup ice cream, coffee    Beverage(s) water decaf coffee,      Exercise   Exercise Type Light (walking / raking leaves)    How many days per week to you exercise? 7    How many minutes per day do you exercise? 40    Total minutes per week of exercise 280      Patient Education   Previous Diabetes Education Yes (please comment)   2006   Nutrition management  Role of diet in the treatment of diabetes and the relationship between the three main macronutrients and blood glucose level    Physical activity and exercise  Role of exercise on diabetes management, blood pressure control and cardiac health.;Identified with patient nutritional and/or medication changes necessary with exercise.    Medications Reviewed patients medication for diabetes, action, purpose, timing of dose and side effects.       Individualized Goals (developed by patient)   Nutrition General guidelines for healthy choices and portions discussed    Physical Activity Exercise 5-7 days per week    Medications Other (comment)   discuss insulin with MD at next visit     Outcomes   Expected Outcomes Demonstrated interest in learning. Expect positive outcomes    Future DMSE PRN    Program Status Completed           Individualized Plan for Diabetes Self-Management Training:   Learning Objective:  Patient will have a greater understanding of diabetes self-management. Patient education plan is to attend individual and/or group sessions per assessed needs and concerns.   Patient Instructions  Consider adding protein to your fruit snacks Consider trying [place] yogurt again Consider talking to your MD about your insulin regime. You can use the graph to have that conversation. Consider looking through the list of medications since your MD may be adding an oral medication at your next.   Expected Outcomes:  Demonstrated interest in learning. Expect positive outcomes  Education material provided: Types of Insulin, Non-insulin medications, graph of insulin action (sent in mail after appointment)  If problems or questions, patient to contact team via:  Phone and MyChart  Future DSME appointment: PRN

## 2020-06-06 ENCOUNTER — Other Ambulatory Visit: Payer: Self-pay

## 2020-06-06 DIAGNOSIS — Z951 Presence of aortocoronary bypass graft: Secondary | ICD-10-CM

## 2020-06-06 NOTE — Telephone Encounter (Signed)
Please contact her and do a pain assessment on her. We had agreed to a one time refill Thanks

## 2020-06-06 NOTE — Telephone Encounter (Signed)
Please review  Thank you

## 2020-06-10 ENCOUNTER — Ambulatory Visit: Payer: PRIVATE HEALTH INSURANCE | Admitting: Nurse Practitioner

## 2020-06-11 MED FILL — CARVEDILOL 3.125 MG TABLET: 3.125 | 30 days supply | Qty: 60 | Fill #2

## 2020-06-11 MED FILL — SPIRONOLACTONE 25 MG TABS: 25 | 30 days supply | Qty: 30 | Fill #2

## 2020-06-11 MED FILL — CLOPIDOGREL 75 MG TABLET: 75 | 30 days supply | Qty: 30 | Fill #2

## 2020-06-17 ENCOUNTER — Other Ambulatory Visit: Payer: Self-pay

## 2020-06-17 ENCOUNTER — Encounter (HOSPITAL_COMMUNITY): Payer: Self-pay | Admitting: Cardiology

## 2020-06-17 ENCOUNTER — Ambulatory Visit (HOSPITAL_BASED_OUTPATIENT_CLINIC_OR_DEPARTMENT_OTHER)
Admission: RE | Admit: 2020-06-17 | Discharge: 2020-06-17 | Disposition: A | Discharge status: Home / Self Care | Payer: No Typology Code available for payment source | Source: Ambulatory Visit | Attending: Cardiology | Admitting: Cardiology

## 2020-06-17 ENCOUNTER — Ambulatory Visit (HOSPITAL_COMMUNITY)
Admission: RE | Admit: 2020-06-17 | Discharge: 2020-06-17 | Disposition: A | Discharge status: Home / Self Care | Payer: No Typology Code available for payment source | Source: Ambulatory Visit | Attending: Cardiology | Admitting: Cardiology

## 2020-06-17 VITALS — BP 122/88 | HR 81 | Wt 284.6 lb

## 2020-06-17 DIAGNOSIS — I2510 Atherosclerotic heart disease of native coronary artery without angina pectoris: Secondary | ICD-10-CM | POA: Diagnosis not present

## 2020-06-17 DIAGNOSIS — I5022 Chronic systolic (congestive) heart failure: Secondary | ICD-10-CM

## 2020-06-17 DIAGNOSIS — E669 Obesity, unspecified: Secondary | ICD-10-CM | POA: Insufficient documentation

## 2020-06-17 DIAGNOSIS — K5790 Diverticulosis of intestine, part unspecified, without perforation or abscess without bleeding: Secondary | ICD-10-CM | POA: Insufficient documentation

## 2020-06-17 DIAGNOSIS — N1831 Chronic kidney disease, stage 3a: Secondary | ICD-10-CM | POA: Insufficient documentation

## 2020-06-17 DIAGNOSIS — E1122 Type 2 diabetes mellitus with diabetic chronic kidney disease: Secondary | ICD-10-CM | POA: Diagnosis not present

## 2020-06-17 DIAGNOSIS — Z87891 Personal history of nicotine dependence: Secondary | ICD-10-CM | POA: Diagnosis not present

## 2020-06-17 DIAGNOSIS — E785 Hyperlipidemia, unspecified: Secondary | ICD-10-CM

## 2020-06-17 DIAGNOSIS — K219 Gastro-esophageal reflux disease without esophagitis: Secondary | ICD-10-CM | POA: Diagnosis not present

## 2020-06-17 DIAGNOSIS — Z79899 Other long term (current) drug therapy: Secondary | ICD-10-CM | POA: Insufficient documentation

## 2020-06-17 DIAGNOSIS — Z794 Long term (current) use of insulin: Secondary | ICD-10-CM | POA: Diagnosis not present

## 2020-06-17 DIAGNOSIS — I130 Hypertensive heart and chronic kidney disease with heart failure and stage 1 through stage 4 chronic kidney disease, or unspecified chronic kidney disease: Secondary | ICD-10-CM | POA: Diagnosis not present

## 2020-06-17 DIAGNOSIS — I252 Old myocardial infarction: Secondary | ICD-10-CM | POA: Diagnosis not present

## 2020-06-17 DIAGNOSIS — Z951 Presence of aortocoronary bypass graft: Secondary | ICD-10-CM | POA: Diagnosis not present

## 2020-06-17 DIAGNOSIS — Z7982 Long term (current) use of aspirin: Secondary | ICD-10-CM | POA: Diagnosis not present

## 2020-06-17 DIAGNOSIS — Z7902 Long term (current) use of antithrombotics/antiplatelets: Secondary | ICD-10-CM | POA: Diagnosis not present

## 2020-06-17 LAB — BASIC METABOLIC PANEL
Anion gap: 7 (ref 5–15)
BUN: 28 mg/dL — ABNORMAL HIGH (ref 8–23)
CO2: 25 mmol/L (ref 22–32)
Calcium: 9.5 mg/dL (ref 8.9–10.3)
Chloride: 105 mmol/L (ref 98–111)
Creatinine, Ser: 1.3 mg/dL — ABNORMAL HIGH (ref 0.44–1.00)
GFR calc Af Amer: 50 mL/min — ABNORMAL LOW (ref >60)
GFR calc non Af Amer: 43 mL/min — ABNORMAL LOW (ref >60)
Glucose, Bld: 138 mg/dL — ABNORMAL HIGH (ref 70–99)
Potassium: 4.8 mmol/L (ref 3.5–5.1)
Sodium: 137 mmol/L (ref 135–145)

## 2020-06-17 LAB — ECHOCARDIOGRAM COMPLETE
Area-P 1/2: 5.97 cm2
S' Lateral: 2.9 cm
Single Plane A4C EF: 42.9 %

## 2020-06-17 MED ORDER — CLOPIDOGREL BISULFATE 75 MG PO TABS
75.0000 mg | ORAL_TABLET | Freq: Every day | ORAL | 3 refills | Status: DC
Start: 2020-06-17 — End: 2021-02-14

## 2020-06-17 MED ORDER — CARVEDILOL 6.25 MG PO TABS: 6.2500 mg | ORAL_TABLET | Freq: Two times a day (BID) | ORAL | 6 refills | Status: DC

## 2020-06-17 MED ORDER — ROSUVASTATIN CALCIUM 40 MG PO TABS
40.0000 mg | ORAL_TABLET | Freq: Every day | ORAL | 3 refills | Status: AC
Start: 2020-06-17 — End: ?

## 2020-06-17 NOTE — Progress Notes (Signed)
°  Echocardiogram 2D Echocardiogram has been performed.  Rhonda Roberts 06/17/2020, 10:47 AM

## 2020-06-17 NOTE — Progress Notes (Signed)
PCP: None  HF Cardiologist: Dr Aundra Dubin  CT Surgeon: Dr Orvan Seen   HPI: Rhonda Roberts is a 64 y.o. with history of DMII, HTN, dyslipidemia, obesity, GERD, diverticulosis, and arthritis.   Presented to [facility] ED on 03/01/2020 with N/V and intermittent chest pain. EKG with ST depression inferior and anterolatera leads. HS Trop [phone] (NSTEMI). LHC with 3 vessel disease, low EF, and elevated LVEDP.  Echo with EF 20-25%. Post cath she was acutely short of breath. Placed on bipap, nitro drip at 10 mcg, and diuresed with IV lasix. CT surgery consulted. Once tuned up she underwent CABGx4. Gradually drips stopped. HF meds started. Discharged to home with her daughter.   Echo was done today and reviewed, EF up to 45-50%.   Today she returns for HF follow up. She has been doing well.  Now on Crestor because of myalgias with atorvastatin. No dyspnea walking on flat ground or with housework.  No chest pain.  Weight is down 8 lbs.  No lightheadedness.  No orthopnea/PND.   Labs (5/21): K 4.5, creatinine 1.54 Labs (7/21): LDL 77, HDL 37  PMH: 1. Type 2 diabetes 2. HTN 3. Hyperlipidemia: Myalgias with atorvastatin.  4. Obesity 5. GERD 6. Diverticulosis 7. CAD: NSTEMI in 4/21.  - LHC with 3VD, CABG with LIMA-LAD, pedicled RIMA-PDA, sequential left radial to OM and D.  8. Chronic systolic CHF: Ischemic cardiomyopathy.  - Echo (4/21): EF 20-25%.   - Echo (7/21): EF 45-50%, basal-mid inferior hypokinesis, normal RV.   ROS: All systems negative except as listed in HPI, PMH and Problem List.  Social History   Socioeconomic History  . Marital status: Single    Spouse name: Not on file  . Number of children: Not on file  . Years of education: Not on file  . Highest education level: Not on file  Occupational History  . Occupation: retired  Tobacco Use  . Smoking status: Former Smoker    Quit date: 11/23/2004    Years since quitting: 15.5  . Smokeless tobacco: Never Used  Vaping Use  . Vaping Use:  Never used  Substance and Sexual Activity  . Alcohol use: No  . Drug use: No  . Sexual activity: Not on file  Other Topics Concern  . Not on file  Social History Narrative  . Not on file   Social Determinants of Health   Financial Resource Strain:   . Difficulty of Paying Living Expenses:   Food Insecurity:   . Worried About Charity fundraiser in the Last Year:   . Arboriculturist in the Last Year:   Transportation Needs:   . Film/video editor (Medical):   Marland Kitchen Lack of Transportation (Non-Medical):   Physical Activity:   . Days of Exercise per Week:   . Minutes of Exercise per Session:   Stress:   . Feeling of Stress :   Social Connections:   . Frequency of Communication with Friends and Family:   . Frequency of Social Gatherings with Friends and Family:   . Attends Religious Services:   . Active Member of Clubs or Organizations:   . Attends Archivist Meetings:   Marland Kitchen Marital Status:   Intimate Partner Violence:   . Fear of Current or Ex-Partner:   . Emotionally Abused:   Marland Kitchen Physically Abused:   . Sexually Abused:     FH: No premature CAD.    Current Outpatient Medications  Medication Sig Dispense Refill  . aspirin 81 MG  chewable tablet Chew 81 mg by mouth daily.    . carvedilol (COREG) 6.25 MG tablet Take 1 tablet (6.25 mg total) by mouth 2 (two) times daily with a meal. 60 tablet 6  . Cholecalciferol (VITAMIN D) 2000 UNITS CAPS Take 2,000 Units by mouth every morning.     . Cyanocobalamin (VITAMIN B 12 PO) Take 2 tablets by mouth daily.     . insulin NPH Human (NOVOLIN N) 100 UNIT/ML injection Inject 80 Units into the skin daily. May do additional 80 units if evening blood sugar over 150    . Multiple Vitamin (MULTIVITAMIN WITH MINERALS) TABS Take 1 tablet by mouth every morning.    . ondansetron (ZOFRAN) 4 MG tablet TAKE 1 TABLET BY MOUTH EVERY 8 HOURS AS NEEDED FOR NAUSEA OR VOMITING 20 tablet 1  . rosuvastatin (CRESTOR) 40 MG tablet Take 1 tablet (40  mg total) by mouth daily. 90 tablet 3  . sacubitril-valsartan (ENTRESTO) 49-51 MG Take 1 tablet by mouth 2 (two) times daily. 60 tablet 6  . clopidogrel (PLAVIX) 75 MG tablet Take 1 tablet (75 mg total) by mouth daily. 90 tablet 3  . spironolactone (ALDACTONE) 25 MG tablet Take 1 tablet (25 mg total) by mouth daily. 30 tablet 6   No current facility-administered medications for this encounter.    Vitals:   06/17/20 1110  BP: (!) 122/88  Pulse: 81  SpO2: 98%  Weight: (!) 129.1 kg (284 lb 9.6 oz)   Wt Readings from Last 3 Encounters:  06/17/20 (!) 129.1 kg (284 lb 9.6 oz)  04/29/20 127.5 kg (281 lb)  04/09/20 132.5 kg (292 lb)     PHYSICAL EXAM: General: NAD, obese.  Neck: JVP 7-8 cm, no thyromegaly or thyroid nodule.  Lungs: Clear to auscultation bilaterally with normal respiratory effort. CV: Nondisplaced PMI.  Heart regular S1/S2, no S3/S4, 1/6 SEM RUSB.  1+ ankle edema.  No carotid bruit.  Normal pedal pulses.  Abdomen: Soft, nontender, no hepatosplenomegaly, no distention.  Skin: Intact without lesions or rashes.  Neurologic: Alert and oriented x 3.  Psych: Normal affect. Extremities: No clubbing or cyanosis.  HEENT: Normal.   ASSESSMENT & PLAN: 1. CAD:  NSTEMI in 4/21, LHC with 3VD, now s/p CABG x4 with LIMA-LAD, seq radial-D/OM, RIMA-PDA.  No chest pain. - Continue ASA 81 mg daily, Plavix 75 mg daily.  - Continue Crestor.  - Refer to cardiac rehab.  2. Chronic Systolic CHF: Ischemic cardiomyopathy.  Echo pre-op with 4/21 EF 20-25%.  Echo was done today and reviewed, EF up to 45-50%. NYHA class II, not volume overloaded on exam.  - Continue Entresto 49/51 bid.   - Continue spironolactone 25 qd. BMET today.  - Increase Coreg to 6.25 mg bid.   - EF is out of ICD range.  3. CKD: Stage IIIa.  - Check BMET  4. HLD: Myalgias with atorvastatin, now on Crestor 20 mg daily with no problems.  Want to see LDL < 70.  - Increase Crestor to 40 mg daily.  Lipids/LFTs in 2 months.    Followup in 3 months.   Loralie Champagne 06/17/2020

## 2020-06-17 NOTE — Patient Instructions (Signed)
Increase Carvedilol to 6.25 mg Twice daily   Increase Crestor to 40 mg Daily  MAKE SURE YOU ARE TAKING PLAVIX (CLOPIDIGREAL) 75 MG DAILY, we have sent you in a prescription for this  Labs done today, your results will be available in MyChart, we will contact you for abnormal readings.  Your physician recommends that you return for a FASTING lipid profile: in 2 months  Your physician recommends that you schedule a follow-up appointment in: 3 months  If you have any questions or concerns before your next appointment please send [place] a message through [place] or call our office at [phone].    TO LEAVE A MESSAGE FOR THE NURSE SELECT OPTION 2, PLEASE LEAVE A MESSAGE INCLUDING: . YOUR NAME . DATE OF BIRTH . CALL BACK NUMBER . REASON FOR CALL**this is important as we prioritize the call backs  [place] AS LONG AS YOU CALL BEFORE 4:00 PM  At the [facility], you and your health needs are our priority. As part of our continuing mission to provide you with exceptional heart care, we have created designated Provider Care Teams. These Care Teams include your primary Cardiologist (physician) and Advanced Practice Providers (APPs- Physician Assistants and Nurse Practitioners) who all work together to provide you with the care you need, when you need it.   You may see any of the following providers on your designated Care Team at your next follow up: Marland Kitchen Dr Glori Bickers . Dr Loralie Champagne . Darrick Grinder, NP . Lyda Jester, PA . Audry Riles, PharmD   Please be sure to bring in all your medications bottles to every appointment.

## 2020-06-19 ENCOUNTER — Institutional Professional Consult (permissible substitution): Payer: PRIVATE HEALTH INSURANCE | Admitting: Licensed Clinical Social Worker

## 2020-06-27 ENCOUNTER — Other Ambulatory Visit (HOSPITAL_COMMUNITY): Payer: Self-pay | Admitting: Cardiology

## 2020-06-27 ENCOUNTER — Other Ambulatory Visit (HOSPITAL_COMMUNITY): Payer: Self-pay

## 2020-06-27 MED ORDER — CARVEDILOL 6.25 MG PO TABS: 6.2500 mg | ORAL_TABLET | Freq: Two times a day (BID) | ORAL | 6 refills | Status: DC

## 2020-06-27 MED FILL — CARVEDILOL 6.25 MG TABLET: 6.25 | 30 days supply | Qty: 60 | Fill #0

## 2020-06-27 NOTE — Telephone Encounter (Signed)
Meds ordered this encounter  Medications  . carvedilol (COREG) 6.25 MG tablet    Sig: Take 1 tablet (6.25 mg total) by mouth 2 (two) times daily with a meal.    Dispense:  60 tablet    Refill:  6    Please cancel all previous orders for current medication. Change in dosage or pill size.

## 2020-06-28 ENCOUNTER — Telehealth (HOSPITAL_COMMUNITY): Payer: Self-pay

## 2020-06-28 NOTE — Telephone Encounter (Signed)
Received a fax requesting medical records from [facility]. Records were successfully faxed to: [phone] ,which was the number provided.. Medical request form will be scanned into patients chart.

## 2020-07-01 ENCOUNTER — Ambulatory Visit: Payer: No Typology Code available for payment source | Admitting: Cardiovascular Disease

## 2020-07-03 ENCOUNTER — Telehealth (HOSPITAL_COMMUNITY): Payer: Self-pay

## 2020-07-03 ENCOUNTER — Encounter (HOSPITAL_COMMUNITY): Payer: Self-pay

## 2020-07-03 NOTE — Telephone Encounter (Signed)
Per Naomie Dean with [facility] pt does not outpatient services benefits.  Ref#[id number], 07/03/20 @ 412PM

## 2020-07-03 NOTE — Telephone Encounter (Signed)
Attempted to call patient in regards to Cardiac Rehab - Unable to leave VM.  Mailed letter

## 2020-07-10 MED FILL — CLOPIDOGREL 75 MG TABLET: 75 | 30 days supply | Qty: 30 | Fill #3

## 2020-07-10 MED FILL — SPIRONOLACTONE 25 MG TABS: 25 | 30 days supply | Qty: 30 | Fill #3

## 2020-07-22 ENCOUNTER — Telehealth (HOSPITAL_COMMUNITY): Payer: Self-pay | Admitting: Pharmacist

## 2020-07-23 NOTE — Telephone Encounter (Signed)
  Cardiac Rehab Medication Review by a Pharmacist  Does the patient  feel that his/her medications are working for him/her?  yes  Has the patient been experiencing any side effects to the medications prescribed?  No, she does note that sometimes she has residual pain in her chest/arm   Does the patient measure his/her own blood pressure or blood glucose at home?  yes  BP usually 120/74 but fluctuates BG ranges from 90-200, she takes it in the morning and at night before bed  Does the patient have any problems obtaining medications due to transportation or finances?   Yes, patient reports cost of rosuvastatin was initially very high but she was given a coupon that adjusted the cost to $21/month. She is due for a refill soon and hopes the coupon will continue to work.   Understanding of regimen: good Understanding of indications: good Potential of compliance: good   Pharmacist Intervention: [place]  PGY1 Pharmacy Resident 07/23/2020 10:47 AM

## 2020-07-24 ENCOUNTER — Encounter (HOSPITAL_COMMUNITY)
Admission: RE | Admit: 2020-07-24 | Discharge: 2020-07-24 | Disposition: A | Discharge status: Home / Self Care | Payer: PRIVATE HEALTH INSURANCE | Source: Ambulatory Visit | Attending: Cardiology | Admitting: Cardiology

## 2020-07-24 ENCOUNTER — Other Ambulatory Visit: Payer: Self-pay

## 2020-07-24 ENCOUNTER — Telehealth (HOSPITAL_COMMUNITY): Payer: Self-pay

## 2020-07-24 ENCOUNTER — Telehealth: Payer: Self-pay | Admitting: Cardiovascular Disease

## 2020-07-24 DIAGNOSIS — I214 Non-ST elevation (NSTEMI) myocardial infarction: Secondary | ICD-10-CM | POA: Insufficient documentation

## 2020-07-24 DIAGNOSIS — Z951 Presence of aortocoronary bypass graft: Secondary | ICD-10-CM | POA: Insufficient documentation

## 2020-07-24 NOTE — Telephone Encounter (Signed)
Spoke with patient and she has been having intermittent chest pain since her CABG in April Per patient the pain is around the incision and worse at night  Pain is not worse with exertion or relieved with rest, does seem worse at times with movement  Denies feeling like pain prior to surgery She was wanting refill on pain medications, advised she would need to call PCP  Advised to keep visit next week as schedule and call back if worsening of symptoms  Left Porshca message I spoke with patient and documented in Epic

## 2020-07-24 NOTE — Telephone Encounter (Signed)
Cardiac Rehab Note:  Unsuccessful telephone call to Rhonda Roberts to confirm cardiac rehab orientation appointment on 07/25/20 at 9:30 am and to consent patient to participation in the Virtual Cardiac Rehab Program utilizing the Better Hearts App. Hipaa compliant VM message requesting call back to [phone].  Unknown Rhonda E. Rollene Rotunda RN, BSN [place]. [facility]  Cardiac and Pulmonary Rehabilitation Phone: [phone] Fax: [phone]

## 2020-07-24 NOTE — Progress Notes (Signed)
         Confirm Consent - In the setting of the current Covid19 crisis, you are scheduled for a phone visit with your Cardiac or Pulmonary team member.  Just as we do with many in-gym visits, in order for you to participate in this visit, we must obtain consent.  If you'd like, I can send this to your mychart (if signed up) or email for you to review.  Otherwise, I can obtain your verbal consent now.  By agreeing to a telephone visit, we'd like you to understand that the technology does not allow for your Cardiac or Pulmonary Rehab team member to perform a physical assessment, and thus may limit their ability to fully assess your ability to perform exercise programs. If your provider identifies any concerns that need to be evaluated in person, we will make arrangements to do so.  Finally, though the technology is pretty good, we cannot assure that it will always work on either your or our end and we cannot ensure that we have a secure connection.  Cardiac and Pulmonary Rehab Telehealth visits and "At Home" cardiac and pulmonary rehab are provided at no cost to you.        Are you willing to proceed?"        STAFF: Did the patient verbally acknowledge consent to telehealth visit? Document YES/NO here: Yes     Maurice Small RN, BSN Cardiac and Pulmonary Rehab Nurse Navigator    Cardiac and Pulmonary Rehab Staff        Date 9/1   @ Time [phone]

## 2020-07-24 NOTE — Progress Notes (Signed)
Rhonda Roberts returned call from message left earlier today.  Advised pt that we wanted to clarify the complaint of chest pain that ws reported to the pharmacist.  Pt stated that she did tell the pharmacist she has "soreness" along her incision that occurs during the night.  Pt has to shift position from right to left and eventually ends up on her back to sleep.   Depending upon how active she is the soreness can extend into the arms.  She wanted to see if she could get something for the soreness but wasn't sure of who to contact.  Advised pt that she could contact the surgeon office since it was directly related to her incision and differed from the discomfort that brought her to the hosptial but since her surgery was in April she may be referred to her PCP.  Pt verbalized understanding and is looking forward to participating in Virtual Cardiac Rehab.  Pt is interested in participating in Virtual Cardiac and Pulmonary Rehab. Pt advised that Virtual Cardiac and Pulmonary Rehab is provided at no cost to the patient.  Checklist:  1. Pt has smart device  ie smartphone and/or ipad for downloading an app  Yes 2. Reliable internet/wifi service    Yes 3. Understands how to use their smartphone and navigate within an app.  Yes   Pt verbalized understanding and is in agreement.  Pt  was able to download the Better Hearts app on their smart device with no issues. Pt set up their account and received the following welcome message -"Welcome to the [place] and Pulmonary Rehabilitation program. We hope that you will find the exercise program beneficial in your recovery process. Our staff is available to assist with any questions/concerns about your exercise routine. Best wishes". Brief orientation provided to with the advisement to watch the "Intro to Rehab" series located under the Resource tab. Pt verbalized understanding. Will continue to follow and monitor pt progress with feedback as  needed.  Cherre Huger, BSN Cardiac and Training and development officer

## 2020-07-24 NOTE — Telephone Encounter (Signed)
     Porscha from [facility] called, she said while pt picking up medication, her pharmacy called her to advised pt experiencing chest pain and its radiating to her left arm. Porscha wants to get a callback from RN

## 2020-07-25 ENCOUNTER — Inpatient Hospital Stay (HOSPITAL_COMMUNITY)
Admission: RE | Admit: 2020-07-25 | Discharge: 2020-07-25 | Disposition: A | Discharge status: Home / Self Care | Payer: PRIVATE HEALTH INSURANCE | Source: Ambulatory Visit

## 2020-07-25 ENCOUNTER — Telehealth (HOSPITAL_COMMUNITY): Payer: Self-pay | Admitting: *Deleted

## 2020-07-25 NOTE — Telephone Encounter (Signed)
Patient called to report that she has diarrhea and will not be able to attend orientation this morning. Will cancel this morning's appointment. [name] says she would like to reschedule her appointment when she is feeling better.Barnet Pall, RN,BSN 07/25/2020 7:52 AM

## 2020-07-25 NOTE — Telephone Encounter (Signed)
Agree 

## 2020-07-26 ENCOUNTER — Other Ambulatory Visit (HOSPITAL_COMMUNITY): Payer: Self-pay | Admitting: Cardiology

## 2020-07-26 ENCOUNTER — Other Ambulatory Visit (HOSPITAL_COMMUNITY): Payer: Self-pay | Admitting: *Deleted

## 2020-07-26 MED ORDER — CARVEDILOL 6.25 MG PO TABS: 6.2500 mg | ORAL_TABLET | Freq: Two times a day (BID) | ORAL | 2 refills | Status: DC

## 2020-07-26 MED FILL — CARVEDILOL 6.25 MG TABLET: 6.25 | 30 days supply | Qty: 60 | Fill #0

## 2020-08-01 ENCOUNTER — Encounter: Payer: Self-pay | Admitting: Cardiovascular Disease

## 2020-08-01 ENCOUNTER — Other Ambulatory Visit: Payer: Self-pay

## 2020-08-01 ENCOUNTER — Ambulatory Visit (INDEPENDENT_AMBULATORY_CARE_PROVIDER_SITE_OTHER): Payer: PRIVATE HEALTH INSURANCE | Admitting: Cardiovascular Disease

## 2020-08-01 VITALS — BP 144/88 | HR 83 | Ht 62.0 in | Wt 289.2 lb

## 2020-08-01 DIAGNOSIS — I214 Non-ST elevation (NSTEMI) myocardial infarction: Secondary | ICD-10-CM | POA: Diagnosis not present

## 2020-08-01 DIAGNOSIS — I10 Essential (primary) hypertension: Secondary | ICD-10-CM

## 2020-08-01 DIAGNOSIS — I25119 Atherosclerotic heart disease of native coronary artery with unspecified angina pectoris: Secondary | ICD-10-CM | POA: Diagnosis not present

## 2020-08-01 DIAGNOSIS — E782 Mixed hyperlipidemia: Secondary | ICD-10-CM

## 2020-08-01 MED ORDER — LIDOCAINE 5 % EX PTCH: MEDICATED_PATCH | CUTANEOUS | 0 refills | Status: DC

## 2020-08-01 MED ORDER — CARVEDILOL 12.5 MG PO TABS: 12.5000 mg | ORAL_TABLET | Freq: Two times a day (BID) | ORAL | 1 refills | Status: DC

## 2020-08-01 NOTE — Progress Notes (Signed)
Cardiology Office Note   Date:  08/15/2020   ID:  Rhonda Roberts, DOB 1956-04-01, MRN [id number]  PCP:  Rhonda Francois, NP  Cardiologist:   Skeet Latch, MD   No chief complaint on file.    History of Present Illness: Rhonda Roberts is a 64 y.o. female with chronic systolic diastolic heart failure, CAD status post CABG, hypertension, hyperlipidemia here for follow-up.  She was initially seen in the hospital 02/2020 with an NSTEMI.  She presented with chest pain and nausea and vomiting.  High-sensitivity troponin was elevated over 3000.  She had coronary catheterization with three-vessel disease.  LVEF was 20 to 25% that admission.  She underwent four-vessel CABG while hospitalized.  The Advanced heart failure team initially saw her.  She was treated with carvedilol, spironolactone, and Entresto.  She had a repeat echocardiogram 05/2020 that revealed an improvement in her LVEF to 45 to 50%.  She was referred to cardiac rehab.  She hasn't been able to start yet.  She plans to do it virtually.  Lately Rhonda Roberts has struggled with fatigue.  She has a lot of sinus congestion.  She also has a lot of incisional discomfort that is worse at night.  She isn't getting much sleep.  She denies any chest pain with exertion.  When she last saw Dr. Aundra Roberts her BP was high so carvedilol was increased.  When she checks her blood pressure at home it typically is in the 130s to 140s.  Rosuvastatin was also increased.  She struggles with her diet and keeping her glucose controlled.  She is limiting sodium intake and denies LE edema, orthopnea or PND.  Past Medical History:  Diagnosis Date  . Diabetes mellitus without complication ([place])   . Essential hypertension 08/15/2020  . GERD (gastroesophageal reflux disease)   . Headache   . Hypertension     Past Surgical History:  Procedure Laterality Date  . ABDOMINAL HYSTERECTOMY    . CESAREAN SECTION  1987  . COLONOSCOPY WITH PROPOFOL N/A 05/08/2016    Procedure: COLONOSCOPY WITH PROPOFOL;  Surgeon: Carol Ada, MD;  Location: [facility];  Service: Endoscopy;  Laterality: N/A;  . CORONARY ARTERY BYPASS GRAFT N/A 03/06/2020   Procedure: CORONARY ARTERY BYPASS GRAFTING (CABG) using LIMA to LAD, RIMA to PLB, Left Radial Artery Graft: RAG to [place]; RAG to [place].;  Surgeon: Wonda Olds, MD;  Location: [place];  Service: Open Heart Surgery;  Laterality: N/A;  BILATERAL IMA  . LEFT HEART CATH AND CORONARY ANGIOGRAPHY N/A 03/04/2020   Procedure: LEFT HEART CATH AND CORONARY ANGIOGRAPHY;  Surgeon: Belva Crome, MD;  Location: [place] [facility];  Service: Cardiovascular;  Laterality: N/A;  . RADIAL ARTERY HARVEST Left 03/06/2020   Procedure: RADIAL ARTERY HARVEST;  Surgeon: Wonda Olds, MD;  Location: [place];  Service: Open Heart Surgery;  Laterality: Left;  . TEE WITHOUT CARDIOVERSION N/A 03/06/2020   Procedure: TRANSESOPHAGEAL ECHOCARDIOGRAM (TEE);  Surgeon: Wonda Olds, MD;  Location: [place];  Service: Open Heart Surgery;  Laterality: N/A;     Current Outpatient Medications  Medication Sig Dispense Refill  . aspirin 81 MG chewable tablet Chew 81 mg by mouth daily.    . carvedilol (COREG) 12.5 MG tablet Take 1 tablet (12.5 mg total) by mouth 2 (two) times daily with a meal. 180 tablet 1  . Cholecalciferol (VITAMIN D) 2000 UNITS CAPS Take 2,000 Units by mouth every morning.     . clopidogrel (PLAVIX)  75 MG tablet Take 1 tablet (75 mg total) by mouth daily. 90 tablet 3  . Cyanocobalamin (VITAMIN B 12 PO) Take 1 tablet by mouth daily.     . insulin NPH Human (NOVOLIN N) 100 UNIT/ML injection Inject 80 Units into the skin daily. May do additional 80 units if evening blood sugar over 150    . Multiple Vitamin (MULTIVITAMIN WITH MINERALS) TABS Take by mouth every morning.     . ondansetron (ZOFRAN) 4 MG tablet TAKE 1 TABLET BY MOUTH EVERY 8 HOURS AS NEEDED FOR NAUSEA OR VOMITING (Patient not taking: TAKE 1 TABLET BY MOUTH EVERY 8 HOURS AS  NEEDED FOR NAUSEA OR VOMITING) 20 tablet 1  . rosuvastatin (CRESTOR) 40 MG tablet Take 1 tablet (40 mg total) by mouth daily. 90 tablet 3  . sacubitril-valsartan (ENTRESTO) 49-51 MG Take 1 tablet by mouth 2 (two) times daily. 60 tablet 6  . spironolactone (ALDACTONE) 25 MG tablet Take 1 tablet (25 mg total) by mouth daily. 30 tablet 6  . lidocaine (LIDODERM) 5 % APPLY TO ARM AND CHEST AT NIGHT AS NEEDED Remove & Discard patch within 12 hours (Patient not taking: Reported on 08/12/2020) 30 patch 0   No current facility-administered medications for this visit.    Allergies:   Sulfa antibiotics    Social History:  The patient  reports that she quit smoking about 15 years ago. She has never used smokeless tobacco. She reports that she does not drink alcohol and does not use drugs.   Family History:  The patient's Family history is unknown by patient.    ROS:  Please see the history of present illness.   Otherwise, review of systems are positive for none.   All other systems are reviewed and negative.    PHYSICAL EXAM: VS:  BP (!) 144/88   Pulse 83   Ht 5\' 2"  (1.575 m)   Wt 289 lb 3.2 oz (131.2 kg)   SpO2 100%   BMI 52.90 kg/m  , BMI Body mass index is 52.9 kg/m. GENERAL:  Well appearing HEENT:  Pupils equal round and reactive, fundi not visualized, oral mucosa unremarkable NECK:  No jugular venous distention, waveform within normal limits, carotid upstroke brisk and symmetric, no bruits, no thyromegaly LYMPHATICS:  No cervical adenopathy LUNGS:  Clear to auscultation bilaterally HEART:  RRR.  PMI not displaced or sustained,S1 and S2 within normal limits, no S3, no S4, no clicks, no rubs, II/VI systolic murmur at the LUSB ABD:  Flat, positive bowel sounds normal in frequency in pitch, no bruits, no rebound, no guarding, no midline pulsatile mass, no hepatomegaly, no splenomegaly EXT:  2 plus pulses throughout, no edema, no cyanosis no clubbing SKIN:  No rashes no nodules NEURO:   Cranial nerves II through XII grossly intact, motor grossly intact throughout PSYCH:  Cognitively intact, oriented to person place and time   EKG:  EKG is ordered today. The ekg ordered today demonstrates sinus rhythm.  Rate is 83 bpm.  Prior anteroseptal infarct.   Recent Labs: 03/05/2020: TSH 0.870 03/07/2020: Magnesium 2.9 03/25/2020: B Natriuretic Peptide 597.6 04/02/2020: ALT 31; Hemoglobin 10.7; Platelets 370 06/17/2020: BUN 28; Creatinine, Ser 1.30; Potassium 4.8; Sodium 137    Lipid Panel    Component Value Date/Time   CHOL 131 06/03/2020 1022   TRIG 88 06/03/2020 1022   HDL 37 (L) 06/03/2020 1022   CHOLHDL 3.5 06/03/2020 1022   CHOLHDL 6.0 03/02/2020 0355   VLDL 21 03/02/2020 0355  LDLCALC 77 06/03/2020 1022      Wt Readings from Last 3 Encounters:  08/01/20 289 lb 3.2 oz (131.2 kg)  06/17/20 (!) 284 lb 9.6 oz (129.1 kg)  04/29/20 281 lb (127.5 kg)      ASSESSMENT AND PLAN:  # CAD s/p CABG:  # NSTEMI: # Hyperlipidemia:  Doing well but has chest discomfort.  We will try lidoderm patches to chest and arm.  BP uncontrolled.  Increase carvedilol to 12.5mg  bid.  Continue aspirin, rosuvastatin, and clopidigrel.  # Chronic systolic diastolic heart failure: # Essential hypertension: LVEF improved from 25-30% to 45-50%.  She is euvolemic.  Increase carvedilol as above.  Continue Entresto and spironolactone.  She saw HF team and was told to follow up in 3 months.  Will clarify whether she is to follow with [place] or HF in the future.  # DM:  Given her reduced ejection fraction she would be a good candidate for an SGLT2 inhibitor.  Given that she is on insulin we will not start at this time.    Current medicines are reviewed at length with the patient today.  The patient does not have concerns regarding medicines.  The following changes have been made:  Increase carvedilol  Labs/ tests ordered today include:   Orders Placed This Encounter  Procedures  . EKG 12-Lead       Disposition:   FU with either me or HFS in 6 months     Signed, Seylah Wernert C. Oval Linsey, MD, [facility]  08/15/2020 10:47 AM    [place]

## 2020-08-01 NOTE — Patient Instructions (Signed)
Medication Instructions:  INCREASE YOUR CARVEDILOL TO 12.5 MG TWICE A DAY  USE LIDODERM PATCH APPLY TO CHEST AND ARM AT BEDTIME AS NEEDED   *If you need a refill on your cardiac medications before your next appointment, please call your pharmacy*  Lab Work: NONE  Testing/Procedures: NONE  Follow-Up: WILL DISCUSS WITH DR [facility] TO DETERMINE FOLLOW UP

## 2020-08-02 ENCOUNTER — Telehealth: Payer: Self-pay | Admitting: Cardiovascular Disease

## 2020-08-02 NOTE — Telephone Encounter (Signed)
We are recommending the COVID-19 vaccine to all of our patients. Cardiac medications (including blood thinners) should not deter anyone from being vaccinated and there is no need to hold any of those medications prior to vaccine administration.     Currently, there is a hotline to call (active 12/01/19) to schedule vaccination appointments as no walk-ins will be accepted.   Number: [phone].    If an appointment is not available please go to [email or web address] to sign up for notification when additional vaccine appointments are available.   If you have further questions or concerns about the vaccine process, please visit [email or web address] or contact your primary care physician.   

## 2020-08-07 ENCOUNTER — Telehealth (HOSPITAL_COMMUNITY): Payer: Self-pay

## 2020-08-07 NOTE — Telephone Encounter (Signed)
Called and spoke to pt in regards to rescheduling for CR, pt will come in for orientation on 08/22/20 @ 930AM and will do the VCR program.  Mailed letter

## 2020-08-08 MED FILL — SPIRONOLACTONE 25 MG TABS: 25 | 30 days supply | Qty: 30 | Fill #4

## 2020-08-08 MED FILL — CLOPIDOGREL 75 MG TABLET: 75 | 30 days supply | Qty: 30 | Fill #4

## 2020-08-12 ENCOUNTER — Telehealth (HOSPITAL_COMMUNITY): Payer: Self-pay | Admitting: Student-PharmD

## 2020-08-12 NOTE — Telephone Encounter (Signed)
Cardiac Rehab Medication Review by a Pharmacist  Does the patient  feel that his/her medications are working for him/her?  yes  Has the patient been experiencing any side effects to the medications prescribed?  no   Does the patient measure his/her own blood pressure or blood glucose at home?  yes   Blood pressure - has been a little high so carvedilol was recently increased to 12.5mg  BID. Can range from 120-170 / 80s. She can tell if it's high because she gets a headache and sometimes feels dizzy.  Blood glucose, fasting - 70-200 Blood glucose, evening before bed - 250  Only takes insulin in the evening when blood sugar is above 150.  Does the patient have any problems obtaining medications due to transportation or finances?   No - free with pharmacy  Understanding of regimen: good Understanding of indications: good Potential of compliance: good  Mercy Riding, PharmD PGY1 Acute Care Pharmacy Resident Please refer to [facility] for unit-specific pharmacist

## 2020-08-14 ENCOUNTER — Other Ambulatory Visit: Payer: Self-pay | Admitting: Cardiovascular Disease

## 2020-08-14 MED FILL — CARVEDILOL 6.25 MG TABLET: 6.25 | 30 days supply | Qty: 60 | Fill #1

## 2020-08-14 NOTE — Telephone Encounter (Signed)
*  STAT* If patient is at the pharmacy, call can be transferred to refill team.   1. Which medications need to be refilled? (please list name of each medication and dose if known) carvedilol (COREG) 12.5 MG tablet  2. Which pharmacy/location (including street and city if local pharmacy) is medication to be sent to? [place], [place].  3. Do they need a 30 day or 90 day supply? [place]

## 2020-08-15 ENCOUNTER — Encounter: Payer: Self-pay | Admitting: Cardiovascular Disease

## 2020-08-15 DIAGNOSIS — I10 Essential (primary) hypertension: Secondary | ICD-10-CM

## 2020-08-15 HISTORY — DX: Essential (primary) hypertension: I10

## 2020-08-19 ENCOUNTER — Other Ambulatory Visit (HOSPITAL_COMMUNITY): Payer: PRIVATE HEALTH INSURANCE

## 2020-08-20 ENCOUNTER — Encounter (HOSPITAL_COMMUNITY)
Admission: RE | Admit: 2020-08-20 | Discharge: 2020-08-20 | Disposition: A | Discharge status: Home / Self Care | Payer: PRIVATE HEALTH INSURANCE | Source: Ambulatory Visit | Attending: Cardiology | Admitting: Cardiology

## 2020-08-20 ENCOUNTER — Other Ambulatory Visit: Payer: Self-pay

## 2020-08-20 DIAGNOSIS — Z951 Presence of aortocoronary bypass graft: Secondary | ICD-10-CM

## 2020-08-20 DIAGNOSIS — I214 Non-ST elevation (NSTEMI) myocardial infarction: Secondary | ICD-10-CM

## 2020-08-20 NOTE — Progress Notes (Signed)
Cardiac Rehab Telephone Note:  Successful telephone encounter to [name] to confirm Cardiac Rehab orientation appointment for 08/22/20 at 9:30 am. Nursing assessment previously completed and reviewed for any changes today. Patient questions answered. Instructions for appointment provided. Patient screening for Covid-19 negative. Patient previously consented to participation in [place] utilizing the [employer] VCR app.   Alexyss Balzarini E. Rollene Rotunda RN, BSN [place]. [facility]  Cardiac and Pulmonary Rehabilitation Phone: [phone] Fax: [phone]

## 2020-08-22 ENCOUNTER — Telehealth (HOSPITAL_COMMUNITY): Payer: Self-pay

## 2020-08-22 ENCOUNTER — Ambulatory Visit (HOSPITAL_COMMUNITY): Payer: PRIVATE HEALTH INSURANCE

## 2020-08-22 NOTE — Telephone Encounter (Signed)
Cardiac Rehab Note:  Incoming telephone call from [name]. Rhonda Roberts sounds extremely hoarse. States she is running a temp of 103 and has symptoms of a sinus infection. She is scheduled for cardiac rehab orientation today at 9:30 which will be rescheduled. She is strongly encouraged to contact her PCP ASAP for additional assessment and instructions. Rhonda Roberts states she is fully vaccinated against Covid-19. She is informed that with her self described symptoms and knowing risk of breakthrough infection, she should strongly consider testing for Covid-19 infection. Patient verbalizes understanding.  Plan: Will contact patient over the next 2 weeks for CR orientation rescheduling.  Lavar Rosenzweig E. Rollene Rotunda RN, BSN [place]. [facility]  Cardiac and Pulmonary Rehabilitation Phone: [phone] Fax: [phone]

## 2020-08-26 ENCOUNTER — Telehealth (HOSPITAL_COMMUNITY): Payer: Self-pay

## 2020-08-26 NOTE — Telephone Encounter (Signed)
Called and spoke to pt in regards to CR, pt stated she tested neg. For COVID-19 at [facility]. Patient will come in for orientation on 08/29/20 @ 8AM and will attend the VCR only.

## 2020-08-29 ENCOUNTER — Other Ambulatory Visit: Payer: Self-pay

## 2020-08-29 ENCOUNTER — Encounter (HOSPITAL_COMMUNITY): Payer: Self-pay

## 2020-08-29 ENCOUNTER — Encounter (HOSPITAL_COMMUNITY)
Admission: RE | Admit: 2020-08-29 | Discharge: 2020-08-29 | Disposition: A | Discharge status: Home / Self Care | Payer: PRIVATE HEALTH INSURANCE | Source: Ambulatory Visit | Attending: Cardiology | Admitting: Cardiology

## 2020-08-29 VITALS — BP 138/70 | HR 97 | Ht 61.34 in | Wt 297.4 lb

## 2020-08-29 DIAGNOSIS — Z951 Presence of aortocoronary bypass graft: Secondary | ICD-10-CM | POA: Insufficient documentation

## 2020-08-29 DIAGNOSIS — I214 Non-ST elevation (NSTEMI) myocardial infarction: Secondary | ICD-10-CM | POA: Insufficient documentation

## 2020-08-29 HISTORY — DX: Hyperlipidemia, unspecified: E78.5

## 2020-08-29 HISTORY — DX: Atherosclerotic heart disease of native coronary artery without angina pectoris: I25.10

## 2020-08-29 NOTE — Progress Notes (Signed)
I have reviewed a Home Exercise Prescription with [name] . Rhonda Roberts is currently exercising at home. The patient was advised to continue to walk 3 days a week for 15-20 minutes. Barnett Applebaum and I discussed how to progress their exercise prescription. Pt will work to gradually increase walking time to 30 minutes. Pt has stationary bike and hand weights at home and will use bike when she does not go walking.The patient stated that they understand the exercise prescription. We reviewed exercise guidelines, target heart rate during exercise, RPE Scale, weather conditions, NTG use, endpoints for exercise, warmup and cool down. Patient is encouraged to come to me with any questions. I will continue to follow up with the patient to assist them with progression and safety.    Carma Lair MS, CEP CSCS 12:20 PM 08/29/2020

## 2020-08-29 NOTE — Progress Notes (Signed)
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  aspirin 81 MG chewable tablet, Chew 81 mg by mouth daily., Disp: , Rfl:  .  carvedilol (COREG) 12.5 MG tablet, Take 1 tablet (12.5 mg total) by mouth 2 (two) times daily with a meal., Disp: 180 tablet, Rfl: 1 .  Cholecalciferol (VITAMIN D) 2000 UNITS CAPS, Take 2,000 Units by mouth every morning. , Disp: , Rfl:  .  clopidogrel (PLAVIX) 75 MG tablet, Take 1 tablet (75 mg total) by mouth daily., Disp: 90 tablet, Rfl: 3 .  Cyanocobalamin (VITAMIN B 12 PO), Take 1 tablet by mouth daily. , Disp: , Rfl:  .  insulin NPH Human (NOVOLIN N) 100 UNIT/ML injection, Inject 80 Units into the skin daily. May do additional 80 units if evening blood sugar over 150, Disp: , Rfl:  .  lidocaine (LIDODERM) 5 %, APPLY TO ARM AND CHEST AT NIGHT AS NEEDED Remove & Discard patch within 12 hours (Patient not taking: Reported on 08/12/2020), Disp: 30 patch, Rfl: 0 .  Multiple Vitamin (MULTIVITAMIN WITH MINERALS) TABS, Take by mouth every morning. , Disp: , Rfl:  .  ondansetron (ZOFRAN) 4 MG tablet, TAKE 1 TABLET BY MOUTH EVERY 8 HOURS AS NEEDED FOR NAUSEA OR VOMITING (Patient not taking: TAKE 1 TABLET BY MOUTH EVERY 8 HOURS AS NEEDED FOR NAUSEA OR VOMITING), Disp: 20 tablet, Rfl: 1 .  rosuvastatin (CRESTOR) 40 MG tablet, Take 1 tablet (40 mg total) by mouth daily., Disp: 90 tablet, Rfl: 3 .  sacubitril-valsartan (ENTRESTO)  49-51 MG, Take 1 tablet by mouth 2 (two) times daily., Disp: 60 tablet, Rfl: 6 .  spironolactone (ALDACTONE) 25 MG tablet, Take 1 tablet (25 mg total) by mouth daily., Disp: 30 tablet, Rfl: 6  Past Medical History: Past Medical History:  Diagnosis Date  . Coronary artery disease   . Diabetes mellitus without complication ([place])   . Essential hypertension 08/15/2020  . GERD (gastroesophageal reflux disease)   . Headache   . Hyperlipidemia   . Hypertension     Tobacco Use: Social History   Tobacco Use  Smoking Status Former Smoker  . Quit date: 11/23/2004  . Years since quitting: 15.7  Smokeless Tobacco Never Used    Labs: Recent Chemical engineer    Labs for ITP Cardiac and Pulmonary Rehab Latest Ref Rng & Units 04/29/2020 04/29/2020 04/29/2020 04/29/2020 06/03/2020   Cholestrol 100 - 199 mg/dL - - - - 131   LDLCALC 0 - 99 mg/dL - - - - 77   HDL >39 mg/dL - - - - 37(L)   Trlycerides 0 - 149 mg/dL - - - - 88   Hemoglobin A1c 4.0 - 5.6 % 8.4(A) 8.4 8.4(A) 8.4(A) -   PHART 7.35 - 7.45 - - - - -   PCO2ART 32 - 48 mmHg - - - - -   HCO3 20.0 - 28.0 mmol/L - - - - -   TCO2 22 -  32 mmol/L - - - - -   ACIDBASEDEF 0.0 - 2.0 mmol/L - - - - -   O2SAT % - - - - -      Capillary Blood Glucose: Lab Results  Component Value Date   GLUCAP 129 (H) 03/15/2020   GLUCAP 99 03/15/2020   GLUCAP 99 03/15/2020   GLUCAP 185 (H) 03/14/2020   GLUCAP 165 (H) 03/14/2020     Exercise Target Goals: Exercise Program Goal: Individual exercise prescription set using results from initial 6 min walk test and THRR while considering  patient's activity barriers and safety.   Exercise Prescription Goal: Starting with aerobic activity 30 plus minutes a day, 3 days per week for initial exercise prescription. Provide home exercise prescription and guidelines that participant acknowledges understanding prior to discharge.  Activity Barriers & Risk Stratification:  Activity Barriers & Cardiac Risk  Stratification - 08/29/20 1206      Activity Barriers & Cardiac Risk Stratification   Activity Barriers Arthritis;Back Problems    Cardiac Risk Stratification High           6 Minute Walk:  6 Minute Walk    Row Name 08/29/20 1202         6 Minute Walk   Phase Initial     Distance 780 feet     Walk Time 6 minutes  Pt took at 70 sec rest break     # of Rest Breaks 1     MPH 1.5     METS 1.1     RPE 12     Perceived Dyspnea  1     VO2 Peak 3.8     Symptoms No     Resting HR 97 bpm     Resting BP 138/70     Resting Oxygen Saturation  98 %     Exercise Oxygen Saturation  during 6 min walk 98 %     Max Ex. HR 114 bpm     Max Ex. BP 142/80     2 Minute Post BP 132/74            Oxygen Initial Assessment:   Oxygen Re-Evaluation:   Oxygen Discharge (Final Oxygen Re-Evaluation):   Initial Exercise Prescription:  Initial Exercise Prescription - 08/29/20 1200      Date of Initial Exercise RX and Referring Provider   Date 08/29/20    Referring Provider Loralie Champagne      Recumbant Bike   Minutes 15      Track   Laps --   walking in neighborhood   Minutes 15      Prescription Details   Frequency (times per week) 3x    Duration Progress to 30 minutes of continuous aerobic without signs/symptoms of physical distress      Intensity   THRR 40-80% of Max Heartrate 62-125    Ratings of Perceived Exertion 11-13    Perceived Dyspnea 0-4      Progression   Progression Continue progressive overload as per policy without signs/symptoms or physical distress.      Resistance Training   Training Prescription Yes    Weight 2lbs    Reps 10-15           Perform Capillary Blood Glucose checks as needed.  Exercise Prescription Changes:   Exercise Comments:   Exercise Comments    Row Name 08/29/20 1219           Exercise Comments Reviewed Home Exercise Plan with pt. Pt is  walking 15 minutes 3x day and has access to home stationary bike and handweights.               Exercise Goals and Review:   Exercise Goals    Row Name 08/29/20 1207             Exercise Goals   Increase Physical Activity Yes       Intervention Provide advice, education, support and counseling about physical activity/exercise needs.;Develop an individualized exercise prescription for aerobic and resistive training based on initial evaluation findings, risk stratification, comorbidities and participant's personal goals.       Expected Outcomes Short Term: Attend rehab on a regular basis to increase amount of physical activity.;Long Term: Add in home exercise to make exercise part of routine and to increase amount of physical activity.;Long Term: Exercising regularly at least 3-5 days a week.       Increase Strength and Stamina Yes       Intervention Provide advice, education, support and counseling about physical activity/exercise needs.;Develop an individualized exercise prescription for aerobic and resistive training based on initial evaluation findings, risk stratification, comorbidities and participant's personal goals.       Expected Outcomes Short Term: Increase workloads from initial exercise prescription for resistance, speed, and METs.;Short Term: Perform resistance training exercises routinely during rehab and add in resistance training at home;Long Term: Improve cardiorespiratory fitness, muscular endurance and strength as measured by increased METs and functional capacity (6MWT)       Able to understand and use rate of perceived exertion (RPE) scale Yes       Intervention Provide education and explanation on how to use RPE scale       Expected Outcomes Short Term: Able to use RPE daily in rehab to express subjective intensity level;Long Term:  Able to use RPE to guide intensity level when exercising independently       Knowledge and understanding of Target Heart Rate Range (THRR) Yes       Intervention Provide education and explanation of THRR including how the  numbers were predicted and where they are located for reference       Expected Outcomes Short Term: Able to state/look up THRR;Long Term: Able to use THRR to govern intensity when exercising independently;Short Term: Able to use daily as guideline for intensity in rehab       Able to check pulse independently Yes       Intervention Provide education and demonstration on how to check pulse in carotid and radial arteries.;Review the importance of being able to check your own pulse for safety during independent exercise       Expected Outcomes Short Term: Able to explain why pulse checking is important during independent exercise;Long Term: Able to check pulse independently and accurately       Understanding of Exercise Prescription Yes       Intervention Provide education, explanation, and written materials on patient's individual exercise prescription       Expected Outcomes Short Term: Able to explain program exercise prescription;Long Term: Able to explain home exercise prescription to exercise independently              Exercise Goals Re-Evaluation :    Discharge Exercise Prescription (Final Exercise Prescription Changes):   Nutrition:  Target Goals: Understanding of nutrition guidelines, daily intake of sodium 1500mg , cholesterol 200mg , calories 30% from fat and 7% or less from saturated fats, daily to have 5 or more servings of fruits and vegetables.  Biometrics:  Pre Biometrics - 08/29/20 1205      Pre Biometrics   Height 5' 1.34" (1.558 m)    Weight 134.9 kg    Waist Circumference 50 inches    Hip Circumference 54 inches    Waist to Hip Ratio 0.93 %    BMI (Calculated) 55.57    Triceps Skinfold 50 mm    % Body Fat 63.7 %    Grip Strength 23 kg    Flexibility 0 in    Single Leg Stand 2.34 seconds            Nutrition Therapy Plan and Nutrition Goals:   Nutrition Assessments:   Nutrition Goals Re-Evaluation:   Nutrition Goals Discharge (Final Nutrition Goals  Re-Evaluation):   Psychosocial: Target Goals: Acknowledge presence or absence of significant depression and/or stress, maximize coping skills, provide positive support system. Participant is able to verbalize types and ability to use techniques and skills needed for reducing stress and depression.  Initial Review & Psychosocial Screening:  Initial Psych Review & Screening - 08/29/20 1234      Initial Review   Current issues with Current Stress Concerns    Source of Stress Concerns Chronic Illness;Family      Family Dynamics   Good Support System? Yes   Amira lives with her daughter who she has for support     Barriers   Psychosocial barriers to participate in program The patient should benefit from training in stress management and relaxation.      Screening Interventions   Interventions Encouraged to exercise;To provide support and resources with identified psychosocial needs    Expected Outcomes Long Term Goal: Stressors or current issues are controlled or eliminated.           Quality of Life Scores:  Quality of Life - 08/29/20 1208      Quality of Life   Select Quality of Life      Quality of Life Scores   Health/Function Pre 19.6 %    Socioeconomic Pre 30 %    Psych/Spiritual Pre 28.29 %    Family Pre 26.4 %    GLOBAL Pre 24.53 %          Scores of 19 and below usually indicate a poorer quality of life in these areas.  A difference of  2-3 points is a clinically meaningful difference.  A difference of 2-3 points in the total score of the Quality of Life Index has been associated with significant improvement in overall quality of life, self-image, physical symptoms, and general health in studies assessing change in quality of life.  PHQ-9: Recent Review Flowsheet Data    Depression screen [facility] 2/9 08/29/2020 06/03/2020 06/03/2020 04/29/2020 04/01/2020   Decreased Interest - - 0 0 0   Down, Depressed, Hopeless 0 1 0 1 0   PHQ - 2 Score 0 1 0 1 0   Altered sleeping - -  - 0 0   Tired, decreased energy - - - 2 0   Change in appetite - - - 0 0   Feeling bad or failure about yourself  - - - 1 0   Trouble concentrating - - - 0 0   Moving slowly or fidgety/restless - - - 0 0   Suicidal thoughts - - - 0 0   PHQ-9 Score - - - 4 0   Difficult doing work/chores - - - - Not difficult at all     Interpretation of Total Score  Total Score Depression Severity:  1-4 = Minimal depression, 5-9 = Mild depression, 10-14 = Moderate depression, 15-19 = Moderately severe depression, 20-27 = Severe depression   Psychosocial Evaluation and Intervention:   Psychosocial Re-Evaluation:   Psychosocial Discharge (Final Psychosocial Re-Evaluation):   Vocational Rehabilitation: Provide vocational rehab assistance to qualifying candidates.   Vocational Rehab Evaluation & Intervention:  Vocational Rehab - 08/29/20 1236      Initial Vocational Rehab Evaluation & Intervention   Assessment shows need for Vocational Rehabilitation No           Education: Education Goals: Education classes will be provided on a weekly basis, covering required topics. Participant will state understanding/return demonstration of topics presented.  Learning Barriers/Preferences:  Learning Barriers/Preferences - 08/29/20 1209      Learning Barriers/Preferences   Learning Barriers Sight    Learning Preferences Written Material;Verbal Instruction;Individual Instruction;Skilled Demonstration           Education Topics: Hypertension, Hypertension Reduction -Define heart disease and high blood pressure. Discus how high blood pressure affects the body and ways to reduce high blood pressure.   Exercise and Your Heart -Discuss why it is important to exercise, the FITT principles of exercise, normal and abnormal responses to exercise, and how to exercise safely.   Angina -Discuss definition of angina, causes of angina, treatment of angina, and how to decrease risk of having  angina.   Cardiac Medications -Review what the following cardiac medications are used for, how they affect the body, and side effects that may occur when taking the medications.  Medications include Aspirin, Beta blockers, calcium channel blockers, ACE Inhibitors, angiotensin receptor blockers, diuretics, digoxin, and antihyperlipidemics.   Congestive Heart Failure -Discuss the definition of CHF, how to live with CHF, the signs and symptoms of CHF, and how keep track of weight and sodium intake.   Heart Disease and Intimacy -Discus the effect sexual activity has on the heart, how changes occur during intimacy as we age, and safety during sexual activity.   Smoking Cessation / COPD -Discuss different methods to quit smoking, the health benefits of quitting smoking, and the definition of COPD.   Nutrition I: Fats -Discuss the types of cholesterol, what cholesterol does to the heart, and how cholesterol levels can be controlled.   Nutrition II: Labels -Discuss the different components of food labels and how to read food label   Heart Parts/Heart Disease and PAD -Discuss the anatomy of the heart, the pathway of blood circulation through the heart, and these are affected by heart disease.   Stress I: Signs and Symptoms -Discuss the causes of stress, how stress may lead to anxiety and depression, and ways to limit stress.   Stress II: Relaxation -Discuss different types of relaxation techniques to limit stress.   Warning Signs of Stroke / TIA -Discuss definition of a stroke, what the signs and symptoms are of a stroke, and how to identify when someone is having stroke.   Knowledge Questionnaire Score:  Knowledge Questionnaire Score - 08/29/20 1207      Knowledge Questionnaire Score   Pre Score 21/24           Core Components/Risk Factors/Patient Goals at Admission:  Personal Goals and Risk Factors at Admission - 08/29/20 1208      Core Components/Risk Factors/Patient  Goals on Admission    Weight Management Yes;Obesity    Intervention Weight Management: Develop a combined nutrition and exercise program designed to reach desired caloric intake, while maintaining appropriate intake of nutrient  and fiber, sodium and fats, and appropriate energy expenditure required for the weight goal.;Weight Management: Provide education and appropriate resources to help participant work on and attain dietary goals.;Weight Management/Obesity: Establish reasonable short term and long term weight goals.;Obesity: Provide education and appropriate resources to help participant work on and attain dietary goals.    Admit Weight 297 lb 6.4 oz (134.9 kg)    Goal Weight: Long Term 200 lb (90.7 kg)    Expected Outcomes Short Term: Continue to assess and modify interventions until short term weight is achieved;Long Term: Adherence to nutrition and physical activity/exercise program aimed toward attainment of established weight goal;Weight Loss: Understanding of general recommendations for a balanced deficit meal plan, which promotes 1-2 lb weight loss per week and includes a negative energy balance of [phone] kcal/d;Understanding recommendations for meals to include 15-35% energy as protein, 25-35% energy from fat, 35-60% energy from carbohydrates, less than 200mg  of dietary cholesterol, 20-35 gm of total fiber daily;Understanding of distribution of calorie intake throughout the day with the consumption of 4-5 meals/snacks    Diabetes Yes    Intervention Provide education about signs/symptoms and action to take for hypo/hyperglycemia.;Provide education about proper nutrition, including hydration, and aerobic/resistive exercise prescription along with prescribed medications to achieve blood glucose in normal ranges: Fasting glucose 65-99 mg/dL    Expected Outcomes Short Term: Participant verbalizes understanding of the signs/symptoms and immediate care of hyper/hypoglycemia, proper foot care and  importance of medication, aerobic/resistive exercise and nutrition plan for blood glucose control.;Long Term: Attainment of HbA1C < 7%.    Heart Failure Yes    Intervention Provide a combined exercise and nutrition program that is supplemented with education, support and counseling about heart failure. Directed toward relieving symptoms such as shortness of breath, decreased exercise tolerance, and extremity edema.    Expected Outcomes Short term: Attendance in program 2-3 days a week with increased exercise capacity. Reported lower sodium intake. Reported increased fruit and vegetable intake. Reports medication compliance.;Improve functional capacity of life;Short term: Daily weights obtained and reported for increase. Utilizing diuretic protocols set by physician.;Long term: Adoption of self-care skills and reduction of barriers for early signs and symptoms recognition and intervention leading to self-care maintenance.    Hypertension Yes    Intervention Provide education on lifestyle modifcations including regular physical activity/exercise, weight management, moderate sodium restriction and increased consumption of fresh fruit, vegetables, and low fat dairy, alcohol moderation, and smoking cessation.;Monitor prescription use compliance.    Expected Outcomes Short Term: Continued assessment and intervention until BP is < 140/25mm HG in hypertensive participants. < 130/18mm HG in hypertensive participants with diabetes, heart failure or chronic kidney disease.;Long Term: Maintenance of blood pressure at goal levels.    Lipids Yes    Intervention Provide education and support for participant on nutrition & aerobic/resistive exercise along with prescribed medications to achieve LDL 70mg , HDL >40mg .    Expected Outcomes Short Term: Participant states understanding of desired cholesterol values and is compliant with medications prescribed. Participant is following exercise prescription and nutrition  guidelines.;Long Term: Cholesterol controlled with medications as prescribed, with individualized exercise RX and with personalized nutrition plan. Value goals: LDL < 70mg , HDL > 40 mg.    Stress Yes    Intervention Offer individual and/or small group education and counseling on adjustment to heart disease, stress management and health-related lifestyle change. Teach and support self-help strategies.;Refer participants experiencing significant psychosocial distress to appropriate mental health specialists for further evaluation and treatment. When possible, include family members and  significant others in education/counseling sessions.    Expected Outcomes Short Term: Participant demonstrates changes in health-related behavior, relaxation and other stress management skills, ability to obtain effective social support, and compliance with psychotropic medications if prescribed.;Long Term: Emotional wellbeing is indicated by absence of clinically significant psychosocial distress or social isolation.           Core Components/Risk Factors/Patient Goals Review:    Core Components/Risk Factors/Patient Goals at Discharge (Final Review):    ITP Comments:  ITP Comments    Row Name 08/29/20 0806           ITP Comments Dr. Fransico Him, Medical Director              Comments: Rhonda Roberts attended orientation on 08/29/2020 to review rules and guidelines for program.  Completed 6 minute walk test, Intitial ITP, and exercise prescription.  VSS. Telemetry-Sinus Rhythm.  Asymptomatic. Safety measures and social distancing in place per CDC guidelines. Rhonda Roberts was able to download the virtual better hearts APP but was unable to complete the process during orientation. Will call the patient to follow up regarding downloading the virtual cardiac rehab APP.Rhonda Pall, RN,BSN 08/29/2020 12:46 PM

## 2020-08-30 ENCOUNTER — Encounter (HOSPITAL_COMMUNITY)
Admission: RE | Admit: 2020-08-30 | Discharge: 2020-08-30 | Disposition: A | Discharge status: Home / Self Care | Payer: PRIVATE HEALTH INSURANCE | Source: Ambulatory Visit | Attending: Cardiology | Admitting: Cardiology

## 2020-08-30 NOTE — Progress Notes (Signed)
Rhonda Roberts 64 y.o. female Nutrition Note  Diagnosis: CABGx4  Past Medical History:  Diagnosis Date  . Coronary artery disease   . Diabetes mellitus without complication ([place])   . Essential hypertension 08/15/2020  . GERD (gastroesophageal reflux disease)   . Headache   . Hyperlipidemia   . Hypertension      Medications reviewed.   Current Outpatient Medications:  .  aspirin 81 MG chewable tablet, Chew 81 mg by mouth daily., Disp: , Rfl:  .  carvedilol (COREG) 12.5 MG tablet, Take 1 tablet (12.5 mg total) by mouth 2 (two) times daily with a meal., Disp: 180 tablet, Rfl: 1 .  Cholecalciferol (VITAMIN D) 2000 UNITS CAPS, Take 2,000 Units by mouth every morning. , Disp: , Rfl:  .  clopidogrel (PLAVIX) 75 MG tablet, Take 1 tablet (75 mg total) by mouth daily., Disp: 90 tablet, Rfl: 3 .  Cyanocobalamin (VITAMIN B 12 PO), Take 1 tablet by mouth daily. , Disp: , Rfl:  .  insulin NPH Human (NOVOLIN N) 100 UNIT/ML injection, Inject 80 Units into the skin daily. May do additional 80 units if evening blood sugar over 150, Disp: , Rfl:  .  lidocaine (LIDODERM) 5 %, APPLY TO ARM AND CHEST AT NIGHT AS NEEDED Remove & Discard patch within 12 hours (Patient not taking: Reported on 08/12/2020), Disp: 30 patch, Rfl: 0 .  Multiple Vitamin (MULTIVITAMIN WITH MINERALS) TABS, Take by mouth every morning. , Disp: , Rfl:  .  ondansetron (ZOFRAN) 4 MG tablet, TAKE 1 TABLET BY MOUTH EVERY 8 HOURS AS NEEDED FOR NAUSEA OR VOMITING (Patient not taking: TAKE 1 TABLET BY MOUTH EVERY 8 HOURS AS NEEDED FOR NAUSEA OR VOMITING), Disp: 20 tablet, Rfl: 1 .  rosuvastatin (CRESTOR) 40 MG tablet, Take 1 tablet (40 mg total) by mouth daily., Disp: 90 tablet, Rfl: 3 .  sacubitril-valsartan (ENTRESTO) 49-51 MG, Take 1 tablet by mouth 2 (two) times daily., Disp: 60 tablet, Rfl: 6 .  spironolactone (ALDACTONE) 25 MG tablet, Take 1 tablet (25 mg total) by mouth daily., Disp: 30 tablet, Rfl: 6   Ht Readings from Last 1  Encounters:  08/29/20 5' 1.34" (1.558 m)     Wt Readings from Last 3 Encounters:  08/29/20 297 lb 6.4 oz (134.9 kg)  08/01/20 289 lb 3.2 oz (131.2 kg)  06/17/20 (!) 284 lb 9.6 oz (129.1 kg)     There is no height or weight on file to calculate BMI.   Social History   Tobacco Use  Smoking Status Former Smoker  . Quit date: 11/23/2004  . Years since quitting: 15.7  Smokeless Tobacco Never Used     Lab Results  Component Value Date   CHOL 131 06/03/2020   Lab Results  Component Value Date   HDL 37 (L) 06/03/2020   Lab Results  Component Value Date   LDLCALC 77 06/03/2020   Lab Results  Component Value Date   TRIG 88 06/03/2020     Lab Results  Component Value Date   HGBA1C 8.4 (A) 04/29/2020   HGBA1C 8.4 04/29/2020   HGBA1C 8.4 (A) 04/29/2020   HGBA1C 8.4 (A) 04/29/2020     CBG (last 3)  No results for input(s): GLUCAP in the last 72 hours.   Nutrition Note  Spoke with pt. Nutrition Plan and Nutrition Survey goals reviewed with pt. Pt is trying to follow a heart healthy diet. She reads labels.  She tries to choose largest meal for lunch and smaller for  breakfast and dinner. No red meat or deep fried foods.  Pt has Type 2 Diabetes. Last A1c indicates blood glucose well-controlled. Pt checks CBG's 2 times a day. Fasting CBG's reportedly 80-135 mg/dL. She eats 45 g carbs per meal, 30 g for day time snack and 15 g for pm snack.  She takes 80 units insulin at night. She is not taking am insulin unless her fasting CBG >150 mg/dl. She reports CBGs >200 mg/dl by evening.   Reviewed cholesterol panel and benefits of healthy fats and fiber. Pt with dx of CHF. Per discussion, pt does use canned/convenience foods often. Pt does add salt to food. Pt does not eat out frequently.   Pt expressed understanding of the information reviewed.   Nutrition Diagnosis ? Excessive carbohydrate intake related to consumption of refined grained as evidenced by A1C 8.4  Nutrition  Intervention ? Pt's individual nutrition plan reviewed with pt. ? Benefits of adopting Heart Healthy diet discussed when Medficts reviewed.   ? Continue client-centered nutrition education by RD, as part of interdisciplinary care.  Goal(s) ? Pt to build a healthy plate including vegetables, fruits, whole grains, and low-fat dairy products in a heart healthy meal plan. ? CBG concentrations in the normal range or as close to normal as is safely possible. ? Improved blood glucose control as evidenced by pt's A1c trending from 8.4  toward less than 7.0. ? Pt to incorporate whole grains  Plan:   Will provide client-centered nutrition education as part of interdisciplinary care  Monitor and evaluate progress toward nutrition goal with team.   Michaele Offer, MS, RDN, LDN

## 2020-09-02 ENCOUNTER — Other Ambulatory Visit (HOSPITAL_COMMUNITY): Payer: PRIVATE HEALTH INSURANCE

## 2020-09-09 MED FILL — CLOPIDOGREL 75 MG TABLET: 75 | 30 days supply | Qty: 30 | Fill #5

## 2020-09-09 MED FILL — CARVEDILOL 6.25 MG TABLET: 6.25 | 30 days supply | Qty: 60 | Fill #2

## 2020-09-09 MED FILL — SPIRONOLACTONE 25 MG TABS: 25 | 30 days supply | Qty: 30 | Fill #5

## 2020-09-10 ENCOUNTER — Ambulatory Visit (HOSPITAL_COMMUNITY)
Admission: RE | Admit: 2020-09-10 | Discharge: 2020-09-10 | Disposition: A | Discharge status: Home / Self Care | Payer: PRIVATE HEALTH INSURANCE | Source: Ambulatory Visit | Attending: Adult Health | Admitting: Adult Health

## 2020-09-10 ENCOUNTER — Other Ambulatory Visit: Payer: Self-pay

## 2020-09-10 DIAGNOSIS — E785 Hyperlipidemia, unspecified: Secondary | ICD-10-CM | POA: Insufficient documentation

## 2020-09-10 LAB — LIPID PANEL
Cholesterol: 141 mg/dL (ref 0–200)
HDL: 38 mg/dL — ABNORMAL LOW (ref >40)
LDL Cholesterol: 79 mg/dL (ref 0–99)
Total CHOL/HDL Ratio: 3.7 RATIO
Triglycerides: 121 mg/dL (ref <150)
VLDL: 24 mg/dL (ref 0–40)

## 2020-09-10 LAB — HEPATIC FUNCTION PANEL
ALT: 17 U/L (ref 0–44)
AST: 19 U/L (ref 15–41)
Albumin: 3.8 g/dL (ref 3.5–5.0)
Alkaline Phosphatase: 84 U/L (ref 38–126)
Bilirubin, Direct: 0.1 mg/dL (ref 0.0–0.2)
Indirect Bilirubin: 0.6 mg/dL (ref 0.3–0.9)
Total Bilirubin: 0.7 mg/dL (ref 0.3–1.2)
Total Protein: 8.2 g/dL — ABNORMAL HIGH (ref 6.5–8.1)

## 2020-09-13 ENCOUNTER — Encounter (HOSPITAL_COMMUNITY): Payer: PRIVATE HEALTH INSURANCE

## 2020-09-13 ENCOUNTER — Telehealth (HOSPITAL_COMMUNITY): Payer: Self-pay

## 2020-09-23 ENCOUNTER — Encounter (HOSPITAL_COMMUNITY): Payer: PRIVATE HEALTH INSURANCE | Admitting: Cardiology

## 2020-09-30 ENCOUNTER — Encounter (HOSPITAL_COMMUNITY): Payer: Self-pay | Admitting: *Deleted

## 2020-09-30 NOTE — Progress Notes (Signed)
Rhonda Roberts is not active in the [facility] app. Patient has not responded to multiple telephone messages or in-app messages. Will discharge from the virtual cardiac rehab program. Patient's virtual cardiac rehab report will be scanned to media for review. Sol Passer, MS, ACSM CEP

## 2020-10-07 MED FILL — CARVEDILOL 6.25 MG TABLET: 6.25 | 30 days supply | Qty: 60 | Fill #1

## 2020-10-07 MED FILL — SPIRONOLACTONE 25 MG TABS: 25 | 30 days supply | Qty: 30 | Fill #6

## 2020-10-07 MED FILL — CLOPIDOGREL 75 MG TABLET: 75 | 30 days supply | Qty: 30 | Fill #6

## 2020-11-06 MED FILL — SPIRONOLACTONE 25 MG TABS: 25 | 34 days supply | Qty: 34 | Fill #0

## 2020-11-06 MED FILL — CLOPIDOGREL 75 MG TABLET: 75 | 34 days supply | Qty: 34 | Fill #0

## 2020-11-08 MED FILL — CARVEDILOL 6.25 MG TABLET: 6.25 | 30 days supply | Qty: 60 | Fill #2

## 2020-12-11 MED FILL — CLOPIDOGREL 75 MG TABLET: 75 | 34 days supply | Qty: 34 | Fill #1

## 2020-12-11 MED FILL — SPIRONOLACTONE 25 MG TABS: 25 | 34 days supply | Qty: 34 | Fill #1

## 2020-12-11 MED FILL — CARVEDILOL 6.25 MG TABLET: 6.25 | 30 days supply | Qty: 60 | Fill #3

## 2020-12-12 ENCOUNTER — Ambulatory Visit (HOSPITAL_COMMUNITY)
Admission: RE | Admit: 2020-12-12 | Discharge: 2020-12-12 | Disposition: A | Discharge status: Home / Self Care | Payer: PRIVATE HEALTH INSURANCE | Source: Ambulatory Visit | Attending: Cardiology | Admitting: Cardiology

## 2020-12-12 ENCOUNTER — Other Ambulatory Visit: Payer: Self-pay

## 2020-12-12 ENCOUNTER — Other Ambulatory Visit (HOSPITAL_COMMUNITY): Payer: Self-pay | Admitting: *Deleted

## 2020-12-12 ENCOUNTER — Encounter (HOSPITAL_COMMUNITY): Payer: Self-pay | Admitting: Cardiology

## 2020-12-12 VITALS — BP 145/78 | HR 88

## 2020-12-12 DIAGNOSIS — I5022 Chronic systolic (congestive) heart failure: Secondary | ICD-10-CM

## 2020-12-12 DIAGNOSIS — I25119 Atherosclerotic heart disease of native coronary artery with unspecified angina pectoris: Secondary | ICD-10-CM

## 2020-12-12 DIAGNOSIS — I5042 Chronic combined systolic (congestive) and diastolic (congestive) heart failure: Secondary | ICD-10-CM | POA: Diagnosis not present

## 2020-12-12 MED ORDER — ENTRESTO 97-103 MG PO TABS
1.0000 | ORAL_TABLET | Freq: Two times a day (BID) | ORAL | 6 refills | Status: DC
Start: 2020-12-12 — End: 2020-12-12

## 2020-12-12 MED ORDER — ENTRESTO 97-103 MG PO TABS: 1.0000 | ORAL_TABLET | Freq: Two times a day (BID) | ORAL | 6 refills | Status: DC

## 2020-12-12 NOTE — Progress Notes (Signed)
Orders placed per Dr Aundra Dubin, AVS sent via [place], message to schedulers to arrange labs and f/u

## 2020-12-12 NOTE — Patient Instructions (Signed)
Increase Entresto to 97/103 mg Twice daily   Labs needed soon and again in 3 weeks, our office will call you to schedule this  Your physician recommends that you schedule a follow-up appointment in: 3 months, our office will call you to schedule this  If you have any questions or concerns before your next appointment please send [place] a message through [place] or call our office at [phone].    TO LEAVE A MESSAGE FOR THE NURSE SELECT OPTION 2, PLEASE LEAVE A MESSAGE INCLUDING: . YOUR NAME . DATE OF BIRTH . CALL BACK NUMBER . REASON FOR CALL**this is important as we prioritize the call backs  [place] AS LONG AS YOU CALL BEFORE 4:00 PM  At the [facility], you and your health needs are our priority. As part of our continuing mission to provide you with exceptional heart care, we have created designated Provider Care Teams. These Care Teams include your primary Cardiologist (physician) and Advanced Practice Providers (APPs- Physician Assistants and Nurse Practitioners) who all work together to provide you with the care you need, when you need it.   You may see any of the following providers on your designated Care Team at your next follow up: Marland Kitchen Dr Glori Bickers . Dr Loralie Champagne . Darrick Grinder, NP . Lyda Jester, PA . Audry Riles, PharmD   Please be sure to bring in all your medications bottles to every appointment.

## 2020-12-12 NOTE — Progress Notes (Signed)
Heart Failure TeleHealth Note  Due to national recommendations of social distancing due to [place] 19, Audio/video telehealth visit is felt to be most appropriate for this patient at this time.  See MyChart message from today for patient consent regarding telehealth for [facility].  Date:  12/12/2020   ID:  Rhonda Roberts, DOB 03/03/1956, MRN [id number]  Location: Home  Provider location: [place] Failure Type of Visit: Established patient   PCP:  Vevelyn Francois, NP  HF Cardiology: Dr. Aundra Dubin   History of Present Illness: Rhonda Roberts is a 65 y.o. female who presents via audio/video conferencing for a telehealth visit today.     she denies symptoms worrisome for COVID 19.   Patient has a history of DMII, HTN, dyslipidemia, obesity, GERD, diverticulosis, and arthritis.   Presented to [facility] ED on 03/01/2020 with N/V and intermittent chest pain. EKG with ST depression inferior and anterolatera leads. HS Trop [phone] (NSTEMI). LHC with 3 vessel disease, low EF, and elevated LVEDP.  Echo with EF 20-25%. Post cath she was acutely short of breath. Placed on bipap, nitro drip at 10 mcg, and diuresed with IV lasix. CT surgery consulted. Once tuned up she underwent CABGx4. Gradually drips stopped. HF meds started. Discharged to home with her daughter.   Echo in 7/21 showed EF up to 45-50%.   She has been doing well symptomatically.  She walks and dances for exercise, no significant exertional dyspnea.  No chest pain.  No orthopnea/PND.  No lightheadedness. BP has been running mildly elevated.    Labs (5/21): K 4.5, creatinine 1.54 Labs (7/21): LDL 77, HDL 37, K 4.8, creatinine 1.3 Labs (10/21): LDL 79, HDL 38  PMH: 1. Type 2 diabetes 2. HTN 3. Hyperlipidemia: Myalgias with atorvastatin.  4. Obesity 5. GERD 6. Diverticulosis 7. CAD: NSTEMI in 4/21.  - LHC with 3VD, CABG with LIMA-LAD, pedicled RIMA-PDA, sequential left radial to OM and D.  8. Chronic  systolic CHF: Ischemic cardiomyopathy.  - Echo (4/21): EF 20-25%.   - Echo (7/21): EF 45-50%, basal-mid inferior hypokinesis, normal RV.   ROS: All systems negative except as listed in HPI, PMH and Problem List.  Social History   Socioeconomic History  . Marital status: Single    Spouse name: Not on file  . Number of children: Not on file  . Years of education: 61  . Highest education level: Not on file  Occupational History  . Occupation: retired  Tobacco Use  . Smoking status: Former Smoker    Quit date: 11/23/2004    Years since quitting: 16.0  . Smokeless tobacco: Never Used  Vaping Use  . Vaping Use: Never used  Substance and Sexual Activity  . Alcohol use: No  . Drug use: No  . Sexual activity: Not on file  Other Topics Concern  . Not on file  Social History Narrative  . Not on file   Social Determinants of Health   Financial Resource Strain: Not on file  Food Insecurity: Not on file  Transportation Needs: Not on file  Physical Activity: Not on file  Stress: Not on file  Social Connections: Not on file  Intimate Partner Violence: Not on file    FH: No premature CAD.    Current Outpatient Medications  Medication Sig Dispense Refill  . aspirin 81 MG chewable tablet Chew 81 mg by mouth daily.    . carvedilol (COREG) 12.5 MG tablet Take 1 tablet (12.5 mg total) by mouth 2 (  two) times daily with a meal. 180 tablet 1  . Cholecalciferol (VITAMIN D) 2000 UNITS CAPS Take 2,000 Units by mouth every morning.     . clopidogrel (PLAVIX) 75 MG tablet Take 1 tablet (75 mg total) by mouth daily. 90 tablet 3  . Cyanocobalamin (VITAMIN B 12 PO) Take 1 tablet by mouth daily.     . insulin NPH Human (NOVOLIN N) 100 UNIT/ML injection Inject 80 Units into the skin daily. May do additional 80 units if evening blood sugar over 150    . Multiple Vitamin (MULTIVITAMIN WITH MINERALS) TABS Take by mouth every morning.     . rosuvastatin (CRESTOR) 40 MG tablet Take 1 tablet (40 mg  total) by mouth daily. 90 tablet 3  . spironolactone (ALDACTONE) 25 MG tablet Take 1 tablet (25 mg total) by mouth daily. 30 tablet 6  . sacubitril-valsartan (ENTRESTO) 97-103 MG Take 1 tablet by mouth 2 (two) times daily. 60 tablet 6   No current facility-administered medications for this encounter.    Vitals:   12/12/20 1031  BP: (!) 145/78  Pulse: 88   Wt Readings from Last 3 Encounters:  08/29/20 134.9 kg (297 lb 6.4 oz)  08/01/20 131.2 kg (289 lb 3.2 oz)  06/17/20 (!) 129.1 kg (284 lb 9.6 oz)   Exam:  (Video/Tele Health Call; Exam is subjective and or/visual.) General:  Speaks in full sentences. No resp difficulty. Lungs: Normal respiratory effort with conversation.  Abdomen: Non-distended per patient report Extremities: Pt denies edema. Neuro: Alert & oriented x 3.   ASSESSMENT & PLAN: 1. CAD:  NSTEMI in 4/21, LHC with 3VD, now s/p CABG x4 with LIMA-LAD, seq radial-D/OM, RIMA-PDA.  No chest pain. - Continue ASA 81 mg daily.  - Continue Plavix until 4/22, then can stop.   - Continue Crestor.  2. Chronic Systolic CHF: Ischemic cardiomyopathy.  Echo pre-op with 4/21 EF 20-25%.  Echo in 7/21 showed EF up to 45-50%. NYHA class II symptoms.  - Increase Entresto to 97/103 bid.  BMET this week, then repeat in about 3 wks on higher Entresto dose.    - Continue spironolactone 25 qd. - Continue Coreg to 6.25 mg bid.   - EF is out of ICD range.  3. CKD: Stage IIIa.  - Check BMET this week.  4. HLD: Myalgias with atorvastatin, now on Crestor 40 mg daily with no problems.  LDL close to goal (<70).     COVID screen The patient does not have any symptoms that suggest any further testing/ screening at this time.  Social distancing reinforced today.  Patient Risk: After full review of this patients clinical status, I feel that they are at moderate risk for cardiac decompensation at this time.  Relevant cardiac medications were reviewed at length with the patient today. The patient  does not have concerns regarding their medications at this time.   Recommended follow-up:  In 4/22.   Today, I have spent 17 minutes with the patient with telehealth technology discussing the above issues .    Signed, Loralie Champagne, MD  12/12/2020  [facility] [address] [facility] and [place] [address] [phone] (office) [phone] (fax)

## 2020-12-13 ENCOUNTER — Other Ambulatory Visit (HOSPITAL_COMMUNITY): Payer: PRIVATE HEALTH INSURANCE

## 2020-12-13 MED ORDER — ENTRESTO 97-103 MG PO TABS: 1.0000 | ORAL_TABLET | Freq: Two times a day (BID) | ORAL | 3 refills | Status: DC

## 2020-12-13 NOTE — Addendum Note (Signed)
Encounter addended by: Scarlette Calico, RN on: 12/13/2020 11:24 AM  Actions taken: Pharmacy for encounter modified, Order list changed

## 2020-12-16 ENCOUNTER — Other Ambulatory Visit: Payer: Self-pay

## 2020-12-16 ENCOUNTER — Ambulatory Visit (HOSPITAL_COMMUNITY)
Admission: RE | Admit: 2020-12-16 | Discharge: 2020-12-16 | Disposition: A | Discharge status: Home / Self Care | Payer: Medicare HMO | Source: Ambulatory Visit | Attending: Internal Medicine | Admitting: Internal Medicine

## 2020-12-16 DIAGNOSIS — I5042 Chronic combined systolic (congestive) and diastolic (congestive) heart failure: Secondary | ICD-10-CM | POA: Diagnosis not present

## 2020-12-16 LAB — BASIC METABOLIC PANEL
Anion gap: 12 (ref 5–15)
BUN: 18 mg/dL (ref 8–23)
CO2: 23 mmol/L (ref 22–32)
Calcium: 9.4 mg/dL (ref 8.9–10.3)
Chloride: 105 mmol/L (ref 98–111)
Creatinine, Ser: 1.1 mg/dL — ABNORMAL HIGH (ref 0.44–1.00)
GFR, Estimated: 56 mL/min — ABNORMAL LOW (ref >60)
Glucose, Bld: 142 mg/dL — ABNORMAL HIGH (ref 70–99)
Potassium: 4.3 mmol/L (ref 3.5–5.1)
Sodium: 140 mmol/L (ref 135–145)

## 2021-01-02 ENCOUNTER — Other Ambulatory Visit (HOSPITAL_COMMUNITY): Payer: PRIVATE HEALTH INSURANCE

## 2021-01-06 ENCOUNTER — Ambulatory Visit (HOSPITAL_COMMUNITY)
Admission: RE | Admit: 2021-01-06 | Discharge: 2021-01-06 | Disposition: A | Discharge status: Home / Self Care | Payer: Medicare HMO | Source: Ambulatory Visit | Attending: Internal Medicine | Admitting: Internal Medicine

## 2021-01-06 ENCOUNTER — Other Ambulatory Visit: Payer: Self-pay

## 2021-01-06 DIAGNOSIS — I5042 Chronic combined systolic (congestive) and diastolic (congestive) heart failure: Secondary | ICD-10-CM | POA: Diagnosis not present

## 2021-01-06 LAB — BASIC METABOLIC PANEL
Anion gap: 11 (ref 5–15)
BUN: 29 mg/dL — ABNORMAL HIGH (ref 8–23)
CO2: 22 mmol/L (ref 22–32)
Calcium: 9.4 mg/dL (ref 8.9–10.3)
Chloride: 106 mmol/L (ref 98–111)
Creatinine, Ser: 1.05 mg/dL — ABNORMAL HIGH (ref 0.44–1.00)
GFR, Estimated: 59 mL/min — ABNORMAL LOW (ref >60)
Glucose, Bld: 141 mg/dL — ABNORMAL HIGH (ref 70–99)
Potassium: 3.9 mmol/L (ref 3.5–5.1)
Sodium: 139 mmol/L (ref 135–145)

## 2021-01-13 MED FILL — CARVEDILOL 6.25 MG TABLET: 6.25 | 30 days supply | Qty: 60 | Fill #4

## 2021-01-13 MED FILL — CLOPIDOGREL 75 MG TABLET: 75 | 34 days supply | Qty: 34 | Fill #2

## 2021-01-13 MED FILL — SPIRONOLACTONE 25 MG TABS: 25 | 34 days supply | Qty: 34 | Fill #2

## 2021-02-14 ENCOUNTER — Other Ambulatory Visit (HOSPITAL_COMMUNITY): Payer: Self-pay | Admitting: Adult Health

## 2021-02-14 MED FILL — CARVEDILOL 6.25 MG TABLET: 6.25 | 30 days supply | Qty: 60 | Fill #5

## 2021-02-17 ENCOUNTER — Other Ambulatory Visit (HOSPITAL_COMMUNITY): Payer: Self-pay

## 2021-02-17 ENCOUNTER — Other Ambulatory Visit (HOSPITAL_COMMUNITY): Payer: Self-pay | Admitting: Cardiology

## 2021-02-17 MED ORDER — SPIRONOLACTONE 25 MG PO TABS: 25.0000 mg | ORAL_TABLET | Freq: Every day | ORAL | 2 refills | Status: DC

## 2021-02-17 MED ORDER — CLOPIDOGREL BISULFATE 75 MG PO TABS: 75.0000 mg | ORAL_TABLET | Freq: Every day | ORAL | 2 refills | Status: DC

## 2021-02-17 MED FILL — SPIRONOLACTONE 25 MG TABLET: 25 | 30 days supply | Qty: 30 | Fill #0

## 2021-02-17 MED FILL — CLOPIDOGREL 75 MG TABLET: 75 | 30 days supply | Qty: 30 | Fill #0

## 2021-02-22 ENCOUNTER — Other Ambulatory Visit (HOSPITAL_COMMUNITY): Payer: Self-pay

## 2021-02-24 ENCOUNTER — Encounter (HOSPITAL_COMMUNITY): Payer: PRIVATE HEALTH INSURANCE | Admitting: Cardiology

## 2021-02-28 ENCOUNTER — Encounter (HOSPITAL_COMMUNITY): Payer: PRIVATE HEALTH INSURANCE | Admitting: Cardiology

## 2021-03-03 ENCOUNTER — Telehealth (HOSPITAL_COMMUNITY): Payer: Self-pay | Admitting: Pharmacy Technician

## 2021-03-03 ENCOUNTER — Other Ambulatory Visit (HOSPITAL_COMMUNITY): Payer: Self-pay

## 2021-03-03 NOTE — Telephone Encounter (Signed)
Advanced Heart Failure Patient Advocate Encounter  It's time to re-enroll patient to receive medication assistance for Entresto from [employer]. The patient's current 30 day co-pay is, $47. If unaffordable, could renew assistance. Attempted to call both numbers listed, neither of which are working.  Charlann Boxer, CPhT

## 2021-03-04 ENCOUNTER — Encounter (HOSPITAL_COMMUNITY): Payer: PRIVATE HEALTH INSURANCE | Admitting: Cardiology

## 2021-03-18 ENCOUNTER — Other Ambulatory Visit: Payer: Self-pay

## 2021-03-18 ENCOUNTER — Encounter: Payer: Self-pay | Admitting: Family Medicine

## 2021-03-18 ENCOUNTER — Ambulatory Visit (INDEPENDENT_AMBULATORY_CARE_PROVIDER_SITE_OTHER): Payer: Medicare HMO | Admitting: Family Medicine

## 2021-03-18 VITALS — BP 142/72 | HR 94 | Temp 97.9°F | Ht 61.0 in | Wt 301.8 lb

## 2021-03-18 DIAGNOSIS — R059 Cough, unspecified: Secondary | ICD-10-CM | POA: Diagnosis not present

## 2021-03-18 DIAGNOSIS — R0989 Other specified symptoms and signs involving the circulatory and respiratory systems: Secondary | ICD-10-CM

## 2021-03-18 MED ORDER — AZITHROMYCIN 250 MG PO TABS: ORAL_TABLET | ORAL | 0 refills | Status: AC

## 2021-03-18 MED ORDER — BENZONATATE 100 MG PO CAPS: 100.0000 mg | ORAL_CAPSULE | Freq: Three times a day (TID) | ORAL | 0 refills | Status: AC | PRN

## 2021-03-18 NOTE — Progress Notes (Signed)
Patient [facility]    Established Patient Office Visit  Subjective:  Patient ID: Rhonda Roberts, female    DOB: 05/16/1956  Age: 65 y.o. MRN: [id number]  CC:  Chief Complaint  Patient presents with  . Cough    Coughing , itching in throat , headache , not check fever , no sob , some runny nose  started about 8 days ago. Taking otc meds not helping with coughing     HPI Rhonda Roberts is a very pleasant 65 year old female with a medical history significant for type 2 diabetes mellitus, essential hypertension, hyperlipidemia, and history of presents complaining of upper respiratory symptoms over the past several weeks without relief from over-the-counter medications.  Patient endorses cough, itchy throat, headache, runny nose, and chest congestion 2). she denies any shortness of breath, fever, or chills.  Patient was tested for COVID, results negative.  She last had Robitussin on last night without  Past Medical History:  Diagnosis Date  . Coronary artery disease   . Diabetes mellitus without complication ([place])   . Essential hypertension 08/15/2020  . GERD (gastroesophageal reflux disease)   . Headache   . Hyperlipidemia   . Hypertension     Past Surgical History:  Procedure Laterality Date  . ABDOMINAL HYSTERECTOMY    . CARDIAC CATHETERIZATION    . CESAREAN SECTION  1987  . COLONOSCOPY WITH PROPOFOL N/A 05/08/2016   Procedure: COLONOSCOPY WITH PROPOFOL;  Surgeon: Carol Ada, MD;  Location: [facility];  Service: Endoscopy;  Laterality: N/A;  . CORONARY ARTERY BYPASS GRAFT N/A 03/06/2020   Procedure: CORONARY ARTERY BYPASS GRAFTING (CABG) using LIMA to LAD, RIMA to PLB, Left Radial Artery Graft: RAG to [place]; RAG to [place].;  Surgeon: Wonda Olds, MD;  Location: [facility];  Service: Open Heart Surgery;  Laterality: N/A;  BILATERAL IMA  . LEFT HEART CATH AND CORONARY ANGIOGRAPHY N/A 03/04/2020   Procedure: LEFT HEART CATH AND CORONARY  ANGIOGRAPHY;  Surgeon: Belva Crome, MD;  Location: [facility];  Service: Cardiovascular;  Laterality: N/A;  . RADIAL ARTERY HARVEST Left 03/06/2020   Procedure: RADIAL ARTERY HARVEST;  Surgeon: Wonda Olds, MD;  Location: [facility];  Service: Open Heart Surgery;  Laterality: Left;  . TEE WITHOUT CARDIOVERSION N/A 03/06/2020   Procedure: TRANSESOPHAGEAL ECHOCARDIOGRAM (TEE);  Surgeon: Wonda Olds, MD;  Location: [facility];  Service: Open Heart Surgery;  Laterality: N/A;    Family History  Family history unknown: Yes    Social History   Socioeconomic History  . Marital status: Single    Spouse name: Not on file  . Number of children: Not on file  . Years of education: 38  . Highest education level: Not on file  Occupational History  . Occupation: retired  Tobacco Use  . Smoking status: Former Smoker    Quit date: 11/23/2004    Years since quitting: 16.3  . Smokeless tobacco: Never Used  Vaping Use  . Vaping Use: Never used  Substance and Sexual Activity  . Alcohol use: No  . Drug use: No  . Sexual activity: Not on file  Other Topics Concern  . Not on file  Social History Narrative  . Not on file   Social Determinants of Health   Financial Resource Strain: Not on file  Food Insecurity: Not on file  Transportation Needs: Not on file  Physical Activity: Not on file  Stress: Not on file  Social Connections:  Not on file  Intimate Partner Violence: Not on file    Outpatient Medications Prior to Visit  Medication Sig Dispense Refill  . aspirin 81 MG chewable tablet Chew 81 mg by mouth daily.    . carvedilol (COREG) 12.5 MG tablet Take 1 tablet (12.5 mg total) by mouth 2 (two) times daily with a meal. 180 tablet 1  . Cholecalciferol (VITAMIN D) 2000 UNITS CAPS Take 2,000 Units by mouth every morning.     . clopidogrel (PLAVIX) 75 MG tablet TAKE 1 TABLET (75 MG TOTAL) BY MOUTH DAILY. 30 tablet 2  . Cyanocobalamin (VITAMIN B 12 PO) Take 1 tablet by mouth daily.      . insulin NPH Human (NOVOLIN N) 100 UNIT/ML injection Inject 80 Units into the skin daily. May do additional 80 units if evening blood sugar over 150    . Multiple Vitamin (MULTIVITAMIN WITH MINERALS) TABS Take by mouth every morning.     . rosuvastatin (CRESTOR) 40 MG tablet Take 1 tablet (40 mg total) by mouth daily. 90 tablet 3  . sacubitril-valsartan (ENTRESTO) 97-103 MG Take 1 tablet by mouth 2 (two) times daily. 180 tablet 3  . spironolactone (ALDACTONE) 25 MG tablet TAKE 1 TABLET BY MOUTH DAILY 34 tablet 2  . clopidogrel (PLAVIX) 75 MG tablet TAKE 1 TABLET BY MOUTH DAILY 34 tablet 2  . spironolactone (ALDACTONE) 25 MG tablet TAKE 1 TABLET (25 MG TOTAL) BY MOUTH DAILY. 30 tablet 2   No facility-administered medications prior to visit.    Allergies  Allergen Reactions  . Sulfa Antibiotics Itching, Swelling and Rash    ROS Review of Systems  Constitutional: Negative.  Negative for activity change and appetite change.  HENT: Positive for congestion, postnasal drip and sore throat. Negative for ear discharge, ear pain and facial swelling.   Respiratory: Positive for cough. Negative for shortness of breath, wheezing and stridor.   Cardiovascular: Negative.  Negative for chest pain.  Gastrointestinal: Negative.   Endocrine: Negative for polydipsia, polyphagia and polyuria.  Musculoskeletal: Negative.   Psychiatric/Behavioral: Negative.       Objective:    Physical Exam Constitutional:      Appearance: Normal appearance. She is obese.  Eyes:     Pupils: Pupils are equal, round, and reactive to light.  Cardiovascular:     Rate and Rhythm: Normal rate and regular rhythm.     Pulses: Normal pulses.  Pulmonary:     Effort: Pulmonary effort is normal.  Abdominal:     General: Bowel sounds are normal.  Skin:    General: Skin is warm.  Neurological:     General: No focal deficit present.     Mental Status: She is alert. Mental status is at baseline.  Psychiatric:         Mood and Affect: Mood normal.        Behavior: Behavior normal.        Thought Content: Thought content normal.        Judgment: Judgment normal.     BP (!) 142/72 (BP Location: Left Arm, Patient Position: Sitting, Cuff Size: Normal)   Pulse 94   Temp 97.9 F (36.6 C) (Temporal)   Ht 5\' 1"  (1.549 m)   Wt (!) 301 lb 12.8 oz (136.9 kg)   SpO2 99%   BMI 57.02 kg/m  Wt Readings from Last 3 Encounters:  03/18/21 (!) 301 lb 12.8 oz (136.9 kg)  08/29/20 297 lb 6.4 oz (134.9 kg)  08/01/20 289 lb 3.2  oz (131.2 kg)     Health Maintenance Due  Topic Date Due  . Hepatitis C Screening  Never done  . FOOT EXAM  Never done  . OPHTHALMOLOGY EXAM  Never done  . TETANUS/TDAP  Never done  . PAP SMEAR-Modifier  Never done  . MAMMOGRAM  Never done  . COVID-19 Vaccine (2 - Pfizer 3-dose series) 03/15/2020  . HEMOGLOBIN A1C  10/29/2020  . DEXA SCAN  Never done  . PNA vac Low Risk Adult (1 of 2 - PCV13) Never done    There are no preventive care reminders to display for this patient.  Lab Results  Component Value Date   TSH 0.870 03/05/2020   Lab Results  Component Value Date   WBC 8.0 04/02/2020   HGB 10.7 (L) 04/02/2020   HCT 33.0 (L) 04/02/2020   MCV 87 04/02/2020   PLT 370 04/02/2020   Lab Results  Component Value Date   NA 139 01/06/2021   K 3.9 01/06/2021   CO2 22 01/06/2021   GLUCOSE 141 (H) 01/06/2021   BUN 29 (H) 01/06/2021   CREATININE 1.05 (H) 01/06/2021   BILITOT 0.7 09/10/2020   ALKPHOS 84 09/10/2020   AST 19 09/10/2020   ALT 17 09/10/2020   PROT 8.2 (H) 09/10/2020   ALBUMIN 3.8 09/10/2020   CALCIUM 9.4 01/06/2021   ANIONGAP 11 01/06/2021   Lab Results  Component Value Date   CHOL 141 09/10/2020   Lab Results  Component Value Date   HDL 38 (L) 09/10/2020   Lab Results  Component Value Date   LDLCALC 79 09/10/2020   Lab Results  Component Value Date   TRIG 121 09/10/2020   Lab Results  Component Value Date   CHOLHDL 3.7 09/10/2020   Lab  Results  Component Value Date   HGBA1C 8.4 (A) 04/29/2020   HGBA1C 8.4 04/29/2020   HGBA1C 8.4 (A) 04/29/2020   HGBA1C 8.4 (A) 04/29/2020      Assessment & Plan:   Problem List Items Addressed This Visit   None   Visit Diagnoses    Symptoms of upper respiratory infection (URI)    -  Primary   Relevant Medications   benzonatate (TESSALON PERLES) 100 MG capsule    1. Symptoms of upper respiratory infection (URI) Patient advised to increase rest, handwashing, and fluid intake.  Follow-up in clinic as needed. - azithromycin (ZITHROMAX) 250 MG tablet; Take 2 tablets on day 1, then 1 tablet daily on days 2 through 5  Dispense: 6 tablet; Refill: 0 - benzonatate (TESSALON PERLES) 100 MG capsule; Take 1 capsule (100 mg total) by mouth 3 (three) times daily as needed for cough.  Dispense: 30 capsule; Refill: 0  2. Cough in adult - benzonatate (TESSALON PERLES) 100 MG capsule; Take 1 capsule (100 mg total) by mouth 3 (three) times daily as needed for cough.  Dispense: 30 capsule; Refill: 0  3. Chest congestion - azithromycin (ZITHROMAX) 250 MG tablet; Take 2 tablets on day 1, then 1 tablet daily on days 2 through 5  Dispense: 6 tablet; Refill: 0 - benzonatate (TESSALON PERLES) 100 MG capsule; Take 1 capsule (100 mg total) by mouth 3 (three) times daily as needed for cough.  Dispense: 30 capsule; Refill: 0  Follow-up: No follow-ups on file.    Cammie Sickle, FNP

## 2021-03-18 NOTE — Patient Instructions (Signed)
COVID-19 Vaccine Information can be found at: [email or web address].[email or web address] For questions related to vaccine distribution or appointments, please email [email or web address] or call [phone].  Azithromycin, day 1 500 mg, and on days 2-5, take 250 mg.  Tessalon perles 100 mg three times per day as needed Over the counter Zinc to improve immune function   Upper Respiratory Infection, Adult An upper respiratory infection (URI) affects the nose, throat, and upper air passages. URIs are caused by germs (viruses). The most common type of URI is often called "the common cold." Medicines cannot cure URIs, but you can do things at home to relieve your symptoms. URIs usually get better within 7-10 days. Follow these instructions at home: Activity  Rest as needed.  If you have a fever, stay home from work or school until your fever is gone, or until your doctor says you may return to work or school. ? You should stay home until you cannot spread the infection anymore (you are not contagious). ? Your doctor may have you wear a face mask so you have less risk of spreading the infection. Relieving symptoms  Gargle with a salt-water mixture 3-4 times a day or as needed. To make a salt-water mixture, completely dissolve -1 tsp of salt in 1 cup of warm water.  Use a cool-mist humidifier to add moisture to the air. This can help you breathe more easily. Eating and drinking  Drink enough fluid to keep your pee (urine) pale yellow.  Eat soups and other clear broths.   General instructions  Take over-the-counter and prescription medicines only as told by your doctor. These include cold medicines, fever reducers, and cough suppressants.  Do not use any products that contain nicotine or tobacco. These include cigarettes and e-cigarettes. If you need help quitting, ask your doctor.  Avoid being where people are smoking (avoid secondhand smoke).  Make  sure you get regular shots and get the flu shot every year.  Keep all follow-up visits as told by your doctor. This is important.   How to avoid spreading infection to others  Wash your hands often with soap and water. If you do not have soap and water, use hand sanitizer.  Avoid touching your mouth, face, eyes, or nose.  Cough or sneeze into a tissue or your sleeve or elbow. Do not cough or sneeze into your hand or into the air.   Contact a doctor if:  You are getting worse, not better.  You have any of these: ? A fever. ? Chills. ? Brown or red mucus in your nose. ? Yellow or brown fluid (discharge)coming from your nose. ? Pain in your face, especially when you bend forward. ? Swollen neck glands. ? Pain with swallowing. ? White areas in the back of your throat. Get help right away if:  You have shortness of breath that gets worse.  You have very bad or constant: ? Headache. ? Ear pain. ? Pain in your forehead, behind your eyes, and over your cheekbones (sinus pain). ? Chest pain.  You have long-lasting (chronic) lung disease along with any of these: ? Wheezing. ? Long-lasting cough. ? Coughing up blood. ? A change in your usual mucus.  You have a stiff neck.  You have changes in your: ? Vision. ? Hearing. ? Thinking. ? Mood. Summary  An upper respiratory infection (URI) is caused by a germ called a virus. The most common type of URI is often called "the common cold."  URIs usually get better within 7-10  days.  Take over-the-counter and prescription medicines only as told by your doctor. This information is not intended to replace advice given to you by your health care provider. Make sure you discuss any questions you have with your health care provider. Document Revised: 07/18/2020 Document Reviewed: 07/18/2020 Elsevier Patient Education  [place].

## 2021-03-20 MED FILL — Clopidogrel Bisulfate Tab 75 MG (Base Equiv): ORAL | 30 days supply | Qty: 30 | Fill #0 | Status: CN

## 2021-03-21 ENCOUNTER — Other Ambulatory Visit: Payer: Self-pay | Admitting: *Deleted

## 2021-03-21 ENCOUNTER — Other Ambulatory Visit (HOSPITAL_COMMUNITY): Payer: Self-pay

## 2021-03-21 ENCOUNTER — Ambulatory Visit (HOSPITAL_COMMUNITY)
Admission: RE | Admit: 2021-03-21 | Discharge: 2021-03-21 | Disposition: A | Discharge status: Home / Self Care | Payer: Medicare HMO | Source: Ambulatory Visit | Attending: Cardiology | Admitting: Cardiology

## 2021-03-21 ENCOUNTER — Telehealth: Payer: Self-pay | Admitting: Cardiovascular Disease

## 2021-03-21 ENCOUNTER — Encounter (HOSPITAL_COMMUNITY): Payer: Self-pay | Admitting: Cardiology

## 2021-03-21 ENCOUNTER — Other Ambulatory Visit: Payer: Self-pay

## 2021-03-21 VITALS — BP 124/70 | HR 76 | Wt 285.4 lb

## 2021-03-21 DIAGNOSIS — E669 Obesity, unspecified: Secondary | ICD-10-CM | POA: Insufficient documentation

## 2021-03-21 DIAGNOSIS — K5790 Diverticulosis of intestine, part unspecified, without perforation or abscess without bleeding: Secondary | ICD-10-CM | POA: Diagnosis not present

## 2021-03-21 DIAGNOSIS — Z6841 Body Mass Index (BMI) 40.0 and over, adult: Secondary | ICD-10-CM | POA: Insufficient documentation

## 2021-03-21 DIAGNOSIS — I252 Old myocardial infarction: Secondary | ICD-10-CM | POA: Diagnosis not present

## 2021-03-21 DIAGNOSIS — Z79899 Other long term (current) drug therapy: Secondary | ICD-10-CM | POA: Insufficient documentation

## 2021-03-21 DIAGNOSIS — Z951 Presence of aortocoronary bypass graft: Secondary | ICD-10-CM | POA: Insufficient documentation

## 2021-03-21 DIAGNOSIS — Z87891 Personal history of nicotine dependence: Secondary | ICD-10-CM | POA: Diagnosis not present

## 2021-03-21 DIAGNOSIS — I110 Hypertensive heart disease with heart failure: Secondary | ICD-10-CM | POA: Insufficient documentation

## 2021-03-21 DIAGNOSIS — E785 Hyperlipidemia, unspecified: Secondary | ICD-10-CM | POA: Insufficient documentation

## 2021-03-21 DIAGNOSIS — Z7902 Long term (current) use of antithrombotics/antiplatelets: Secondary | ICD-10-CM | POA: Diagnosis not present

## 2021-03-21 DIAGNOSIS — I5042 Chronic combined systolic (congestive) and diastolic (congestive) heart failure: Secondary | ICD-10-CM | POA: Diagnosis not present

## 2021-03-21 DIAGNOSIS — K219 Gastro-esophageal reflux disease without esophagitis: Secondary | ICD-10-CM | POA: Diagnosis not present

## 2021-03-21 DIAGNOSIS — E119 Type 2 diabetes mellitus without complications: Secondary | ICD-10-CM | POA: Insufficient documentation

## 2021-03-21 DIAGNOSIS — I2510 Atherosclerotic heart disease of native coronary artery without angina pectoris: Secondary | ICD-10-CM | POA: Diagnosis not present

## 2021-03-21 DIAGNOSIS — Z7982 Long term (current) use of aspirin: Secondary | ICD-10-CM | POA: Diagnosis not present

## 2021-03-21 DIAGNOSIS — I25119 Atherosclerotic heart disease of native coronary artery with unspecified angina pectoris: Secondary | ICD-10-CM

## 2021-03-21 DIAGNOSIS — M1990 Unspecified osteoarthritis, unspecified site: Secondary | ICD-10-CM | POA: Diagnosis not present

## 2021-03-21 HISTORY — DX: Heart failure, unspecified: I50.9

## 2021-03-21 LAB — BASIC METABOLIC PANEL
Anion gap: 6 (ref 5–15)
BUN: 26 mg/dL — ABNORMAL HIGH (ref 8–23)
CO2: 25 mmol/L (ref 22–32)
Calcium: 9 mg/dL (ref 8.9–10.3)
Chloride: 104 mmol/L (ref 98–111)
Creatinine, Ser: 1.3 mg/dL — ABNORMAL HIGH (ref 0.44–1.00)
GFR, Estimated: 46 mL/min — ABNORMAL LOW (ref >60)
Glucose, Bld: 212 mg/dL — ABNORMAL HIGH (ref 70–99)
Potassium: 4.5 mmol/L (ref 3.5–5.1)
Sodium: 135 mmol/L (ref 135–145)

## 2021-03-21 MED ORDER — EMPAGLIFLOZIN 10 MG PO TABS
10.0000 mg | ORAL_TABLET | Freq: Every day | ORAL | 6 refills | Status: DC
Start: 2021-03-21 — End: 2021-03-21

## 2021-03-21 MED ORDER — CARVEDILOL 12.5 MG PO TABS
12.5000 mg | ORAL_TABLET | Freq: Two times a day (BID) | ORAL | 1 refills | Status: DC
  Filled 2021-03-21: qty 180, 90d supply, fill #0
  Filled 2021-06-30: qty 180, 90d supply, fill #1

## 2021-03-21 MED ORDER — EMPAGLIFLOZIN 10 MG PO TABS
10.0000 mg | ORAL_TABLET | Freq: Every day | ORAL | 4 refills | Status: DC
  Filled 2021-03-21: qty 30, 30d supply, fill #0
  Filled 2021-04-22: qty 30, 30d supply, fill #1

## 2021-03-21 MED ORDER — SPIRONOLACTONE 25 MG PO TABS
25.0000 mg | ORAL_TABLET | Freq: Every day | ORAL | 1 refills | Status: DC
Start: 2021-02-17 — End: 2021-05-27
  Filled 2021-03-21: qty 30, 30d supply, fill #0
  Filled 2021-04-21: qty 30, 30d supply, fill #1

## 2021-03-21 MED ORDER — CARVEDILOL 6.25 MG PO TABS
6.2500 mg | ORAL_TABLET | Freq: Two times a day (BID) | ORAL | 0 refills | Status: DC
Start: 2020-06-27 — End: 2021-03-21
  Filled 2021-03-21: qty 60, 30d supply, fill #0

## 2021-03-21 NOTE — Telephone Encounter (Signed)
Pt c/o medication issue:  1. Name of Medication: carvedilol (COREG) 12.5 MG tablet  2. How are you currently taking this medication (dosage and times per day)? 1 tablet twice a day  3. Are you having a reaction (difficulty breathing--STAT)? no  4. What is your medication issue? Erin from [facility] states the medication states it is discontinued in their system, but the patient states she was not told to stop taking it. Patient is at the pharmacy now.

## 2021-03-21 NOTE — Patient Instructions (Signed)
Stop Plavix  Start Jardiance 10 mg Daily  Labs done today, your results will be available in MyChart, we will contact you for abnormal readings.  Your physician recommends that you return for lab work in: 10-14 days  Please call our office in September to schedule your follow up appointment and echocardiogram  If you have any questions or concerns before your next appointment please send [place] a message through [place] or call our office at [phone].    TO LEAVE A MESSAGE FOR THE NURSE SELECT OPTION 2, PLEASE LEAVE A MESSAGE INCLUDING: . YOUR NAME . DATE OF BIRTH . CALL BACK NUMBER . REASON FOR CALL**this is important as we prioritize the call backs  [place] AS LONG AS YOU CALL BEFORE 4:00 PM  At the [facility], you and your health needs are our priority. As part of our continuing mission to provide you with exceptional heart care, we have created designated Provider Care Teams. These Care Teams include your primary Cardiologist (physician) and Advanced Practice Providers (APPs- Physician Assistants and Nurse Practitioners) who all work together to provide you with the care you need, when you need it.   You may see any of the following providers on your designated Care Team at your next follow up: Marland Kitchen Dr Glori Bickers . Dr Loralie Champagne . Dr Vickki Muff . Darrick Grinder, NP . Lyda Jester, [place] . Audry Riles, PharmD   Please be sure to bring in all your medications bottles to every appointment.

## 2021-03-21 NOTE — Telephone Encounter (Signed)
rx sent to pharmacy

## 2021-03-23 NOTE — Progress Notes (Signed)
ID:  Rhonda Roberts, DOB June 01, 1956, MRN [id number]   Provider location: [place] [facility] Type of Visit: Established patient   PCP:  Vevelyn Francois, NP  HF Cardiology: Dr. Aundra Dubin   History of Present Illness: Rhonda Roberts is a 65 y.o. female who has a history of DMII, HTN, dyslipidemia, obesity, GERD, diverticulosis, and arthritis.   Presented to [facility] ED on 03/01/2020 with N/V and intermittent chest pain. EKG with ST depression inferior and anterolatera leads. HS Trop [phone] (NSTEMI). LHC with 3 vessel disease, low EF, and elevated LVEDP.  Echo with EF 20-25%. Post cath she was acutely short of breath. Placed on bipap, nitro drip at 10 mcg, and diuresed with IV lasix. CT surgery consulted. Once tuned up she underwent CABGx4. Gradually drips stopped. HF meds started. Discharged to home with her daughter.   Echo in 7/21 showed EF up to 45-50%.   She returns for followup of CHF and CAD.  Has had URI recently, cough with yellow sputum.  She was started on azithromycin and has been improving over the last couple days.  Weight stable.  No chest pain.  Dyspnea only with heavy exertion.  No orthopnea/PND.   ECG (personally reviewed): NSR, normal  Labs (5/21): K 4.5, creatinine 1.54 Labs (7/21): LDL 77, HDL 37, K 4.8, creatinine 1.3 Labs (10/21): LDL 79, HDL 38 Labs (2/22): K 3.9, creatinine 1.05  PMH: 1. Type 2 diabetes 2. HTN 3. Hyperlipidemia: Myalgias with atorvastatin.  4. Obesity 5. GERD 6. Diverticulosis 7. CAD: NSTEMI in 4/21.  - LHC with 3VD, CABG with LIMA-LAD, pedicled RIMA-PDA, sequential left radial to OM and D.  8. Chronic systolic CHF: Ischemic cardiomyopathy.  - Echo (4/21): EF 20-25%.   - Echo (7/21): EF 45-50%, basal-mid inferior hypokinesis, normal RV.   ROS: All systems negative except as listed in HPI, PMH and Problem List.  Social History   Socioeconomic History  . Marital status: Single    Spouse name: Not on file  . Number  of children: Not on file  . Years of education: 43  . Highest education level: Not on file  Occupational History  . Occupation: retired  Tobacco Use  . Smoking status: Former Smoker    Quit date: 11/23/2004    Years since quitting: 16.3  . Smokeless tobacco: Never Used  Vaping Use  . Vaping Use: Never used  Substance and Sexual Activity  . Alcohol use: No  . Drug use: No  . Sexual activity: Not on file  Other Topics Concern  . Not on file  Social History Narrative  . Not on file   Social Determinants of Health   Financial Resource Strain: Not on file  Food Insecurity: Not on file  Transportation Needs: Not on file  Physical Activity: Not on file  Stress: Not on file  Social Connections: Not on file  Intimate Partner Violence: Not on file    FH: No premature CAD.    Current Outpatient Medications  Medication Sig Dispense Refill  . aspirin 81 MG chewable tablet Chew 81 mg by mouth daily.    Marland Kitchen azithromycin (ZITHROMAX) 250 MG tablet Take 2 tablets on day 1, then 1 tablet daily on days 2 through 5 6 tablet 0  . benzonatate (TESSALON PERLES) 100 MG capsule Take 1 capsule (100 mg total) by mouth 3 (three) times daily as needed for cough. 30 capsule 0  . Cholecalciferol (VITAMIN D) 2000 UNITS CAPS Take 2,000 Units by mouth  every morning.     . Cyanocobalamin (VITAMIN B 12 PO) Take 1 tablet by mouth daily.     . empagliflozin (JARDIANCE) 10 MG TABS tablet Take 1 tablet (10 mg total) by mouth daily. 30 tablet 4  . insulin NPH Human (NOVOLIN N) 100 UNIT/ML injection Inject 80 Units into the skin daily. May do additional 80 units if evening blood sugar over 150    . Multiple Vitamin (MULTIVITAMIN WITH MINERALS) TABS Take by mouth every morning.     . rosuvastatin (CRESTOR) 40 MG tablet Take 1 tablet (40 mg total) by mouth daily. 90 tablet 3  . sacubitril-valsartan (ENTRESTO) 97-103 MG Take 1 tablet by mouth 2 (two) times daily. 180 tablet 3  . spironolactone (ALDACTONE) 25 MG tablet  Take 1 tablet (25 mg total) by mouth daily. 30 tablet 1  . Zinc 50 MG CAPS Take 1 capsule by mouth daily in the afternoon.    . carvedilol (COREG) 12.5 MG tablet Take 1 tablet (12.5 mg total) by mouth 2 (two) times daily with a meal. 180 tablet 1   No current facility-administered medications for this encounter.    Vitals:   03/21/21 1531  BP: 124/70  Pulse: 76  SpO2: 96%  Weight: 129.5 kg (285 lb 6.4 oz)   Wt Readings from Last 3 Encounters:  03/21/21 129.5 kg (285 lb 6.4 oz)  03/18/21 (!) 136.9 kg (301 lb 12.8 oz)  08/29/20 134.9 kg (297 lb 6.4 oz)   Exam:   BP 124/70   Pulse 76   Wt 129.5 kg (285 lb 6.4 oz)   SpO2 96%   BMI 53.93 kg/m  General: NAD Neck: No JVD, no thyromegaly or thyroid nodule.  Lungs: Rare rhonchi CV: Nondisplaced PMI.  Heart regular S1/S2, no S3/S4, no murmur.  No peripheral edema.  No carotid bruit.  Normal pedal pulses.  Abdomen: Soft, nontender, no hepatosplenomegaly, no distention.  Skin: Intact without lesions or rashes.  Neurologic: Alert and oriented x 3.  Psych: Normal affect. Extremities: No clubbing or cyanosis.  HEENT: Normal.   ASSESSMENT & PLAN: 1. CAD:  NSTEMI in 4/21, LHC with 3VD, now s/p CABG x4 with LIMA-LAD, seq radial-D/OM, RIMA-PDA.  No chest pain. - Continue ASA 81 mg daily.  - She has been on Plavix now 1 year post-NSTEMI, she can stop it.   - Continue Crestor, lipids ok in 10/21.  2. Chronic Systolic CHF: Ischemic cardiomyopathy.  Echo pre-op with 4/21 EF 20-25%.  Echo in 7/21 showed EF up to 45-50%. NYHA class II symptoms, not volume overloaded on exam.  - Continue Entresto 97/103 bid.  BMET today.     - Continue spironolactone 25 qd. - Continue Coreg to 12.5 mg bid.   - Start Jardiance 10 mg daily, BMET 10 days.  - EF is out of ICD range.  - Repeat echo at followup in 6 months.  3. HLD: Myalgias with atorvastatin, now on Crestor 40 mg daily with no problems.  LDL close to goal (<70) in 10/21.    followup in 6 months  with echo.   Signed, Loralie Champagne, MD  03/23/2021  [place] [address] [facility] [place] [address] [phone] (office) [phone] (fax)

## 2021-03-24 ENCOUNTER — Other Ambulatory Visit (HOSPITAL_COMMUNITY): Payer: Self-pay

## 2021-03-27 ENCOUNTER — Other Ambulatory Visit: Payer: Self-pay | Admitting: Family Medicine

## 2021-03-27 DIAGNOSIS — R0989 Other specified symptoms and signs involving the circulatory and respiratory systems: Secondary | ICD-10-CM

## 2021-03-27 NOTE — Telephone Encounter (Signed)
Pt ask to get a refill on [place] [address]

## 2021-03-31 ENCOUNTER — Telehealth (HOSPITAL_COMMUNITY): Payer: Self-pay | Admitting: *Deleted

## 2021-03-31 ENCOUNTER — Ambulatory Visit (HOSPITAL_COMMUNITY)
Admission: RE | Admit: 2021-03-31 | Discharge: 2021-03-31 | Disposition: A | Discharge status: Home / Self Care | Payer: Medicare HMO | Source: Ambulatory Visit | Attending: Cardiology | Admitting: Cardiology

## 2021-03-31 ENCOUNTER — Other Ambulatory Visit: Payer: Self-pay

## 2021-03-31 ENCOUNTER — Other Ambulatory Visit (HOSPITAL_COMMUNITY): Payer: Self-pay

## 2021-03-31 DIAGNOSIS — I5042 Chronic combined systolic (congestive) and diastolic (congestive) heart failure: Secondary | ICD-10-CM | POA: Diagnosis not present

## 2021-03-31 LAB — BASIC METABOLIC PANEL
Anion gap: 5 (ref 5–15)
BUN: 26 mg/dL — ABNORMAL HIGH (ref 8–23)
CO2: 26 mmol/L (ref 22–32)
Calcium: 9.4 mg/dL (ref 8.9–10.3)
Chloride: 107 mmol/L (ref 98–111)
Creatinine, Ser: 1.36 mg/dL — ABNORMAL HIGH (ref 0.44–1.00)
GFR, Estimated: 43 mL/min — ABNORMAL LOW (ref >60)
Glucose, Bld: 107 mg/dL — ABNORMAL HIGH (ref 70–99)
Potassium: 5.1 mmol/L (ref 3.5–5.1)
Sodium: 138 mmol/L (ref 135–145)

## 2021-03-31 MED ORDER — FLUCONAZOLE 150 MG PO TABS
150.0000 mg | ORAL_TABLET | Freq: Every day | ORAL | 0 refills | Status: AC
  Filled 2021-03-31: qty 1, 1d supply, fill #0

## 2021-03-31 NOTE — Telephone Encounter (Signed)
Pt came in for lab visit and c/o yeast infection since starting jardiance. Pt asked that we send in medication to treat yeast infection.   Routed to [facility]

## 2021-03-31 NOTE — Telephone Encounter (Signed)
Pt aware and agreeable with plan.  

## 2021-03-31 NOTE — Addendum Note (Signed)
Addended by: Harvie Junior on: 03/31/2021 03:48 PM   Modules accepted: Orders

## 2021-04-01 ENCOUNTER — Other Ambulatory Visit: Payer: Self-pay | Admitting: Family Medicine

## 2021-04-01 DIAGNOSIS — R0989 Other specified symptoms and signs involving the circulatory and respiratory systems: Secondary | ICD-10-CM

## 2021-04-01 DIAGNOSIS — R059 Cough, unspecified: Secondary | ICD-10-CM

## 2021-04-16 ENCOUNTER — Other Ambulatory Visit (HOSPITAL_COMMUNITY): Payer: Self-pay | Admitting: *Deleted

## 2021-04-16 MED ORDER — FLUCONAZOLE 150 MG PO TABS: 150.0000 mg | ORAL_TABLET | Freq: Once | ORAL | 0 refills | Status: AC

## 2021-04-22 ENCOUNTER — Other Ambulatory Visit (HOSPITAL_COMMUNITY): Payer: Self-pay

## 2021-04-23 ENCOUNTER — Other Ambulatory Visit (HOSPITAL_COMMUNITY): Payer: Self-pay

## 2021-04-24 ENCOUNTER — Other Ambulatory Visit (HOSPITAL_COMMUNITY): Payer: Self-pay

## 2021-05-26 ENCOUNTER — Other Ambulatory Visit (HOSPITAL_COMMUNITY): Payer: Self-pay

## 2021-05-26 ENCOUNTER — Other Ambulatory Visit (HOSPITAL_COMMUNITY): Payer: Self-pay | Admitting: Cardiology

## 2021-05-27 ENCOUNTER — Other Ambulatory Visit (HOSPITAL_COMMUNITY): Payer: Self-pay | Admitting: *Deleted

## 2021-05-27 ENCOUNTER — Other Ambulatory Visit (HOSPITAL_COMMUNITY): Payer: Self-pay

## 2021-05-27 MED FILL — Spironolactone Tab 25 MG: ORAL | 30 days supply | Qty: 30 | Fill #0 | Status: AC

## 2021-05-28 ENCOUNTER — Other Ambulatory Visit (HOSPITAL_COMMUNITY): Payer: Self-pay | Admitting: *Deleted

## 2021-05-28 ENCOUNTER — Other Ambulatory Visit (HOSPITAL_COMMUNITY): Payer: Self-pay

## 2021-05-28 MED ORDER — ENTRESTO 97-103 MG PO TABS
1.0000 | ORAL_TABLET | Freq: Two times a day (BID) | ORAL | 0 refills | Status: DC
Start: 2021-05-28 — End: 2021-07-17
  Filled 2021-05-28: qty 60, 30d supply, fill #0

## 2021-05-29 ENCOUNTER — Other Ambulatory Visit (HOSPITAL_COMMUNITY): Payer: Self-pay | Admitting: Unknown Physician Specialty

## 2021-05-29 MED ORDER — EMPAGLIFLOZIN 10 MG PO TABS: 10.0000 mg | ORAL_TABLET | Freq: Every day | ORAL | 3 refills | Status: DC

## 2021-05-29 NOTE — Telephone Encounter (Signed)
Advanced Heart Failure Patient Advocate Encounter  Received completed patient portion of [employer] patient assistance application for [employer]. Placed at provider desk for signature.  Will fax once signed.

## 2021-05-30 ENCOUNTER — Encounter (HOSPITAL_COMMUNITY): Payer: Self-pay | Admitting: *Deleted

## 2021-06-06 ENCOUNTER — Other Ambulatory Visit (HOSPITAL_COMMUNITY): Payer: Self-pay

## 2021-06-06 ENCOUNTER — Telehealth (HOSPITAL_COMMUNITY): Payer: Self-pay | Admitting: Pharmacy Technician

## 2021-06-06 NOTE — Telephone Encounter (Signed)
Advanced Heart Failure Patient Advocate Encounter  I received a partially filled out [employer] application from this patient. The income section is blank. I attempted to call both numbers listed in Epic. The home number is not in service and there is no vm set up on the cell phone.  Currently the PAN HF grant is open, would like to sign the patient up for the grant instead of assistance if possible. Current 30 day co-pay is $47.  Cannot move forward with either option until income information is obtained.

## 2021-06-11 ENCOUNTER — Ambulatory Visit: Payer: Medicare HMO | Admitting: Nurse Practitioner

## 2021-06-30 ENCOUNTER — Other Ambulatory Visit (HOSPITAL_COMMUNITY): Payer: Self-pay

## 2021-06-30 MED FILL — Spironolactone Tab 25 MG: ORAL | 30 days supply | Qty: 30 | Fill #1 | Status: AC

## 2021-07-01 ENCOUNTER — Other Ambulatory Visit (HOSPITAL_COMMUNITY): Payer: Self-pay

## 2021-07-02 ENCOUNTER — Other Ambulatory Visit (HOSPITAL_COMMUNITY): Payer: Self-pay

## 2021-07-17 ENCOUNTER — Other Ambulatory Visit (HOSPITAL_COMMUNITY): Payer: Self-pay | Admitting: Cardiology

## 2021-07-17 ENCOUNTER — Telehealth (HOSPITAL_COMMUNITY): Payer: Self-pay | Admitting: Pharmacy Technician

## 2021-07-17 ENCOUNTER — Other Ambulatory Visit (HOSPITAL_COMMUNITY): Payer: Self-pay

## 2021-07-17 MED ORDER — ENTRESTO 97-103 MG PO TABS
1.0000 | ORAL_TABLET | Freq: Two times a day (BID) | ORAL | 3 refills | Status: AC
  Filled 2021-07-17 – 2021-10-24 (×2): qty 180, 90d supply, fill #0
  Filled 2022-04-20: qty 180, 90d supply, fill #1
  Filled 2022-04-23: qty 60, 30d supply, fill #1

## 2021-07-17 NOTE — Telephone Encounter (Signed)
Advanced Heart Failure Patient Advocate Encounter  I attempted to call the patient again regarding Entresto assistance application or the initiation of a grant that will help cover the cost of the medication. Her voicemail is full. Called patient's daughter Judeen Hammans, left message.  Will be here to assist in the future as needed.  Charlann Boxer, CPhT

## 2021-07-17 NOTE — Telephone Encounter (Signed)
Advanced Heart Failure Patient Advocate Encounter  Patient called back and provided income information. Was able to obtain a PAN HF grant to cover the cost of Entresto. I informed the patient that she could also use this grant to cover Jardiance, it would just be out of funds quicker. I believe she will use the grant for both.  Member ID: [id number] Group ID: [id number] RxBin ID: [id number] PCN: [id number] Eligibility Start Date: 04/18/2021 Eligibility End Date: 07/16/2022 Assistance Amount: $1,000.00   Sent 90 day RX request to Chantel (CMA) to send to [facility]. Pharmacy has grant information.  Charlann Boxer, CPhT

## 2021-07-30 DIAGNOSIS — R059 Cough, unspecified: Secondary | ICD-10-CM | POA: Diagnosis not present

## 2021-07-30 DIAGNOSIS — U071 COVID-19: Secondary | ICD-10-CM | POA: Diagnosis not present

## 2021-08-01 DIAGNOSIS — Z9189 Other specified personal risk factors, not elsewhere classified: Secondary | ICD-10-CM | POA: Diagnosis not present

## 2021-08-01 DIAGNOSIS — U071 COVID-19: Secondary | ICD-10-CM | POA: Diagnosis not present

## 2021-08-05 DIAGNOSIS — Z8616 Personal history of COVID-19: Secondary | ICD-10-CM | POA: Diagnosis not present

## 2021-08-05 DIAGNOSIS — R0602 Shortness of breath: Secondary | ICD-10-CM | POA: Diagnosis not present

## 2021-08-05 DIAGNOSIS — R0981 Nasal congestion: Secondary | ICD-10-CM | POA: Diagnosis not present

## 2021-08-05 DIAGNOSIS — Z1159 Encounter for screening for other viral diseases: Secondary | ICD-10-CM | POA: Diagnosis not present

## 2021-08-05 DIAGNOSIS — I252 Old myocardial infarction: Secondary | ICD-10-CM | POA: Diagnosis not present

## 2021-08-05 DIAGNOSIS — R9431 Abnormal electrocardiogram [ECG] [EKG]: Secondary | ICD-10-CM | POA: Diagnosis not present

## 2021-08-07 ENCOUNTER — Other Ambulatory Visit (HOSPITAL_BASED_OUTPATIENT_CLINIC_OR_DEPARTMENT_OTHER): Payer: Self-pay

## 2021-08-09 ENCOUNTER — Telehealth: Payer: Self-pay

## 2021-08-09 NOTE — Telephone Encounter (Signed)
Caled pt to schedule AWV. Could not leave msg.

## 2021-08-12 ENCOUNTER — Other Ambulatory Visit (HOSPITAL_COMMUNITY): Payer: Self-pay

## 2021-08-19 ENCOUNTER — Other Ambulatory Visit (HOSPITAL_COMMUNITY): Payer: Self-pay

## 2021-09-01 ENCOUNTER — Other Ambulatory Visit (HOSPITAL_COMMUNITY): Payer: Self-pay

## 2021-09-01 MED FILL — Spironolactone Tab 25 MG: ORAL | 30 days supply | Qty: 30 | Fill #2 | Status: CN

## 2021-09-02 ENCOUNTER — Other Ambulatory Visit (HOSPITAL_COMMUNITY): Payer: Self-pay

## 2021-09-02 ENCOUNTER — Other Ambulatory Visit (HOSPITAL_COMMUNITY): Payer: Self-pay | Admitting: Cardiology

## 2021-09-02 MED ORDER — EMPAGLIFLOZIN 10 MG PO TABS
10.0000 mg | ORAL_TABLET | Freq: Every day | ORAL | 4 refills | Status: AC
Start: 2021-09-02 — End: ?
  Filled 2021-09-02 – 2021-10-23 (×2): qty 30, 30d supply, fill #0

## 2021-09-03 ENCOUNTER — Other Ambulatory Visit (HOSPITAL_COMMUNITY): Payer: Self-pay

## 2021-09-08 ENCOUNTER — Other Ambulatory Visit (HOSPITAL_COMMUNITY): Payer: Self-pay

## 2021-09-09 ENCOUNTER — Other Ambulatory Visit (HOSPITAL_COMMUNITY): Payer: Self-pay

## 2021-10-01 ENCOUNTER — Ambulatory Visit: Payer: Medicare HMO | Admitting: Nurse Practitioner

## 2021-10-18 ENCOUNTER — Other Ambulatory Visit: Payer: Self-pay | Admitting: Cardiovascular Disease

## 2021-10-20 ENCOUNTER — Other Ambulatory Visit (HOSPITAL_COMMUNITY): Payer: Self-pay

## 2021-10-20 MED ORDER — CARVEDILOL 12.5 MG PO TABS
12.5000 mg | ORAL_TABLET | Freq: Two times a day (BID) | ORAL | 1 refills | Status: AC
  Filled 2021-10-20 – 2021-10-29 (×2): qty 180, 90d supply, fill #0

## 2021-10-23 ENCOUNTER — Other Ambulatory Visit (HOSPITAL_COMMUNITY): Payer: Self-pay

## 2021-10-23 MED FILL — Spironolactone Tab 25 MG: ORAL | 30 days supply | Qty: 30 | Fill #2 | Status: AC

## 2021-10-24 ENCOUNTER — Other Ambulatory Visit (HOSPITAL_COMMUNITY): Payer: Self-pay

## 2021-10-24 ENCOUNTER — Telehealth (HOSPITAL_COMMUNITY): Payer: Self-pay | Admitting: Pharmacy Technician

## 2021-10-24 NOTE — Telephone Encounter (Signed)
Advanced Heart Failure Patient Advocate Encounter  Patient called and left a message stating that she called [employer] to try and get a refill but they told her she did not have assistance at this time. The patient has a PAN HF grant that will cover the Entresto and Jardiance that have yet to be used. The information is in the [facility] outpatient system where the patient picks up her medications from. I was able to go ahead and place a refill with the attached grant information. Her 90 day copay is now $0.  Called and spoke with the patient.    Charlann Boxer, CPhT

## 2021-10-27 ENCOUNTER — Other Ambulatory Visit (HOSPITAL_COMMUNITY): Payer: Self-pay

## 2021-10-28 ENCOUNTER — Other Ambulatory Visit (HOSPITAL_COMMUNITY): Payer: Self-pay

## 2021-10-29 ENCOUNTER — Other Ambulatory Visit (HOSPITAL_COMMUNITY): Payer: Self-pay

## 2021-10-30 ENCOUNTER — Other Ambulatory Visit (HOSPITAL_COMMUNITY): Payer: Self-pay

## 2021-11-06 ENCOUNTER — Other Ambulatory Visit (HOSPITAL_COMMUNITY): Payer: Self-pay

## 2021-11-25 ENCOUNTER — Other Ambulatory Visit: Payer: Self-pay | Admitting: Cardiology

## 2021-12-02 ENCOUNTER — Encounter (HOSPITAL_COMMUNITY): Payer: Self-pay | Admitting: *Deleted

## 2021-12-05 ENCOUNTER — Other Ambulatory Visit (HOSPITAL_COMMUNITY): Payer: Self-pay

## 2021-12-08 ENCOUNTER — Other Ambulatory Visit (HOSPITAL_COMMUNITY): Payer: Self-pay

## 2021-12-10 ENCOUNTER — Other Ambulatory Visit (HOSPITAL_COMMUNITY): Payer: Self-pay

## 2021-12-11 ENCOUNTER — Other Ambulatory Visit (HOSPITAL_COMMUNITY): Payer: Self-pay

## 2021-12-19 ENCOUNTER — Telehealth: Payer: Self-pay

## 2021-12-19 NOTE — Telephone Encounter (Signed)
Attempted to contact patient, unable to reach to schedule Medicare annual wellness visit.

## 2022-01-20 IMAGING — CR DG CHEST 2V
2 series · 2 of 2 positions shown · non-contrast
Comparison: 12/18/2016

CLINICAL DATA: Chest pain

EXAM:
CHEST - 2 VIEW

[w chest pa]
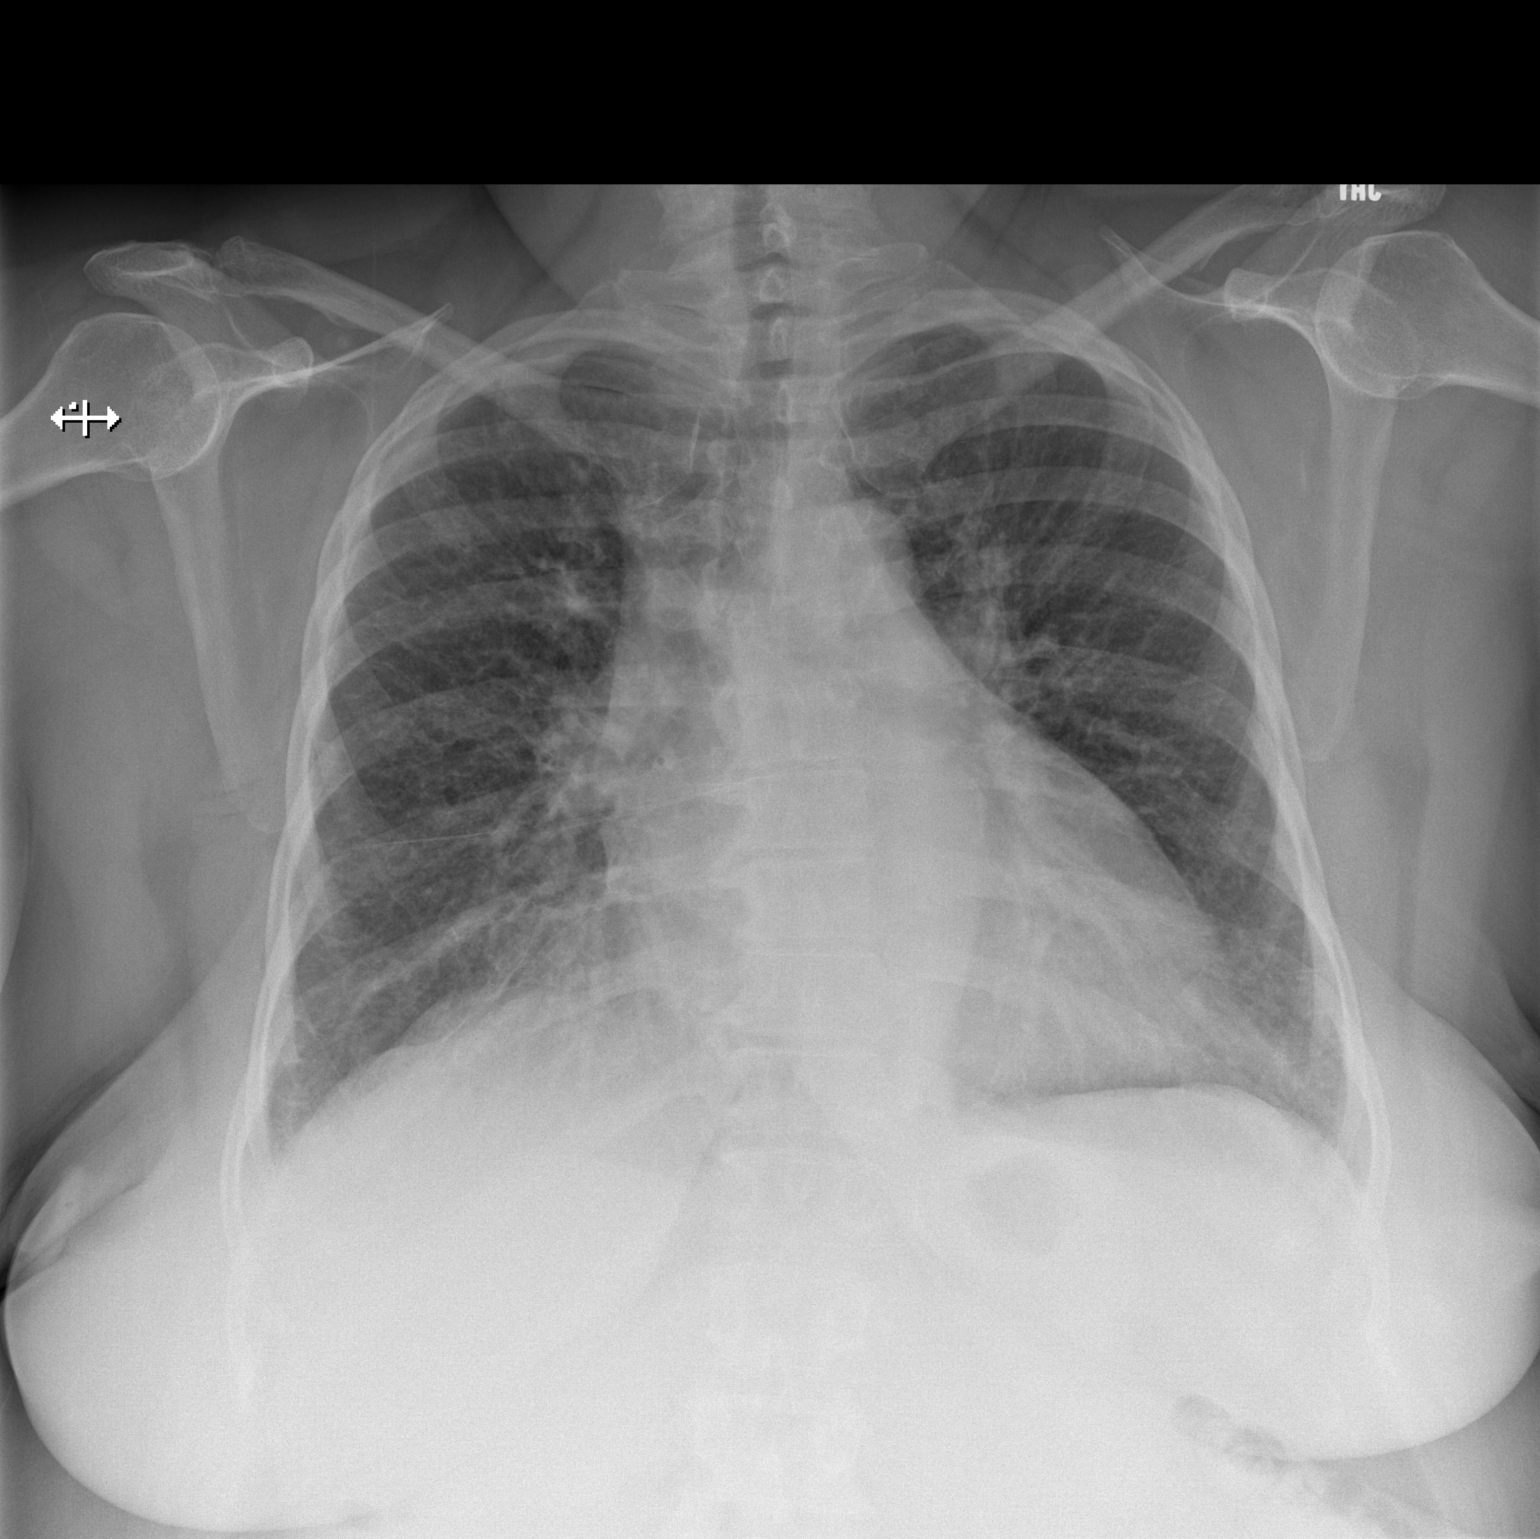

[w chest lat]
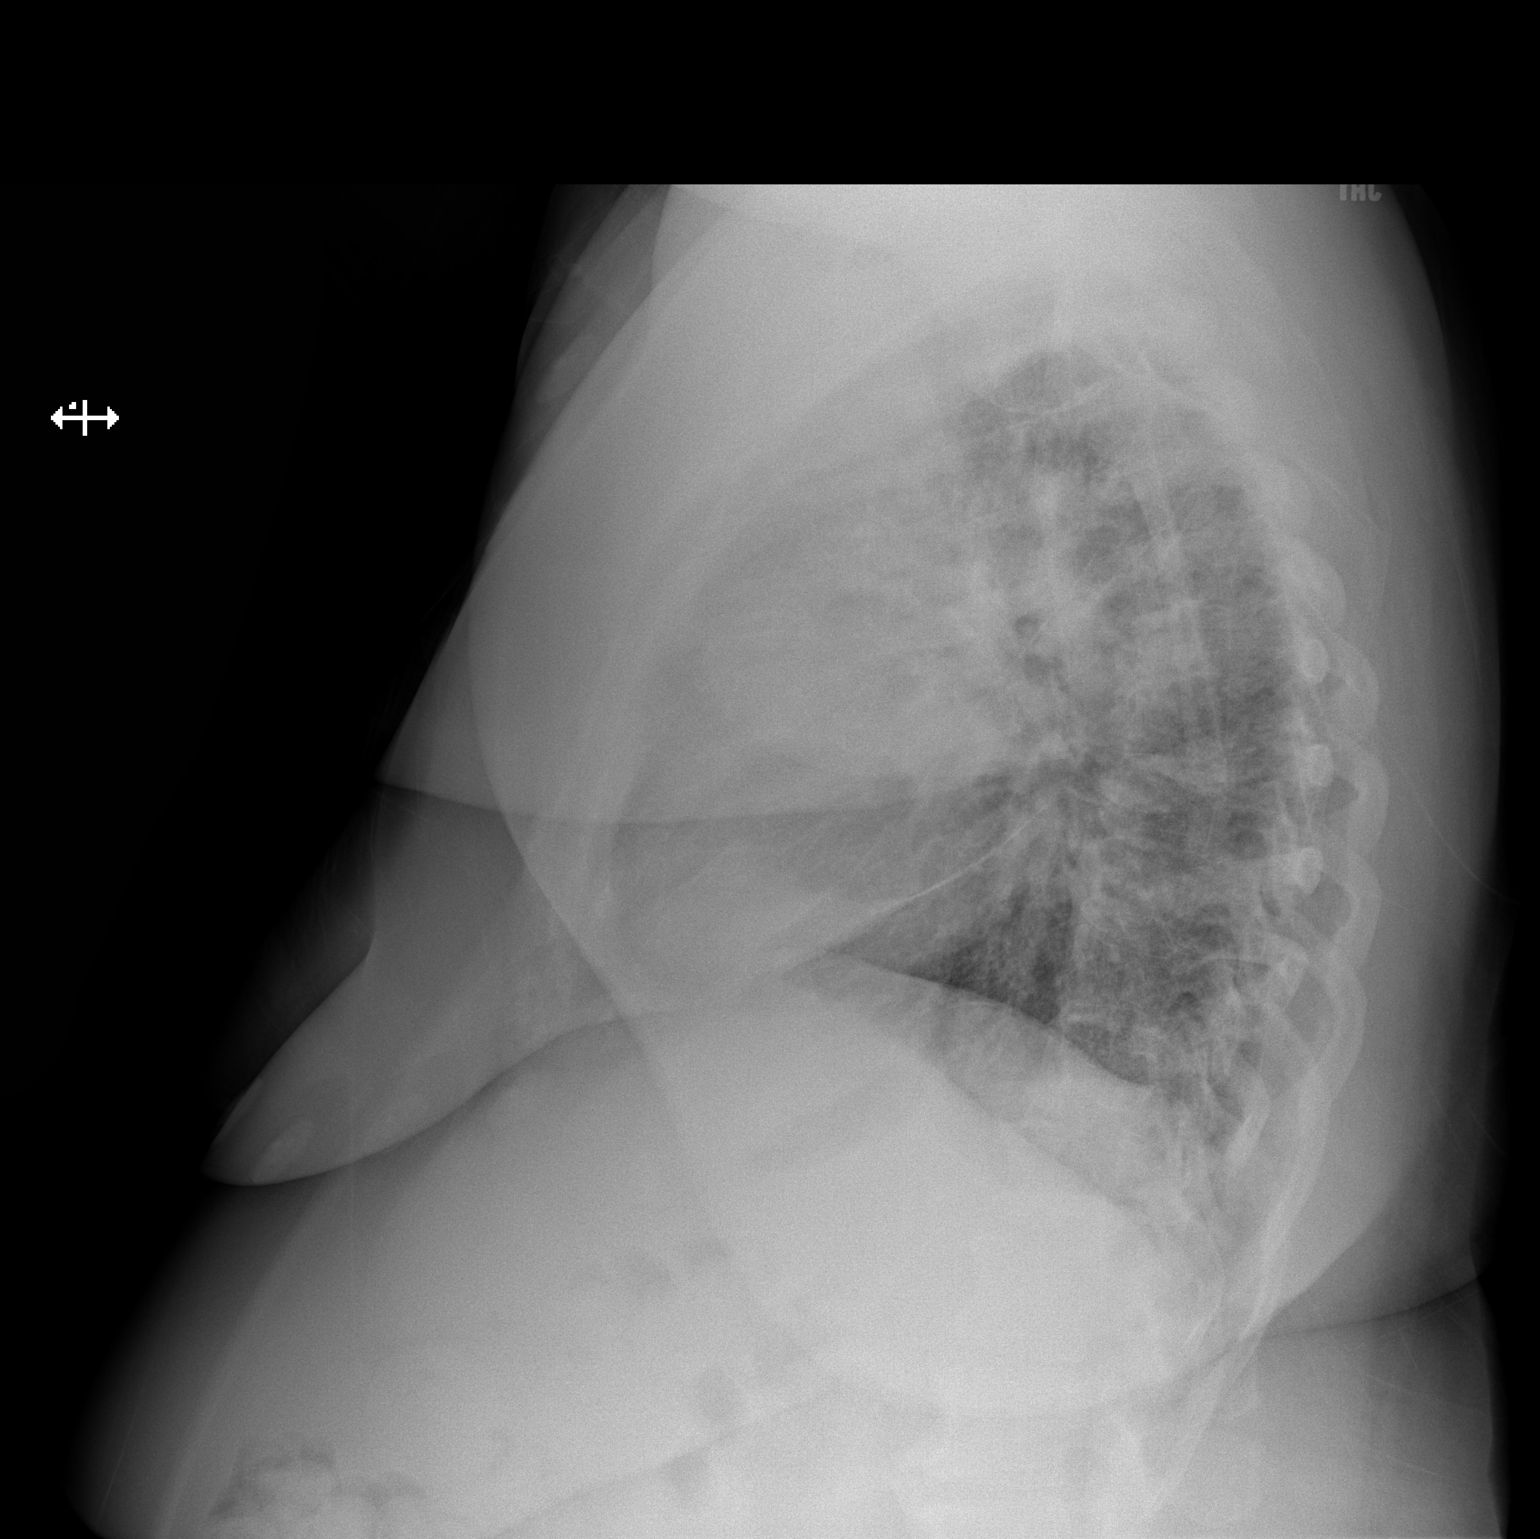

[2 of 2 positions shown; findings below may reference images not displayed]

FINDINGS: Peribronchial thickening and interstitial prominence. Heart is
normal size. No effusions or acute bony abnormality.
IMPRESSION: Bronchitic changes.

## 2022-01-20 IMAGING — CT CT ANGIO CHEST-ABD-PELV FOR DISSECTION W/ AND WO/W CM
2 of 7 series · 14 of 46 positions shown, 16 images · IV contrast (OMNIPAQUE 350)
Comparison: CT abdomen pelvis dated 02/28/2015.

CLINICAL DATA: 64-year-old female with chest and back pain. Concern
for aortic dissection.

EXAM:
CT ANGIOGRAPHY CHEST, ABDOMEN AND PELVIS
TECHNIQUE: Non-contrast CT of the chest was initially obtained.

[Series 6: axial arterial · axial · arterial · 0.74mm/px · z∈[+1130,+1638]mm · 11 of 195 slices shown, 13 images]
[im 13/195  soft-tissue]
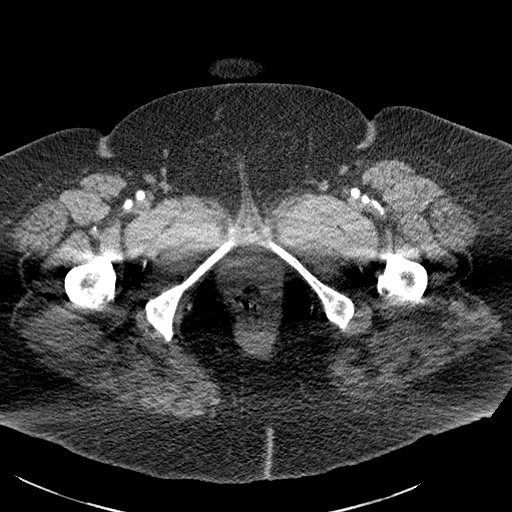
[im 13/195  bone]
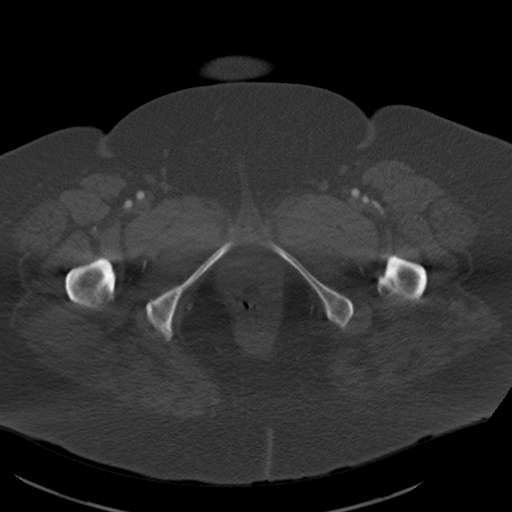
[im 37/195  soft-tissue]
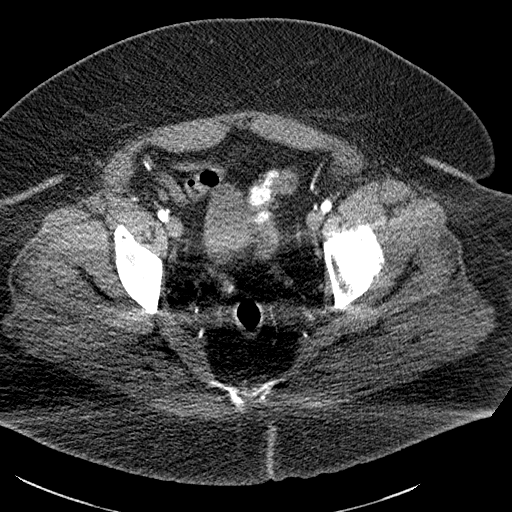
[im 49/195  soft-tissue]
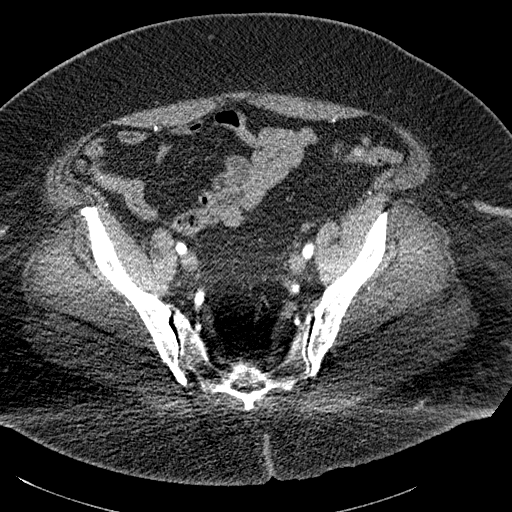
[im 61/195  soft-tissue]
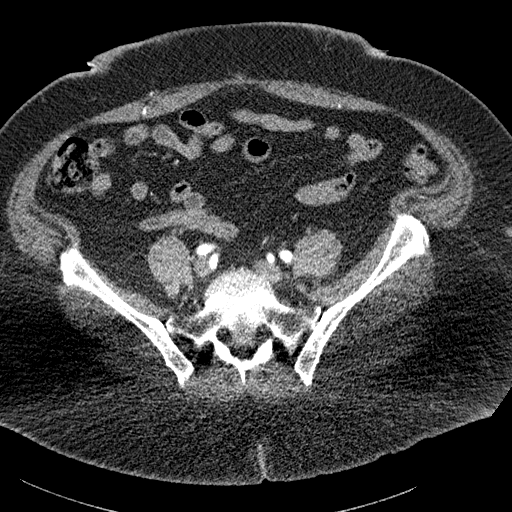
[im 85/195  soft-tissue]
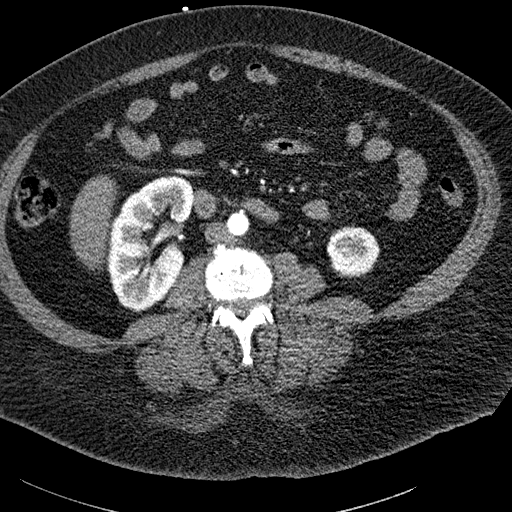
[im 98/195  soft-tissue]
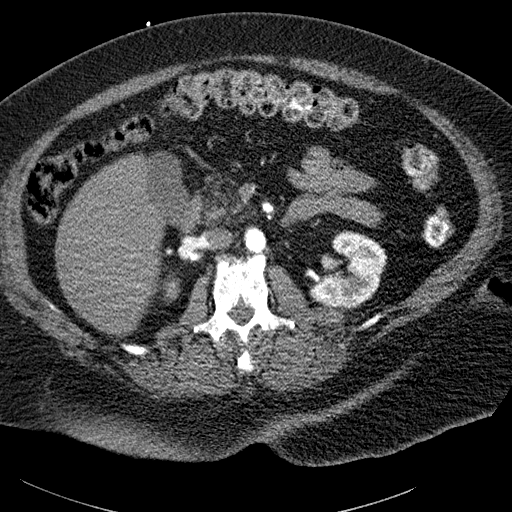
[im 110/195  soft-tissue]
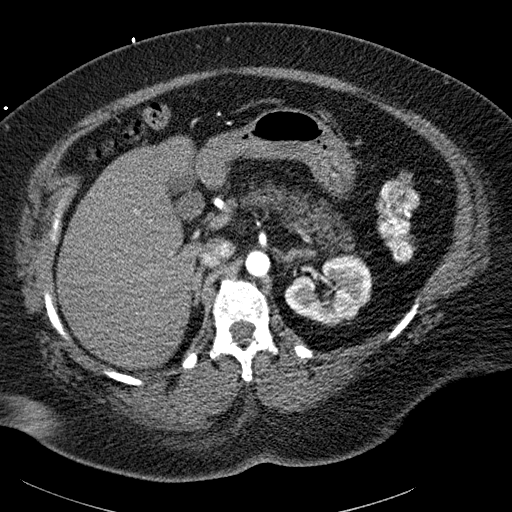
[im 134/195  soft-tissue]
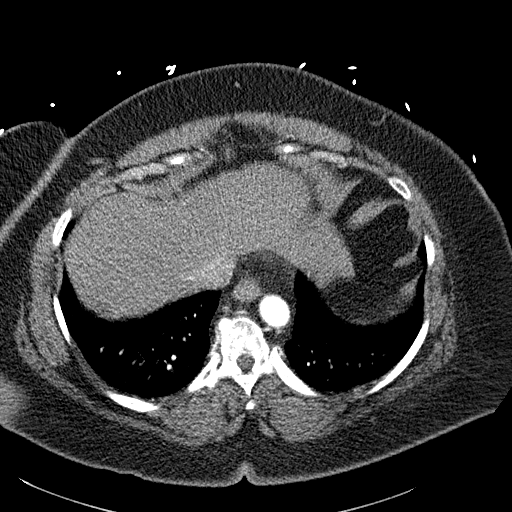
[im 146/195  soft-tissue]
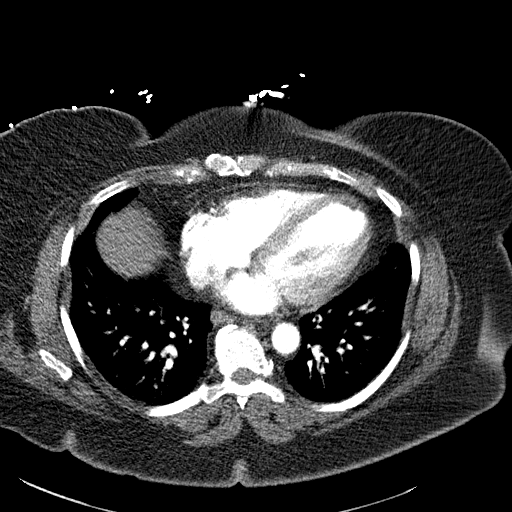
[im 146/195  bone]
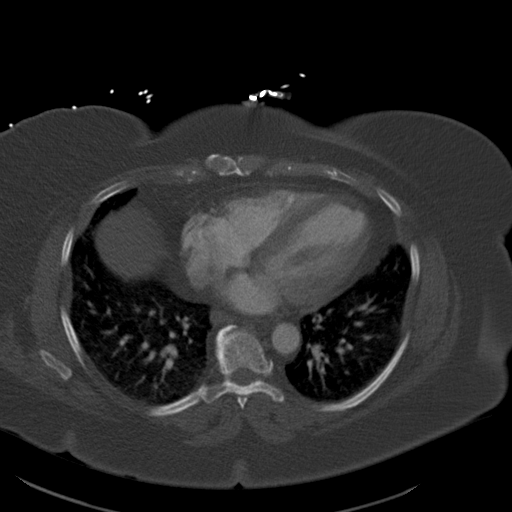
[im 158/195  soft-tissue]
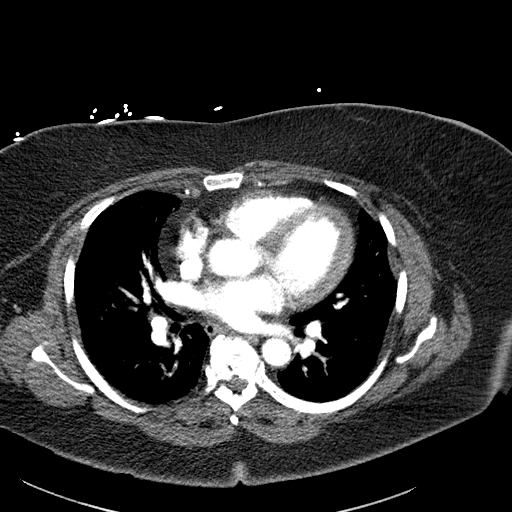
[im 182/195  soft-tissue]
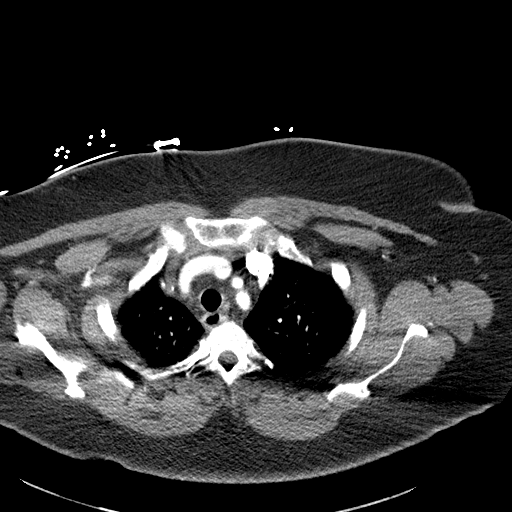

[Series 10: coronals · coronal · 0.82mm/px · 3 of 165 slices shown]
[im 42/165  soft-tissue]
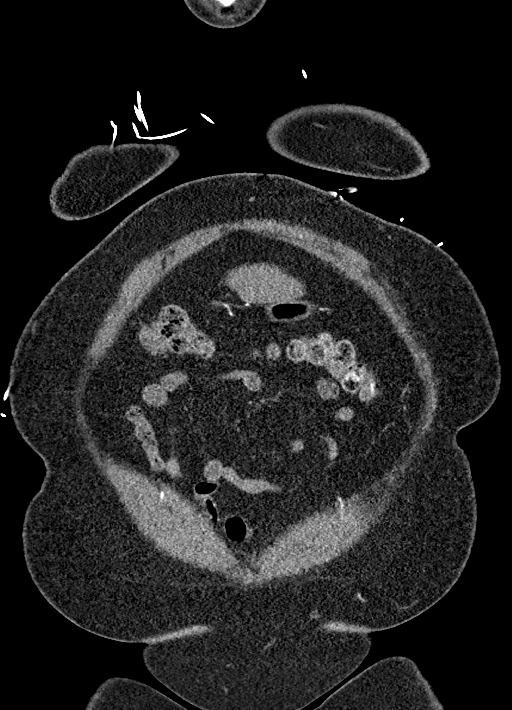
[im 83/165  soft-tissue]
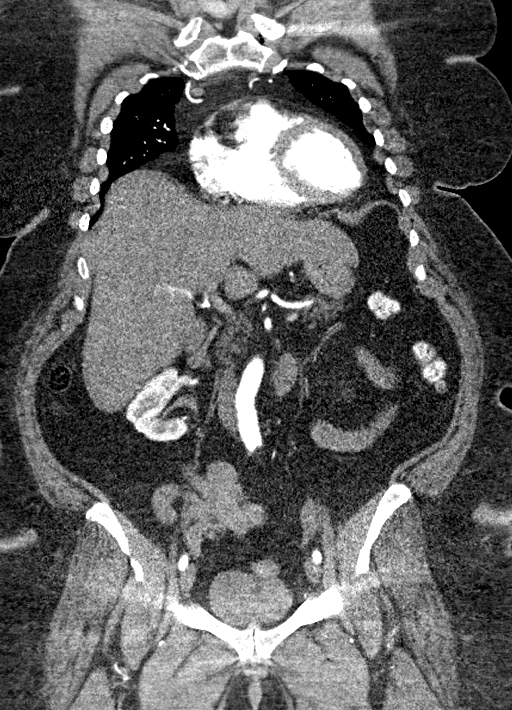
[im 124/165  soft-tissue]
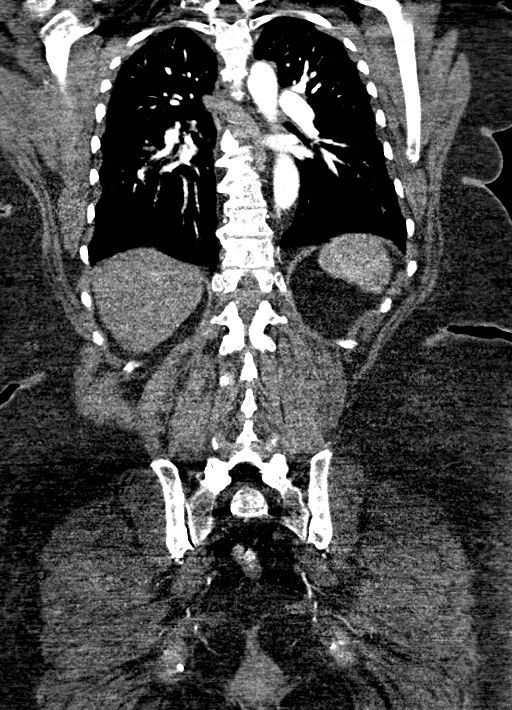

[14 of 46 positions shown; findings below may reference images not displayed]

Multidetector CT imaging through the chest, abdomen and pelvis was
performed using the standard protocol during bolus administration of
intravenous contrast. Multiplanar reconstructed images and MIPs were
obtained and reviewed to evaluate the vascular anatomy.

CONTRAST:  100mL OMNIPAQUE IOHEXOL 350 MG/ML SOLN
FINDINGS: Evaluation of this exam is limited due to respiratory motion
artifact.

CTA CHEST FINDINGS

Cardiovascular: There is no cardiomegaly or pericardial effusion.
The thoracic aorta is unremarkable. The origins of the great vessels
of the aortic arch appear patent as visualized. The central
pulmonary arteries are patent.

Mediastinum/Nodes: There is no hilar or mediastinal adenopathy. The
esophagus and the thyroid gland are grossly unremarkable as
visualized. No mediastinal fluid collection.

Lungs/Pleura: The lungs are clear. There is no pleural effusion or
pneumothorax. The central airways are patent.

Musculoskeletal: Degenerative changes of the spine. No acute osseous
pathology.

Review of the MIP images confirms the above findings.

CTA ABDOMEN AND PELVIS FINDINGS

VASCULAR

Aorta: Normal caliber aorta without aneurysm, dissection, vasculitis
or significant stenosis.

Celiac: Patent without evidence of aneurysm, dissection, vasculitis
or significant stenosis.

SMA: Patent without evidence of aneurysm, dissection, vasculitis or
significant stenosis.

Renals: Both renal arteries are patent without evidence of aneurysm,
dissection, vasculitis, fibromuscular dysplasia or significant
stenosis.

IMA: Patent without evidence of aneurysm, dissection, vasculitis or
significant stenosis.

Inflow: Mild atherosclerotic calcification of the iliac arteries. No
aneurysmal dilatation or dissection. The iliac arteries are patent.

Veins: No obvious venous abnormality within the limitations of this
arterial phase study.

Review of the MIP images confirms the above findings.

NON-VASCULAR

Hepatobiliary: The liver is unremarkable. No intrahepatic biliary
ductal dilatation. The gallbladder is unremarkable.

Pancreas: Unremarkable. No pancreatic ductal dilatation or
surrounding inflammatory changes.

Spleen: Normal in size without focal abnormality.

Adrenals/Urinary Tract: The adrenal glands are unremarkable. The
kidneys, and the visualized ureters appear unremarkable. The urinary
bladder is partially distended. There is a focus of air within the
bladder which may be related to recent instrumentation or secondary
to an infectious process. Clinical correlation is recommended.

Stomach/Bowel: There is sigmoid diverticulosis and scattered colonic
diverticula without active inflammatory changes. There is no bowel
obstruction or active inflammation. The appendix is normal.

Lymphatic: No adenopathy.

Reproductive: Hysterectomy. No adnexal masses.

Other: None

Musculoskeletal: Degenerative changes of the spine with multilevel
disc desiccation and vacuum phenomena. No acute osseous pathology.

Review of the MIP images confirms the above findings.
IMPRESSION: 1. No acute intrathoracic, abdominal, or pelvic pathology. No aortic
aneurysm or dissection.
2. Colonic diverticulosis.  No bowel obstruction. Normal appendix.
3. A focus of air within the bladder may be related to recent
instrumentation or secondary to an infectious process. Clinical
correlation is recommended.

## 2022-01-25 IMAGING — DX DG CHEST 1V PORT
1 series · 1 of 1 positions shown · non-contrast
Comparison: Chest radiographs and CT 03/01/2020, and earlier.

CLINICAL DATA: 64-year-old female status post CABG postoperative
day zero

EXAM:
PORTABLE CHEST 1 VIEW

[chest ap]
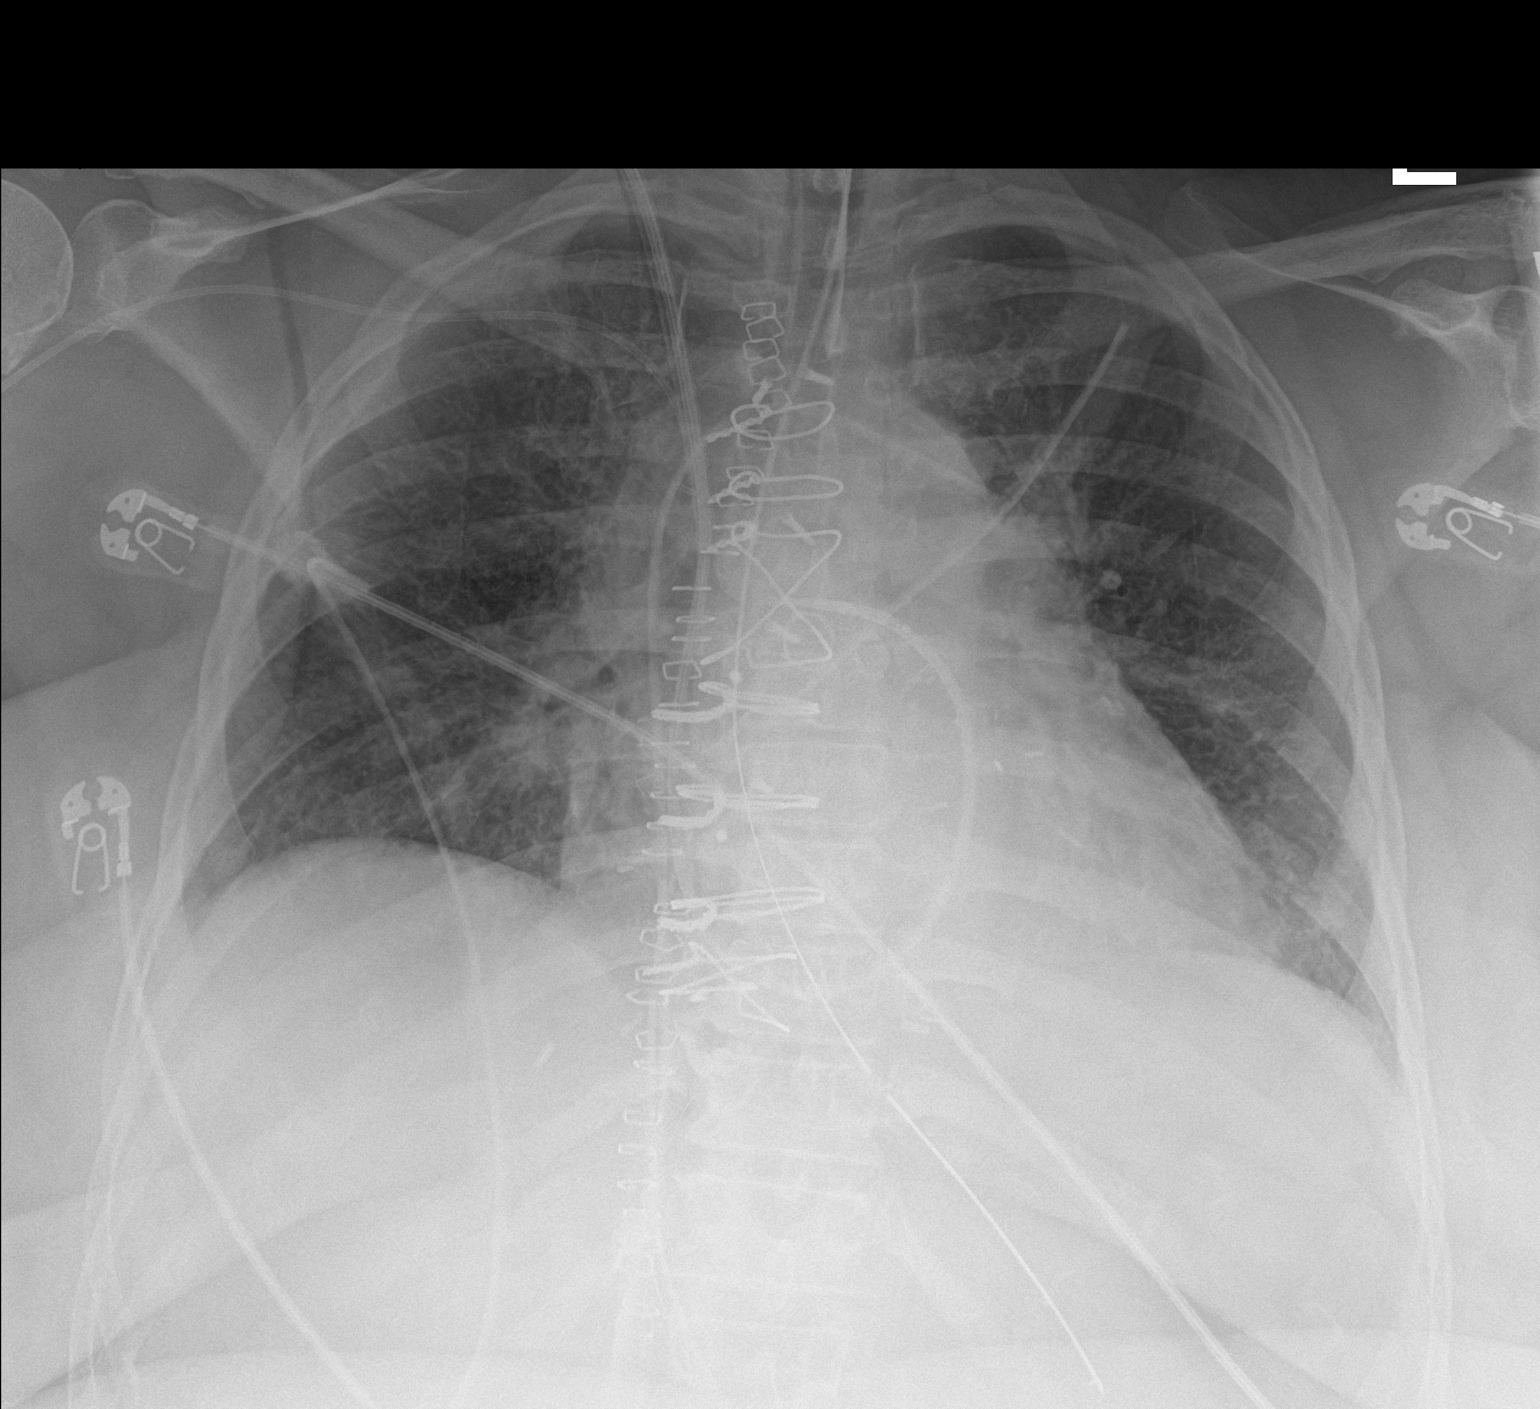

[1 of 1 positions shown; findings below may reference images not displayed]

FINDINGS: Portable AP semi upright view at 9819 hours. Intubated, endotracheal
tube tip at the level the clavicles. An enteric tube courses to the
abdomen, with side hole at the level of the gastric cardia. Right IJ
approach Swan-Ganz catheter in place with tip at the level of the
right main pulmonary artery. Superimposed right subclavian or right
upper extremity central line. Bilateral chest tubes.

Mildly lower lung volumes. Stable cardiac size and mediastinal
contours. Sequelae of CABG.

No pneumothorax. Stable pulmonary vascularity without overt edema.
No pleural effusion or consolidation.

Paucity of bowel gas in the upper abdomen.
IMPRESSION: 1. Status post CABG, lines and tubes appear appropriately placed as
detailed above.
2. No pneumothorax or acute pulmonary process.

## 2022-01-27 IMAGING — DX DG CHEST 1V PORT
1 series · 1 of 1 positions shown · non-contrast
Comparison: March 07, 2020

CLINICAL DATA: Status post coronary artery bypass grafting.
Extubation.

EXAM:
PORTABLE CHEST 1 VIEW

[chest]
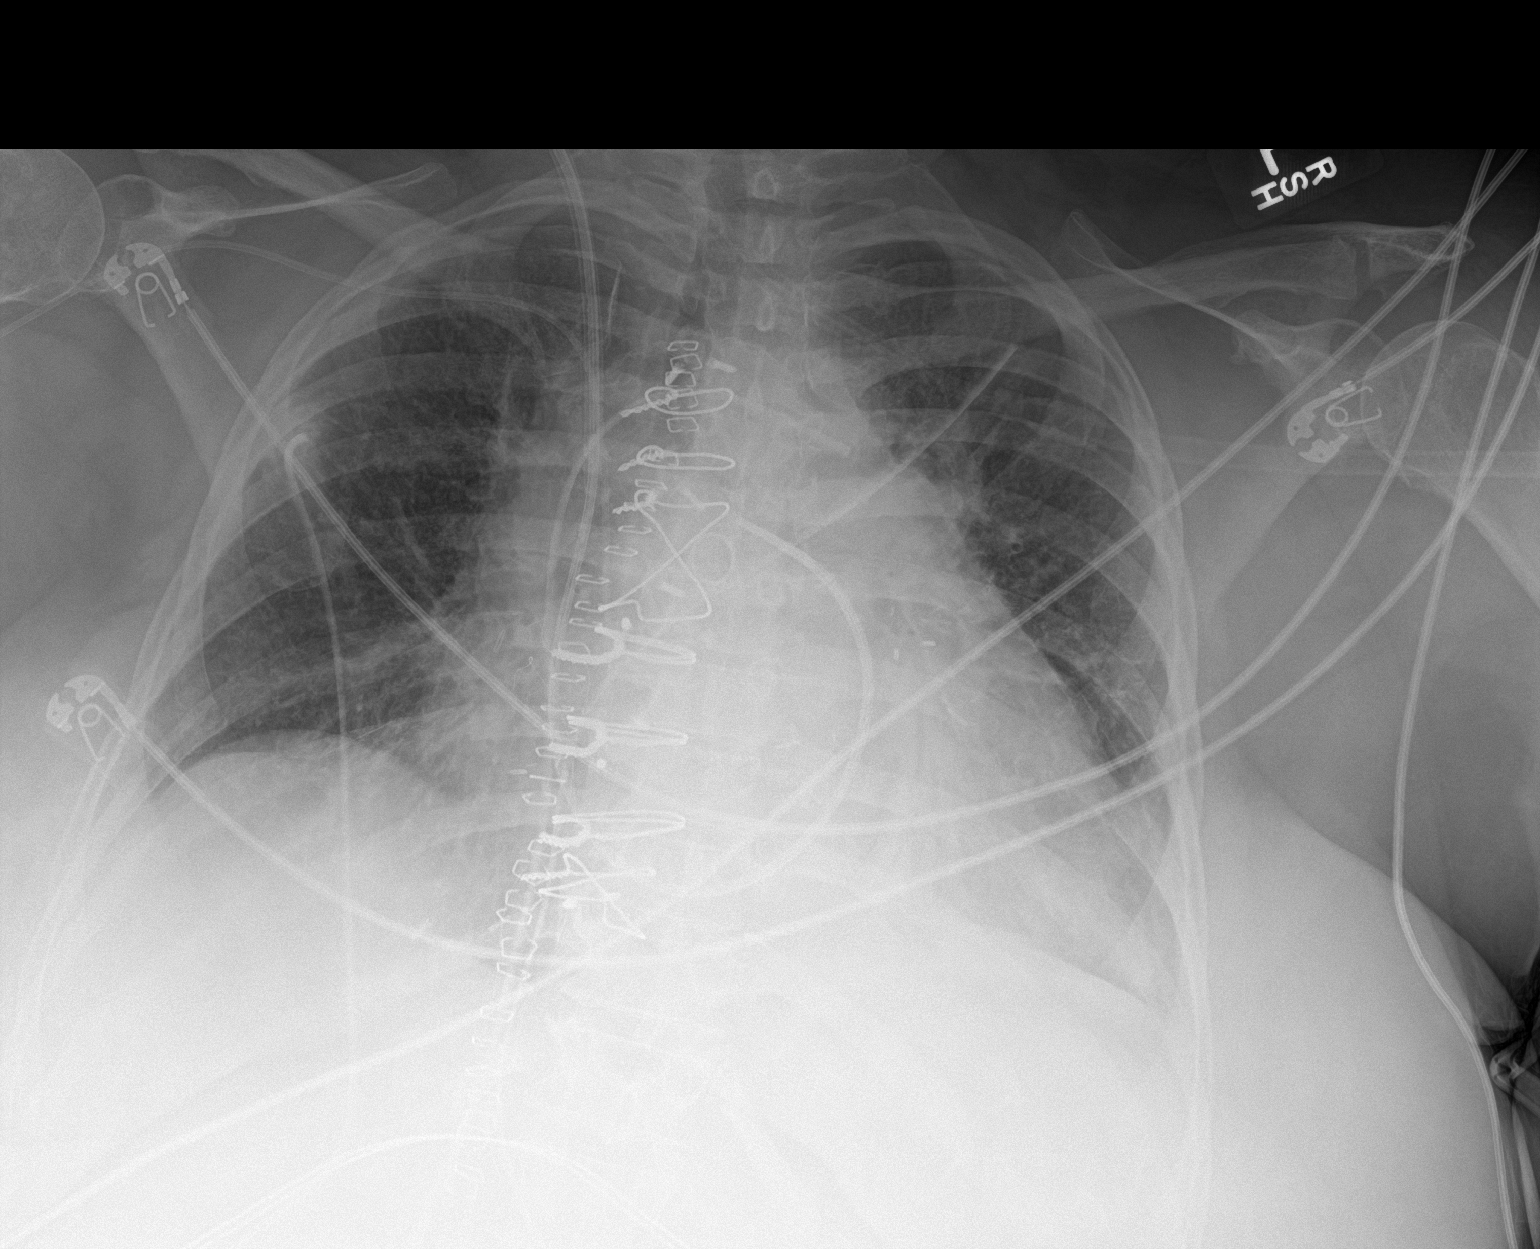

[1 of 1 positions shown; findings below may reference images not displayed]

FINDINGS: Endotracheal tube and nasogastric tube have been removed. Swan-Ganz
catheter tip is in the right main pulmonary artery, stable. There is
a mediastinal drain present, unchanged. There is a chest tube on
each side. No pneumothorax. There is mild bibasilar atelectasis.
There is no edema or airspace opacity. Heart is mildly enlarged,
stable. Patient is status post coronary artery bypass grafting.
Pulmonary vascularity is normal. No adenopathy. No bone lesions.
IMPRESSION: Tube and catheter positions as described without pneumothorax.
Bibasilar atelectasis. Lungs elsewhere clear. Stable cardiac
silhouette.

## 2022-04-21 ENCOUNTER — Other Ambulatory Visit (HOSPITAL_COMMUNITY): Payer: Self-pay

## 2022-04-23 ENCOUNTER — Other Ambulatory Visit (HOSPITAL_COMMUNITY): Payer: Self-pay

## 2022-08-18 ENCOUNTER — Emergency Department (HOSPITAL_COMMUNITY)
Admission: EM | Admit: 2022-08-18 | Discharge: 2022-08-23 | Disposition: E | Payer: Medicare HMO | Attending: Emergency Medicine | Admitting: Emergency Medicine

## 2022-08-18 DIAGNOSIS — Z7982 Long term (current) use of aspirin: Secondary | ICD-10-CM | POA: Diagnosis not present

## 2022-08-18 DIAGNOSIS — I469 Cardiac arrest, cause unspecified: Secondary | ICD-10-CM | POA: Diagnosis present

## 2022-08-18 NOTE — ED Provider Notes (Signed)
[facility] EMERGENCY DEPARTMENT Provider Note   CSN: [id number] Arrival date & time: 08/21/2022  2350     History  Chief Complaint  Patient presents with   CPR   Level 5 caveat due to acuity of condition Rhonda Roberts is a 66 y.o. female.  The history is provided by the EMS personnel. The history is limited by the condition of the patient.   Patient presents as a CPR in progress.  EMS reports that patient was found unresponsive and "foaming at the mouth" by family.  It is reported that she may have been treated for pneumonia recently. EMS reports the patient was intubated and multiple rounds of epinephrine were given, last return of spontaneous circulation around [place] Medications Prior to Admission medications   Medication Sig Start Date End Date Taking? Authorizing Provider  aspirin 81 MG chewable tablet Chew 81 mg by mouth daily.    [provider]  carvedilol (COREG) 12.5 MG tablet Take 1 tablet (12.5 mg total) by mouth 2 (two) times daily with a meal. 10/20/21   Skeet Latch, MD  Cholecalciferol (VITAMIN D) 2000 UNITS CAPS Take 2,000 Units by mouth every morning.     [provider]  Cyanocobalamin (VITAMIN B 12 PO) Take 1 tablet by mouth daily.     [provider]  empagliflozin (JARDIANCE) 10 MG TABS tablet Take 1 tablet (10 mg total) by mouth daily. 09/02/21   Larey Dresser, MD  insulin NPH Human (NOVOLIN N) 100 UNIT/ML injection Inject 80 Units into the skin daily. May do additional 80 units if evening blood sugar over 150    [provider]  Multiple Vitamin (MULTIVITAMIN WITH MINERALS) TABS Take by mouth every morning.     [provider]  rosuvastatin (CRESTOR) 40 MG tablet Take 1 tablet (40 mg total) by mouth daily. 06/17/20   Larey Dresser, MD  sacubitril-valsartan (ENTRESTO) 97-103 MG Take 1 tablet by mouth 2 (two) times daily. 07/17/21   Bensimhon, Shaune Pascal, MD  spironolactone  (ALDACTONE) 25 MG tablet Take 1 tablet (25 mg total) by mouth daily. 05/27/21   Larey Dresser, MD  Zinc 50 MG CAPS Take 1 capsule by mouth daily in the afternoon.    [provider]      Allergies    Sulfa antibiotics    Review of Systems   Review of Systems  Unable to perform ROS: Acuity of condition    Physical Exam Updated Vital Signs There were no vitals taken for this visit. Physical Exam CONSTITUTIONAL: Elderly, ill-appearing, unresponsive HEAD: Normocephalic/atraumatic no visible trauma EYES: Pupils are fixed and dilated ENMT: Mucous membranes moist, ET tube in place on arrival NECK: supple no meningeal signs CV: No spontaneous cardiac activity LUNGS: Equal breath sounds with bagging ABDOMEN: soft, obese NEURO: Pt is unresponsive EXTREMITIES: IO noted to right tibia SKIN: Skin is cool to touch PSYCH: Unable to assess  ED Results / Procedures / Treatments   Labs (all labs ordered are listed, but only abnormal results are displayed) Labs Reviewed - No data to display  EKG None  Radiology No results found.  Procedures Glidescope laryngoscopy  Date/Time: 08/26/2022 11:50 PM  Performed by: Ripley Fraise, MD Authorized by: Ripley Fraise, MD  Consent: The procedure was performed in an emergent situation. Patient tolerance: patient tolerated the procedure well with no immediate complications Comments: Patient arrived already intubated by paramedics.  I confirmed the intubation through the vocal cords with  video laryngoscopy.  No complications were noted   CPR  Date/Time: 09/04/2022 12:02 AM  Performed by: Ripley Fraise, MD Authorized by: Ripley Fraise, MD  CPR Procedure Details:    ACLS/BLS initiated by EMS: Yes     CPR/ACLS performed in the ED: Yes     Duration of CPR (minutes):  5   Outcome: Pt declared dead    CPR performed via ACLS guidelines under my direct supervision.  See RN documentation for details including defibrillator use,  medications, doses and timing.     Medications Ordered in ED Medications - No data to display  ED Course/ Medical Decision Making/ A&P                           Medical Decision Making  Patient seen on arrival as a CPR in progress.  Patient had been pulseless for up to 40 minutes prior to arrival.  I immediately confirmed ET tube placement through the vocal cords.  Patient was given an additional round of epinephrine and CPR was continued. Multiple pulse checks performed without return of spontaneous circulation. Asystole noted on the monitor.  Given length of time since last ROSC, patient pronounced dead at Feb 20, 2355 on Sep 04, 2023. Medical examiner will be consulted       Final Clinical Impression(s) / ED Diagnoses Final diagnoses:  Cardiac arrest [facility])    Rx / DC Orders ED Discharge Orders     None         Ripley Fraise, MD 09/04/22 0004

## 2022-08-19 MED ORDER — EPINEPHRINE 1 MG/10ML IJ SOSY
PREFILLED_SYRINGE | INTRAMUSCULAR | Status: AC | PRN
  Administered 2022-08-18: .1 mg via INTRAVENOUS

## 2022-08-23 NOTE — ED Triage Notes (Addendum)
PT arrived via [facility] for cardiac arrest from home. Daughter reported speaking with patient at 2220, no complaints and arrived home at 2230 to find patient on the ground, pulseless and apneic. EMS arrived to scene, CPR started at 2246. 7.5 ETT placed, yellow IO placed in right tibia. During code Intermittent return of pulses showed disorganized wide complex rhythm until last return of pulses at 2302. Last EMS pulse check at 2346, pt remained pulseless, asystole on the monitor. Total of 11 epis administered during code.   ETCO2 35-60

## 2022-08-23 NOTE — Progress Notes (Signed)
   2022-08-30 0028  Clinical Encounter Type  Visited With Patient and family together  Visit Type Initial;Death  Referral From Nurse  Consult/Referral To Chaplain    Chaplain Jorene Guest responded to the page to provide support to the family. I accompany the attending MD to consultation room A to inform the family of the patient's death. Provided grief and emotional support to the patient's daughter, Rhonda Roberts. Escorted the patient's brother, daughter and two cousins to say final good bye. Due to sudden death, the family did not have funeral home information. Gave Sheryl Eller(cousin) the Patient Placement card. Her number is [phone], [facility] [phone]. Family will call once funeral home has been selected. Prayer offered .This note was prepared by Jeanine Luz, M.Div..  For questions please contact by phone [phone].

## 2022-08-23 DEATH — deceased
